# Patient Record
Sex: Male | Born: 1940 | Race: Asian | Hispanic: No | Marital: Married | Smoking: Former smoker
Health system: Southern US, Community
[De-identification: ages and names within clinical notes are randomized; demographics above are authoritative.]

## PROBLEM LIST (undated history)

## (undated) DIAGNOSIS — N319 Neuromuscular dysfunction of bladder, unspecified: Secondary | ICD-10-CM

## (undated) DIAGNOSIS — J449 Chronic obstructive pulmonary disease, unspecified: Secondary | ICD-10-CM

## (undated) DIAGNOSIS — N390 Urinary tract infection, site not specified: Principal | ICD-10-CM

## (undated) DIAGNOSIS — G20A1 Parkinson's disease without dyskinesia, without mention of fluctuations: Secondary | ICD-10-CM

## (undated) DIAGNOSIS — E039 Hypothyroidism, unspecified: Secondary | ICD-10-CM

## (undated) DIAGNOSIS — I10 Essential (primary) hypertension: Secondary | ICD-10-CM

## (undated) DIAGNOSIS — G931 Anoxic brain damage, not elsewhere classified: Secondary | ICD-10-CM

## (undated) DIAGNOSIS — G20 Parkinson's disease: Secondary | ICD-10-CM

## (undated) DIAGNOSIS — E785 Hyperlipidemia, unspecified: Secondary | ICD-10-CM

## (undated) DIAGNOSIS — R8271 Bacteriuria: Secondary | ICD-10-CM

## (undated) HISTORY — DX: Urinary tract infection, site not specified: N39.0

## (undated) HISTORY — PX: APPENDECTOMY: SHX54

## (undated) HISTORY — PX: JOINT REPLACEMENT: SHX530

## (undated) HISTORY — DX: Bacteriuria: R82.71

## (undated) HISTORY — DX: Essential (primary) hypertension: I10

## (undated) HISTORY — DX: Anoxic brain damage, not elsewhere classified: G93.1

## (undated) HISTORY — PX: REPLACEMENT TOTAL KNEE BILATERAL: SUR1225

---

## 2015-12-20 ENCOUNTER — Inpatient Hospital Stay (HOSPITAL_COMMUNITY)
Admission: EM | Admit: 2015-12-20 | Discharge: 2015-12-23 | DRG: 690 | Disposition: A | Discharge status: Home Health | Payer: Medicare Other | Attending: Internal Medicine | Admitting: Internal Medicine

## 2015-12-20 ENCOUNTER — Emergency Department (HOSPITAL_COMMUNITY): Payer: Medicare Other

## 2015-12-20 ENCOUNTER — Encounter (HOSPITAL_COMMUNITY): Payer: Self-pay | Admitting: Emergency Medicine

## 2015-12-20 DIAGNOSIS — E860 Dehydration: Secondary | ICD-10-CM | POA: Diagnosis present

## 2015-12-20 DIAGNOSIS — Z79899 Other long term (current) drug therapy: Secondary | ICD-10-CM | POA: Diagnosis not present

## 2015-12-20 DIAGNOSIS — N319 Neuromuscular dysfunction of bladder, unspecified: Secondary | ICD-10-CM | POA: Diagnosis present

## 2015-12-20 DIAGNOSIS — I959 Hypotension, unspecified: Secondary | ICD-10-CM | POA: Diagnosis present

## 2015-12-20 DIAGNOSIS — B961 Klebsiella pneumoniae [K. pneumoniae] as the cause of diseases classified elsewhere: Secondary | ICD-10-CM | POA: Diagnosis present

## 2015-12-20 DIAGNOSIS — G20A1 Parkinson's disease without dyskinesia, without mention of fluctuations: Secondary | ICD-10-CM | POA: Diagnosis present

## 2015-12-20 DIAGNOSIS — Z87440 Personal history of urinary (tract) infections: Secondary | ICD-10-CM

## 2015-12-20 DIAGNOSIS — G20 Parkinson's disease: Secondary | ICD-10-CM | POA: Diagnosis not present

## 2015-12-20 DIAGNOSIS — Z87891 Personal history of nicotine dependence: Secondary | ICD-10-CM | POA: Diagnosis not present

## 2015-12-20 DIAGNOSIS — R531 Weakness: Secondary | ICD-10-CM

## 2015-12-20 DIAGNOSIS — N390 Urinary tract infection, site not specified: Principal | ICD-10-CM | POA: Diagnosis present

## 2015-12-20 DIAGNOSIS — J449 Chronic obstructive pulmonary disease, unspecified: Secondary | ICD-10-CM | POA: Diagnosis present

## 2015-12-20 DIAGNOSIS — Z7982 Long term (current) use of aspirin: Secondary | ICD-10-CM

## 2015-12-20 DIAGNOSIS — R262 Difficulty in walking, not elsewhere classified: Secondary | ICD-10-CM

## 2015-12-20 DIAGNOSIS — Z96653 Presence of artificial knee joint, bilateral: Secondary | ICD-10-CM | POA: Diagnosis present

## 2015-12-20 HISTORY — DX: Chronic obstructive pulmonary disease, unspecified: J44.9

## 2015-12-20 HISTORY — DX: Parkinson's disease without dyskinesia, without mention of fluctuations: G20.A1

## 2015-12-20 HISTORY — DX: Neuromuscular dysfunction of bladder, unspecified: N31.9

## 2015-12-20 HISTORY — DX: Parkinson's disease: G20

## 2015-12-20 LAB — URINE MICROSCOPIC-ADD ON

## 2015-12-20 LAB — URINALYSIS, ROUTINE W REFLEX MICROSCOPIC
BILIRUBIN URINE: NEGATIVE
Glucose, UA: NEGATIVE mg/dL
HGB URINE DIPSTICK: NEGATIVE
Ketones, ur: NEGATIVE mg/dL
NITRITE: POSITIVE — AB
PROTEIN: NEGATIVE mg/dL
SPECIFIC GRAVITY, URINE: 1.005 (ref 1.005–1.030)
pH: 6 (ref 5.0–8.0)

## 2015-12-20 LAB — COMPREHENSIVE METABOLIC PANEL
ALT: <5 U/L — ABNORMAL LOW (ref 17–63)
ANION GAP: 7 (ref 5–15)
AST: 17 U/L (ref 15–41)
Albumin: 3.6 g/dL (ref 3.5–5.0)
Alkaline Phosphatase: 78 U/L (ref 38–126)
BILIRUBIN TOTAL: 1.2 mg/dL (ref 0.3–1.2)
BUN: 29 mg/dL — AB (ref 6–20)
CALCIUM: 9.3 mg/dL (ref 8.9–10.3)
CO2: 26 mmol/L (ref 22–32)
CREATININE: 1.11 mg/dL (ref 0.61–1.24)
Chloride: 103 mmol/L (ref 101–111)
Glucose, Bld: 91 mg/dL (ref 65–99)
POTASSIUM: 4.4 mmol/L (ref 3.5–5.1)
Sodium: 136 mmol/L (ref 135–145)
TOTAL PROTEIN: 6.8 g/dL (ref 6.5–8.1)

## 2015-12-20 LAB — I-STAT CG4 LACTIC ACID, ED: LACTIC ACID, VENOUS: 0.6 mmol/L (ref 0.5–1.9)

## 2015-12-20 LAB — CBC WITH DIFFERENTIAL/PLATELET
BASOS ABS: 0 10*3/uL (ref 0.0–0.1)
BASOS PCT: 0 %
EOS ABS: 0.3 10*3/uL (ref 0.0–0.7)
Eosinophils Relative: 3 %
HEMATOCRIT: 32.1 % — AB (ref 39.0–52.0)
HEMOGLOBIN: 10.5 g/dL — AB (ref 13.0–17.0)
Lymphocytes Relative: 20 %
Lymphs Abs: 2.1 10*3/uL (ref 0.7–4.0)
MCH: 28.3 pg (ref 26.0–34.0)
MCHC: 32.7 g/dL (ref 30.0–36.0)
MCV: 86.5 fL (ref 78.0–100.0)
MONO ABS: 0.9 10*3/uL (ref 0.1–1.0)
Monocytes Relative: 8 %
NEUTROS ABS: 7.3 10*3/uL (ref 1.7–7.7)
NEUTROS PCT: 69 %
Platelets: 147 10*3/uL — ABNORMAL LOW (ref 150–400)
RBC: 3.71 MIL/uL — ABNORMAL LOW (ref 4.22–5.81)
RDW: 14.7 % (ref 11.5–15.5)
WBC: 10.5 10*3/uL (ref 4.0–10.5)

## 2015-12-20 LAB — CREATININE, SERUM: CREATININE: 0.96 mg/dL (ref 0.61–1.24)

## 2015-12-20 LAB — CBC
HCT: 28.9 % — ABNORMAL LOW (ref 39.0–52.0)
HEMOGLOBIN: 9.7 g/dL — AB (ref 13.0–17.0)
MCH: 28.3 pg (ref 26.0–34.0)
MCHC: 33.6 g/dL (ref 30.0–36.0)
MCV: 84.3 fL (ref 78.0–100.0)
Platelets: 122 10*3/uL — ABNORMAL LOW (ref 150–400)
RBC: 3.43 MIL/uL — AB (ref 4.22–5.81)
RDW: 14.4 % (ref 11.5–15.5)
WBC: 12.2 10*3/uL — AB (ref 4.0–10.5)

## 2015-12-20 MED ORDER — ENOXAPARIN SODIUM 40 MG/0.4ML ~~LOC~~ SOLN
40.0000 mg | SUBCUTANEOUS | Status: DC
  Administered 2015-12-20 – 2015-12-22 (×3): 40 mg via SUBCUTANEOUS
  Filled 2015-12-20 (×3): qty 0.4

## 2015-12-20 MED ORDER — SODIUM CHLORIDE 0.9 % IV BOLUS (SEPSIS)
1000.0000 mL | Freq: Once | INTRAVENOUS | Status: AC
  Administered 2015-12-20: 1000 mL via INTRAVENOUS

## 2015-12-20 MED ORDER — ATORVASTATIN CALCIUM 10 MG PO TABS
20.0000 mg | ORAL_TABLET | Freq: Every day | ORAL | Status: DC
  Administered 2015-12-21 – 2015-12-23 (×3): 20 mg via ORAL
  Filled 2015-12-20 (×2): qty 2
  Filled 2015-12-20: qty 1
  Filled 2015-12-20: qty 2

## 2015-12-20 MED ORDER — LEVOTHYROXINE SODIUM 88 MCG PO TABS
88.0000 ug | ORAL_TABLET | Freq: Every day | ORAL | Status: DC
  Administered 2015-12-21 – 2015-12-23 (×3): 88 ug via ORAL
  Filled 2015-12-20 (×3): qty 1

## 2015-12-20 MED ORDER — CARBIDOPA-LEVODOPA 25-100 MG PO TABS
2.0000 | ORAL_TABLET | Freq: Three times a day (TID) | ORAL | Status: DC
  Administered 2015-12-20 – 2015-12-23 (×8): 2 via ORAL
  Filled 2015-12-20 (×10): qty 2

## 2015-12-20 MED ORDER — SULFAMETHOXAZOLE-TRIMETHOPRIM 400-80 MG/5ML IV SOLN
10.0000 mg/kg/d | Freq: Four times a day (QID) | INTRAVENOUS | Status: DC
  Administered 2015-12-20 – 2015-12-22 (×7): 177.6 mg via INTRAVENOUS
  Filled 2015-12-20 (×8): qty 11.1

## 2015-12-20 MED ORDER — ACETAMINOPHEN 500 MG PO TABS
1000.0000 mg | ORAL_TABLET | Freq: Four times a day (QID) | ORAL | Status: DC | PRN
  Administered 2015-12-22: 1000 mg via ORAL
  Filled 2015-12-20: qty 2

## 2015-12-20 MED ORDER — ASPIRIN EC 81 MG PO TBEC
81.0000 mg | DELAYED_RELEASE_TABLET | Freq: Every day | ORAL | Status: DC
  Administered 2015-12-21 – 2015-12-23 (×3): 81 mg via ORAL
  Filled 2015-12-20 (×4): qty 1

## 2015-12-20 MED ORDER — DOCUSATE SODIUM 100 MG PO CAPS
100.0000 mg | ORAL_CAPSULE | Freq: Two times a day (BID) | ORAL | Status: DC
  Administered 2015-12-21 – 2015-12-23 (×5): 100 mg via ORAL
  Filled 2015-12-20 (×6): qty 1

## 2015-12-20 MED ORDER — FERROUS SULFATE 325 (65 FE) MG PO TABS
325.0000 mg | ORAL_TABLET | Freq: Every day | ORAL | Status: DC
  Administered 2015-12-21 – 2015-12-23 (×3): 325 mg via ORAL
  Filled 2015-12-20 (×3): qty 1

## 2015-12-20 MED ORDER — ADULT MULTIVITAMIN W/MINERALS CH
1.0000 | ORAL_TABLET | Freq: Every day | ORAL | Status: DC
  Administered 2015-12-21 – 2015-12-23 (×3): 1 via ORAL
  Filled 2015-12-20 (×4): qty 1

## 2015-12-20 MED ORDER — SODIUM CHLORIDE 0.9 % IV SOLN: INTRAVENOUS | Status: DC

## 2015-12-20 MED ORDER — ROPINIROLE HCL 1 MG PO TABS
1.0000 mg | ORAL_TABLET | Freq: Every day | ORAL | Status: DC
  Administered 2015-12-20 – 2015-12-22 (×3): 1 mg via ORAL
  Filled 2015-12-20 (×4): qty 1

## 2015-12-20 MED ORDER — TAMSULOSIN HCL 0.4 MG PO CAPS
0.4000 mg | ORAL_CAPSULE | Freq: Every day | ORAL | Status: DC
  Administered 2015-12-20 – 2015-12-23 (×4): 0.4 mg via ORAL
  Filled 2015-12-20 (×3): qty 1

## 2015-12-20 MED ORDER — GABAPENTIN 100 MG PO CAPS
200.0000 mg | ORAL_CAPSULE | Freq: Every day | ORAL | Status: DC
  Administered 2015-12-20 – 2015-12-23 (×4): 200 mg via ORAL
  Filled 2015-12-20 (×4): qty 2

## 2015-12-20 MED ORDER — CITALOPRAM HYDROBROMIDE 20 MG PO TABS
20.0000 mg | ORAL_TABLET | Freq: Every day | ORAL | Status: DC
  Administered 2015-12-20 – 2015-12-23 (×4): 20 mg via ORAL
  Filled 2015-12-20 (×2): qty 1
  Filled 2015-12-20: qty 2
  Filled 2015-12-20: qty 1

## 2015-12-20 MED ORDER — SODIUM CHLORIDE 0.9 % IV SOLN
INTRAVENOUS | Status: AC
  Administered 2015-12-20 – 2015-12-21 (×3): via INTRAVENOUS

## 2015-12-20 MED ORDER — DEXTROSE 5 % IV SOLN
1.0000 g | Freq: Once | INTRAVENOUS | Status: AC
  Administered 2015-12-20: 1 g via INTRAVENOUS
  Filled 2015-12-20: qty 10

## 2015-12-20 MED ORDER — ROPINIROLE HCL 1 MG PO TABS
1.5000 mg | ORAL_TABLET | Freq: Every day | ORAL | Status: DC
  Administered 2015-12-20 – 2015-12-22 (×3): 1.5 mg via ORAL
  Filled 2015-12-20 (×3): qty 1

## 2015-12-20 NOTE — H&P (Signed)
History and Physical  Bryan Vega ZOX:[id number] DOB: 04-Dec-1940 DOA: 12/20/2015  Referring physician: Jeraldine Loots, MD Urologist: Dr. Quintella Vega, [place]  Chief Complaint: weakness  HPI: Bryan Vega is a 75 y.o. male The patient has a notable history of Parkinson's disease with a neurogenic bladder as well as dependent catheterization twice daily for urination. He has a history of multiple UTIs most recently 2 weeks ago where he was treated by his urologist in [facility] Dr Bryan Vega with 10 days of macrobid.  He has a history of MDR e coli infections.  Family states that over the past day he has become less interactive and less verbal.  He also notes increasing generalized weakness, including one event during which the patient was too weak to rise from bed, and slid to the floor.  No trauma and the patient's wife witnessed the entire event, assisting him to the floor.  The patient himself denies head pain, neck pain, chest pain, belly pain, discomfort.   He does acknowledge weakness. He was noted to have low blood pressure in ED and cloudy urine that appears infected. He was started on IVF hydration and antibiotics.  He has a history of severe sepsis from UTI in the past requiring ICU care.  He was initially given ceftriaxone but family reports that it usually does not work for his infections.  Urine culture results from [facility] reveal multi-drug resistant E coli that responded to macrobid and bactrim. He is being admitted for further observation and management given high risk for decompensation.   Review of Systems: All systems reviewed and apart from history of presenting illness, are negative.  Past Medical History:  Diagnosis Date  . COPD (chronic obstructive pulmonary disease) (HCC)   . Parkinson's disease [facility])    Past Surgical History:  Procedure Laterality Date  . APPENDECTOMY    . JOINT REPLACEMENT    . 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 His smoking use included Cigarettes. He has a 10.00 pack-year smoking history. He has never used smokeless tobacco. He reports that he drinks alcohol. He reports that he does not use drugs.   No Known Allergies  History reviewed. No pertinent family history.   Prior to Admission medications   Medication Sig Start Date End Date Taking? Authorizing Provider  acetaminophen (TYLENOL) 500 MG tablet Take 1,000 mg by mouth every 6 (six) hours as needed for fever.   Yes Historical Provider, MD  aspirin EC 81 MG tablet Take 81 mg by mouth daily.   Yes Historical Provider, MD  atorvastatin (LIPITOR) 20 MG tablet Take 20 mg by mouth daily.   Yes Historical Provider, MD  CALCIUM PO Take 1 tablet by mouth daily.   Yes Historical Provider, MD  carbidopa-levodopa (SINEMET IR) 25-100 MG tablet Take 2 tablets by mouth 3 (three) times daily.   Yes Historical Provider, MD  citalopram (CELEXA) 20 MG tablet Take 20 mg by mouth daily.   Yes Historical Provider, MD  docusate sodium (COLACE) 100 MG capsule Take 100 mg by mouth 2 (two) times daily.   Yes Historical Provider, MD  ferrous sulfate 325 (65 FE) MG tablet Take 325 mg by mouth daily with breakfast.   Yes Historical Provider, MD  gabapentin (NEURONTIN) 100 MG capsule Take 200 mg by mouth daily.   Yes Historical Provider, MD  levothyroxine (SYNTHROID, LEVOTHROID) 88 MCG tablet Take 88 mcg by mouth  daily before breakfast.   Yes Historical Provider, MD  Multiple Vitamins-Minerals (MULTIVITAMIN GUMMIES ADULT PO) Take 1 tablet by mouth daily.   Yes Historical Provider, MD  Omega-3 Fatty Acids (FISH OIL PO) Take 1 capsule by mouth daily.   Yes Historical Provider, MD  rOPINIRole (REQUIP) 0.5 MG tablet Take 1-1.5 mg by mouth 2 (two) times daily. Take 1mg  in the evening and 1.5mg s at bedtime   Yes Historical Provider, MD  tamsulosin (FLOMAX) 0.4 MG CAPS capsule Take 0.4 mg by mouth daily.   Yes Historical Provider, MD     Physical Exam: Vitals:   12/20/15 0853 12/20/15 1109 12/20/15 1200 12/20/15 1230  BP:  (!) 98/51 (!) 119/53   Pulse:  67 61 62  Resp:  22 21 21   Temp:  97.8 F (36.6 C)    TempSrc:  Oral    SpO2: 100% 97% 99% 100%  Weight: 71.2 kg (157 lb)     Height:       Constitutional: He is oriented to person, place, and time. He has a chronically ill appearance.  HENT: Head: Normocephalic and atraumatic. MM dry. Eyes: Conjunctivae and EOM are normal.  Cardiovascular: Normal rate and regular rhythm.   Pulmonary/Chest: Effort normal. No stridor. No respiratory distress.  Abdominal: He exhibits no distension.  Musculoskeletal: He exhibits edema. Bilateral LE edema.  Neurological: He is alert and oriented to person, place, and time. He displays atrophy. He displays no tremor. He exhibits abnormal muscle tone.  Skin: Skin is warm and dry.  Psychiatric: He has a flat affect. He is slowed. Cognition and memory are not impaired.    Labs on Admission:  Basic Metabolic Panel:  Recent Labs Lab 12/20/15 1009  NA 136  K 4.4  CL 103  CO2 26  GLUCOSE 91  BUN 29*  CREATININE 1.11  CALCIUM 9.3   Liver Function Tests:  Recent Labs Lab 12/20/15 1009  AST 17  ALT <5*  ALKPHOS 78  BILITOT 1.2  PROT 6.8  ALBUMIN 3.6   No results for input(s): LIPASE, AMYLASE in the last 168 hours. No results for input(s): AMMONIA in the last 168 hours. CBC:  Recent Labs Lab 12/20/15 1009  WBC 10.5  NEUTROABS 7.3  HGB 10.5*  HCT 32.1*  MCV 86.5  PLT 147*   Cardiac Enzymes: No results for input(s): CKTOTAL, CKMB, CKMBINDEX, TROPONINI in the last 168 hours.  BNP (last 3 results) No results for input(s): PROBNP in the last 8760 hours. CBG: No results for input(s): GLUCAP in the last 168 hours.  Radiological Exams on Admission: Dg Chest 2 View  Result Date: 12/20/2015 CLINICAL DATA:  Fever.  Fall 1 day prior EXAM: CHEST  2 VIEW COMPARISON:  None. FINDINGS: Lungs are clear. Heart is upper  normal in size with pulmonary vascularity within normal limits. No pneumothorax. No adenopathy. There is degenerative change in the thoracic spine. IMPRESSION: No edema or consolidation. Electronically Signed   By: Bretta BangWilliam  Woodruff III M.D.   On: 12/20/2015 10:11   EKG: Independently reviewed.  Assessment/Plan Principal Problem:   UTI (lower urinary tract infection) Active Problems:   Hypotension   Generalized weakness   Parkinson disease (HCC)  1. Multidrug-resistant UTI-this is presumed to be a treatable complicated infection given that patient is having symptoms of early sepsis with hypotension and tachycardia. His urine does appear to be infected. Urine culture has been sent. Blood cultures have been ordered. He was given ceftriaxone but after talking with the family I  agree that the ceftriaxone may not be effective here. I reviewed some old urine cultures and this resistant Escherichia coli had been sensitive to Macrobid and Bactrim in the past. He apparently did complete a 10 day course of Macrobid outpatient 2 weeks ago. Will try IV Bactrim pending urine culture results. Continue supportive care with aggressive IV fluid hydration. Lactic acid within normal limits. Blood pressure is improving with IV fluid hydration. 2. Hypotension-likely secondary to dehydration associated with the urinary tract infection. He does not appear to be septic at this time but family is very concerned that he is not himself. He has high risk for sepsis. Follow blood and urine cultures.  3. Parkinson's disease with neurogenic bladder-continue in and out catheterization. Continue home medications for Parkinson's disease. 4. Generalized weakness-suspect this is secondary to #1 above. Treating as noted. Reassess in the morning. 5. Dehydration - IV fluid hydration ordered and clinically improving.   DVT Prophylaxis: enoxaparin Code Status: FULL  Family Communication: bedside daughter, son-in law  Disposition Plan:  Anticipated return to previous home environment in 1-2 days   Standley Dakins, MD [facility] Pager [phone]  If 7PM-7AM, please contact night-coverage [email or web address] Password [email or web address] 12/20/2015, 1:23 PM

## 2015-12-20 NOTE — Care Management Note (Signed)
Case Management Note  Patient Details  Name: Bryan Vega MRN: [id number] Date of Birth: 08/04/1940  Subjective/Objective:                  75 yo male who family states that over the past day he has become less interactive, is verbal. He also notes increasing generalized weakness, including one event during which the patient was too weak to rise from bed, and slid to the floor.  Action/Plan: Follow for disposition needs. /DME hospital bed.   Expected Discharge Date:  12/20/15               Expected Discharge Plan:  Home w Home Health Services  In-House Referral:  NA  Discharge planning Services  CM Consult  Post Acute Care Choice:  Durable Medical Equipment Choice offered to:  Adult Children  DME Arranged:  Hospital bed DME Agency:     HH Arranged:  NA HH Agency:  NA  Status of Service:  Completed, signed off  If discussed at Long Length of Stay Meetings, dates discussed:    Additional Comments: Bryan Cohnamellia [name], RN, BSN, [place] [phone] Pt qualifies for DME hospital bed.  DME  ordered through [facility].  [facility]  EDCM spoke with Dr Jeraldine LootsLockwood to place order for DME hospital bed.  Bryan CohnWood, [name], RN 12/20/2015, 10:52 AM

## 2015-12-20 NOTE — Progress Notes (Signed)
Discussed admission status with admitting MD.  Orders placed

## 2015-12-20 NOTE — ED Triage Notes (Addendum)
Patient is from home.  Family members states patient was Septic 2 months ago.  He is complaining of a fever. Family states he has been sleeping more and progressively getting weaker. Family administered 1000 mg of Tylenol at 07:15 am.    Patient denies n/v/d, and cough. Patient has a history of Parkinson's.  Patient slid of bed (did not hit head) and laid on floor for 5 hours  BP:130/66 P:70 R:20 98% on room air

## 2015-12-20 NOTE — ED Notes (Signed)
Unable to collect labs patient is out of the room 

## 2015-12-20 NOTE — ED Provider Notes (Signed)
[facility] DEPT Provider Note   CSN: [id number] Arrival date & time: 12/20/15  0831     History   Chief Complaint Chief Complaint  Patient presents with  . Fever    HPI Normal Stollings is a 75 y.o. male.  HPI  Patient presents with family members to relate the history of present illness. Patient is Vega, Bryan Vega, Bryan Vega, Bryan Vega, Bryan Vega. The patient has a notable history of Parkinson's disease, as well as dependent catheterization, twice daily for urination. Family states that over the past day he has become less interactive, is verbal. He also notes increasing generalized weakness, including one event during which the patient was too weak to rise from bed, and slid to the floor. No trauma, and the patient's wife witnessed the entire event, assisting him to the floor. The patient himself denies head pain, neck pain, chest pain, belly pain, discomfort. He does acknowledge weakness.  Family states that the patient has recent episode of sepsis, prolonged hospitalization.    Past Medical History:  Diagnosis Date  . COPD (chronic obstructive pulmonary disease) (HCC)   . Parkinson's disease (HCC)     There are no active problems to display for this patient.   Past Surgical History:  Procedure Laterality Date  . APPENDECTOMY    . JOINT REPLACEMENT    . 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 Authorizing Provider  acetaminophen (TYLENOL) 500 MG tablet Take 1,000 mg by mouth every 6 (six) hours as needed for fever.   Yes Historical Provider, MD  aspirin EC 81 MG tablet Take 81 mg by mouth daily.   Yes Historical Provider, MD  atorvastatin (LIPITOR) 20 MG tablet Take 20 mg by mouth daily.   Yes Historical Provider, MD  CALCIUM PO Take 1 tablet by mouth daily.   Yes Historical Provider, MD    carbidopa-levodopa (SINEMET IR) 25-100 MG tablet Take 2 tablets by mouth 3 (three) times daily.   Yes Historical Provider, MD  CITALOPRAM HYDROBROMIDE PO Take 1 tablet by mouth daily.   Yes Historical Provider, MD  docusate sodium (COLACE) 100 MG capsule Take 100 mg by mouth 2 (two) times daily.   Yes Historical Provider, MD  ferrous sulfate 325 (65 FE) MG tablet Take 325 mg by mouth daily with breakfast.   Yes Historical Provider, MD  gabapentin (NEURONTIN) 100 MG capsule Take 200 mg by mouth daily.   Yes Historical Provider, MD  levothyroxine (SYNTHROID, LEVOTHROID) 88 MCG tablet Take 88 mcg by mouth daily before breakfast.   Yes Historical Provider, MD  Multiple Vitamins-Minerals (MULTIVITAMIN GUMMIES ADULT PO) Take 1 tablet by mouth daily.   Yes Historical Provider, MD  Omega-3 Fatty Acids (FISH OIL PO) Take 1 capsule by mouth daily.   Yes Historical Provider, MD  rOPINIRole (REQUIP) 0.5 MG tablet Take 1-1.5 mg by mouth 2 (two) times daily. Take 1mg  in the evening and 1.5mg s at bedtime   Yes Historical Provider, MD  tamsulosin (FLOMAX) 0.4 MG CAPS capsule Take 0.4 mg by mouth daily.   Yes Historical Provider, MD    Family History No family history on file.  Social History Social History  Substance Use Topics  . Smoking status: Former Smoker    Packs/day: 0.50    Years: 20.00    Types: Cigarettes    Quit  date: 04/20/1996  . Smokeless tobacco: Never Used  . Alcohol use Yes     Allergies   Review of patient's allergies indicates no known allergies.   Review of Systems Review of Systems  Constitutional:       Per HPI, otherwise negative  HENT:       Per HPI, otherwise negative  Respiratory:       Per HPI, otherwise negative  Cardiovascular:       Per HPI, otherwise negative  Gastrointestinal: Positive for nausea. Negative for vomiting.  Endocrine:       Negative aside from HPI  Genitourinary:       Neg aside from HPI   Musculoskeletal:       Per HPI, otherwise  negative  Skin: Negative for wound.  Neurological: Positive for weakness. Negative for syncope.  Psychiatric/Behavioral: Positive for decreased concentration.     Physical Exam Updated Vital Signs BP 119/58 (BP Location: Left Arm)   Pulse 79   Temp 98.3 F (36.8 C) (Oral)   Resp 15   Ht 5\' 7"  (1.702 m)   Wt 157 lb (71.2 kg)   SpO2 100%   BMI 24.59 kg/m   Physical Exam  Constitutional: He is oriented to person, place, and time. He has a sickly appearance.  HENT:  Head: Normocephalic and atraumatic.  Eyes: Conjunctivae and EOM are normal.  Cardiovascular: Normal rate and regular rhythm.   Pulmonary/Chest: Effort normal. No stridor. No respiratory distress.  Abdominal: He exhibits no distension.  Musculoskeletal: He exhibits edema.  Bilateral LE edema.   Neurological: He is Bryan Vega and oriented to person, place, and time. He displays atrophy. He displays no tremor. He exhibits abnormal muscle tone.  Skin: Skin is warm and dry.  Psychiatric: He has a normal mood and affect. He is slowed. Cognition and memory are not impaired.  Nursing note and vitals reviewed.    ED Treatments / Results  Labs (all labs ordered are listed, Bryan only abnormal results are displayed) Labs Reviewed  COMPREHENSIVE METABOLIC PANEL - Abnormal; Notable for the following:       Result Value   BUN 29 (*)    ALT <5 (*)    All other components within normal limits  CBC WITH DIFFERENTIAL/PLATELET - Abnormal; Notable for the following:    RBC 3.71 (*)    Hemoglobin 10.5 (*)    HCT 32.1 (*)    Platelets 147 (*)    All other components within normal limits  URINALYSIS, ROUTINE W REFLEX MICROSCOPIC (NOT AT [facility]) - Abnormal; Notable for the following:    APPearance CLOUDY (*)    Nitrite POSITIVE (*)    Leukocytes, UA MODERATE (*)    All other components within normal limits  URINE MICROSCOPIC-ADD ON - Abnormal; Notable for the following:    Squamous Epithelial / LPF 0-5 (*)    Bacteria, UA MANY (*)     All other components within normal limits  URINE CULTURE  CULTURE, BLOOD (ROUTINE X 2)  CULTURE, BLOOD (ROUTINE X 2)  I-STAT CG4 LACTIC ACID, ED    EKG  EKG Interpretation  Date/Time:  Thursday December 20 2015 09:30:08 EDT Ventricular Rate:  70 PR Interval:    QRS Duration: 92 QT Interval:  425 QTC Calculation: 459 R Axis:   0 Text Interpretation:  Sinus rhythm Borderline prolonged PR interval Baseline wander in lead(s) V3 Sinus rhythm Artifact T wave abnormality Abnormal ekg Confirmed by Gerhard Munch  MD ([id number]) on 12/20/2015 9:56:02 AM  Radiology Dg Chest 2 View  Result Date: 12/20/2015 CLINICAL DATA:  Fever.  Fall 1 day prior EXAM: CHEST  2 VIEW COMPARISON:  None. FINDINGS: Lungs are clear. Heart is upper normal in size with pulmonary vascularity within normal limits. No pneumothorax. No adenopathy. There is degenerative change in the thoracic spine. IMPRESSION: No edema or consolidation. Electronically Signed   By: Bretta BangWilliam  Woodruff III M.D.   On: 12/20/2015 10:11    Procedures Procedures (including critical care time)  Medications Ordered in ED Medications  sodium chloride 0.9 % bolus 1,000 mL (not administered)  cefTRIAXone (ROCEPHIN) 1 g in dextrose 5 % 50 mL IVPB (not administered)  sodium chloride 0.9 % bolus 1,000 mL (0 mLs Intravenous Stopped 12/20/15 1200)     Initial Impression / Assessment and Plan / ED Course  I have reviewed the triage vital signs and the nursing notes.  Pertinent labs & imaging results that were available during my care of the patient were reviewed by me and considered in my medical decision making (see chart for details).  Clinical Course   Initial blood pressure low, 80/50, IV fluid started.   1:17 PM On repeat exam pressure has improved, not quite normotensive. I discussed all findings with patient and his 2 family members, and over the phone with his daughter, nurse practitioner. With concern for urinary tract  infection, weakness, hypotension, he will be admitted after initiation of antibiotics.  Chart review notable for no evidence for prior studies, though this is likely a electronic medical record here, as the family states the patient has prior testing, including urine cultures. These results are not available for antibiotic prescription.  Final Clinical Impressions(s) / ED Diagnoses  Elderly male with Parkinson's, neurogenic bladder presents with weakness, is found to be hypotensive. Here the patient's blood pressure begins to improve after fluid resuscitation. Patient is on a urinary tract infection, requiring initiation of IV antibiotics given his history. In addition, given the persistent hypotension, patient admitted for further evaluation and management. The patient had some presenting concerns for altered mental status, these seem likely related to his weakness with no persistent confusion, no asymmetric weakness.    Gerhard Munchobert Dalesha Stanback, MD 12/20/15 1319

## 2015-12-20 NOTE — ED Notes (Signed)
Medications not given yet due to waiting for pharmacy for verification. This RN notified pharmacy and medications to be sent to be given.

## 2015-12-20 NOTE — ED Notes (Signed)
Bed: [id number] Expected date:  Expected time:  Means of arrival:  Comments: EMS- [id number] M, fall/weakness, Hx of Parkinsons

## 2015-12-20 NOTE — Progress Notes (Signed)
EDCM spoke to patient and his wife at bedside.  Patient confirms he lives with his wife.  Patient's wife reports the patient has PT and speeche therapy at home with [facility].  Patient's wife cannot remember the name of patient's pcp.  Patient's wife reports patient had a fever of 101 at home and she provided tylenol.  Patient has a walker that was given to him by a neighbor and patient's wife reports "It's not good."  Patient's wife expressed interest in a hospital bed.  Patient's wife reports she has hired a girl to come once a week to assist patient with ADL's.  Patient's wife reports the plan is for patient to go home at discharge.  EDCM provided patient's [place] [facility].  No further EDCM needs at this time.

## 2015-12-20 NOTE — ED Notes (Signed)
Daily medication has not been given due to pt son-in-law requesting to manage medication. Pharmacy tech called to come speak with family about medications and times due.

## 2015-12-20 NOTE — Progress Notes (Signed)
Utilization Review completed.  Redina Zeller RN CM  

## 2015-12-21 DIAGNOSIS — N319 Neuromuscular dysfunction of bladder, unspecified: Secondary | ICD-10-CM

## 2015-12-21 DIAGNOSIS — N390 Urinary tract infection, site not specified: Principal | ICD-10-CM

## 2015-12-21 DIAGNOSIS — R531 Weakness: Secondary | ICD-10-CM

## 2015-12-21 DIAGNOSIS — G20 Parkinson's disease: Secondary | ICD-10-CM

## 2015-12-21 LAB — COMPREHENSIVE METABOLIC PANEL
ALT: <5 U/L — ABNORMAL LOW (ref 17–63)
AST: 19 U/L (ref 15–41)
Albumin: 2.9 g/dL — ABNORMAL LOW (ref 3.5–5.0)
Alkaline Phosphatase: 65 U/L (ref 38–126)
Anion gap: 3 — ABNORMAL LOW (ref 5–15)
BUN: 19 mg/dL (ref 6–20)
CHLORIDE: 111 mmol/L (ref 101–111)
CO2: 24 mmol/L (ref 22–32)
Calcium: 8.1 mg/dL — ABNORMAL LOW (ref 8.9–10.3)
Creatinine, Ser: 0.86 mg/dL (ref 0.61–1.24)
Glucose, Bld: 73 mg/dL (ref 65–99)
POTASSIUM: 4.1 mmol/L (ref 3.5–5.1)
SODIUM: 138 mmol/L (ref 135–145)
Total Bilirubin: 0.4 mg/dL (ref 0.3–1.2)
Total Protein: 5.7 g/dL — ABNORMAL LOW (ref 6.5–8.1)

## 2015-12-21 LAB — CBC
HEMATOCRIT: 27.6 % — AB (ref 39.0–52.0)
Hemoglobin: 9.1 g/dL — ABNORMAL LOW (ref 13.0–17.0)
MCH: 28.8 pg (ref 26.0–34.0)
MCHC: 33 g/dL (ref 30.0–36.0)
MCV: 87.3 fL (ref 78.0–100.0)
Platelets: 131 10*3/uL — ABNORMAL LOW (ref 150–400)
RBC: 3.16 MIL/uL — AB (ref 4.22–5.81)
RDW: 14.8 % (ref 11.5–15.5)
WBC: 8.4 10*3/uL (ref 4.0–10.5)

## 2015-12-21 MED ORDER — IPRATROPIUM-ALBUTEROL 0.5-2.5 (3) MG/3ML IN SOLN
3.0000 mL | Freq: Four times a day (QID) | RESPIRATORY_TRACT | Status: DC
  Administered 2015-12-21 (×3): 3 mL via RESPIRATORY_TRACT
  Filled 2015-12-21 (×4): qty 3

## 2015-12-21 MED ORDER — IPRATROPIUM-ALBUTEROL 0.5-2.5 (3) MG/3ML IN SOLN: 3.0000 mL | RESPIRATORY_TRACT | Status: DC | PRN

## 2015-12-21 NOTE — Progress Notes (Signed)
PROGRESS NOTE    Bryan Vega  ZOX:[id number] DOB: 1941/01/26 DOA: 12/20/2015 PCP: No primary care provider on file.    Brief Narrative:  75yo with hx of neurogenic bladder presents with recurrent UTI.  Assessment & Plan:   Principal Problem:   UTI (lower urinary tract infection) Active Problems:   Hypotension   Generalized weakness   Parkinson disease (HCC)   Neurogenic bladder   1. Multidrug-resistant UTI-  1. Admitting physican reviewed old urine cultures and this resistant Escherichia coli had been sensitive to Macrobid and Bactrim in the past. Patient reportedly completed a 10 day course of Macrobid outpatient 2 weeks prior to admission.  2. Patient continued on IV Bactrim pending urine culture results. 3. This AM, patient's family reports some clinical improvement 4. Leukocytosis normalized 5. Urine culture reviewed and is demonstrating gram neg rods 2. Hypotension-likely secondary to dehydration associated with the urinary tract infection. H 1. Blood pressure this AM stable 3. Parkinson's disease with neurogenic bladder- 1. continue in and out catheterization.  2. Continue home medications for Parkinson's disease. 3. Patient followed by Urology as outpatient 4. Generalized weakness-suspect this is secondary to #1 above. Treating as noted. Reassess in the morning. 5. Dehydration -  1. Improved with IVF.  DVT prophylaxis: Lovenox Code Status: Full Family Communication: Pt in room, family at bedside Disposition Plan: Uncertain at this time  Consultants:     Procedures:     Antimicrobials: Anti-infectives    Start     Dose/Rate Route Frequency Ordered Stop   12/20/15 1530  sulfamethoxazole-trimethoprim (BACTRIM) 177.6 mg in dextrose 5 % 250 mL IVPB     10 mg/kg/day  71.2 kg 261.1 mL/hr over 60 Minutes Intravenous Every 6 hours 12/20/15 1521     12/20/15 1300  cefTRIAXone (ROCEPHIN) 1 g in dextrose 5 % 50 mL IVPB     1 g 100 mL/hr over 30 Minutes  Intravenous  Once 12/20/15 1255 12/20/15 1413       Subjective: Feels better today  Objective: Vitals:   12/20/15 2110 12/21/15 0534 12/21/15 1325 12/21/15 1430  BP: (!) 146/65 (!) 99/56 (!) 101/54   Pulse: 86 72 63   Resp: (!) 21 (!) 22 (!) 22   Temp: 98.5 F (36.9 C) 99 F (37.2 C) 98 F (36.7 C)   TempSrc: Oral Oral Oral   SpO2: 99% 96% 100% 99%  Weight:      Height:        Intake/Output Summary (Last 24 hours) at 12/21/15 1604 Last data filed at 12/21/15 1500  Gross per 24 hour  Intake          4165.65 ml  Output                0 ml  Net          4165.65 ml   Filed Weights   12/20/15 0853  Weight: 71.2 kg (157 lb)    Examination:  General exam: Appears calm and comfortable  Respiratory system: Clear to auscultation. Respiratory effort normal. Cardiovascular system: S1 & S2 heard, RRR. No JVD, murmurs, rubs, gallops or clicks. No pedal edema. Gastrointestinal system: Abdomen is nondistended, soft and nontender. No organomegaly or masses felt. Normal bowel sounds heard. Central nervous system: Alert and oriented. No focal neurological deficits. Extremities: Symmetric 5 x 5 power. Skin: No rashes, lesions or ulcers Psychiatry: Judgement and insight appear normal. Mood & affect appropriate.   Data Reviewed: I have personally reviewed following labs and imaging studies  CBC:  Recent Labs Lab 12/20/15 1009 12/20/15 1550 12/21/15 0544  WBC 10.5 12.2* 8.4  NEUTROABS 7.3  --   --   HGB 10.5* 9.7* 9.1*  HCT 32.1* 28.9* 27.6*  MCV 86.5 84.3 87.3  PLT 147* 122* 131*   Basic Metabolic Panel:  Recent Labs Lab 12/20/15 1009 12/20/15 1550 12/21/15 0544  NA 136  --  138  K 4.4  --  4.1  CL 103  --  111  CO2 26  --  24  GLUCOSE 91  --  73  BUN 29*  --  19  CREATININE 1.11 0.96 0.86  CALCIUM 9.3  --  8.1*   GFR: Estimated Creatinine Clearance: 68.3 mL/min (by C-G formula based on SCr of 0.86 mg/dL). Liver Function Tests:  Recent Labs Lab  12/20/15 1009 12/21/15 0544  AST 17 19  ALT <5* <5*  ALKPHOS 78 65  BILITOT 1.2 0.4  PROT 6.8 5.7*  ALBUMIN 3.6 2.9*   No results for input(s): LIPASE, AMYLASE in the last 168 hours. No results for input(s): AMMONIA in the last 168 hours. Coagulation Profile: No results for input(s): INR, PROTIME in the last 168 hours. Cardiac Enzymes: No results for input(s): CKTOTAL, CKMB, CKMBINDEX, TROPONINI in the last 168 hours. BNP (last 3 results) No results for input(s): PROBNP in the last 8760 hours. HbA1C: No results for input(s): HGBA1C in the last 72 hours. CBG: No results for input(s): GLUCAP in the last 168 hours. Lipid Profile: No results for input(s): CHOL, HDL, LDLCALC, TRIG, CHOLHDL, LDLDIRECT in the last 72 hours. Thyroid Function Tests: No results for input(s): TSH, T4TOTAL, FREET4, T3FREE, THYROIDAB in the last 72 hours. Anemia Panel: No results for input(s): VITAMINB12, FOLATE, FERRITIN, TIBC, IRON, RETICCTPCT in the last 72 hours. Sepsis Labs:  Recent Labs Lab 12/20/15 1020  LATICACIDVEN 0.60    Recent Results (from the past 240 hour(s))  Urine culture     Status: Abnormal (Preliminary result)   Collection Time: 12/20/15 11:41 AM  Result Value Ref Range Status   Specimen Description URINE, CATHETERIZED  Final   Special Requests NONE  Final   Culture (A)  Final    >=100,000 COLONIES/mL KLEBSIELLA OXYTOCA SUSCEPTIBILITIES TO FOLLOW Performed at [facility]    Report Status PENDING  Incomplete  Culture, blood (Routine X 2) w Reflex to ID Panel     Status: None (Preliminary result)   Collection Time: 12/20/15  1:18 PM  Result Value Ref Range Status   Specimen Description BLOOD LEFT HAND  Final   Special Requests BOTTLES DRAWN AEROBIC AND ANAEROBIC  Final   Culture   Final    NO GROWTH < 24 HOURS Performed at [facility]    Report Status PENDING  Incomplete  Culture, blood (Routine X 2) w Reflex to ID Panel     Status: None (Preliminary  result)   Collection Time: 12/20/15  1:23 PM  Result Value Ref Range Status   Specimen Description BLOOD RIGHT ANTECUBITAL  Final   Special Requests BOTTLES DRAWN AEROBIC ONLY  Final   Culture   Final    NO GROWTH < 24 HOURS Performed at [facility]    Report Status PENDING  Incomplete     Radiology Studies: Dg Chest 2 View  Result Date: 12/20/2015 CLINICAL DATA:  Fever.  Fall 1 day prior EXAM: CHEST  2 VIEW COMPARISON:  None. FINDINGS: Lungs are clear. Heart is upper normal in size with pulmonary vascularity  within normal limits. No pneumothorax. No adenopathy. There is degenerative change in the thoracic spine. IMPRESSION: No edema or consolidation. Electronically Signed   By: Bretta BangWilliam  Woodruff III M.D.   On: 12/20/2015 10:11    Scheduled Meds: . aspirin EC  81 mg Oral Daily  . atorvastatin  20 mg Oral Daily  . carbidopa-levodopa  2 tablet Oral TID  . citalopram  20 mg Oral Daily  . docusate sodium  100 mg Oral BID  . enoxaparin (LOVENOX) injection  40 mg Subcutaneous Q24H  . ferrous sulfate  325 mg Oral Q breakfast  . gabapentin  200 mg Oral Daily  . ipratropium-albuterol  3 mL Nebulization Q6H  . levothyroxine  88 mcg Oral QAC breakfast  . multivitamin with minerals  1 tablet Oral Daily  . rOPINIRole  1 mg Oral QPC supper  . rOPINIRole  1.5 mg Oral QHS  . sulfamethoxazole-trimethoprim  10 mg/kg/day Intravenous Q6H  . tamsulosin  0.4 mg Oral Daily   Continuous Infusions: . sodium chloride 125 mL/hr at 12/21/15 1045     LOS: 1 day   Dajohn Ellender, Scheryl MartenSTEPHEN K, MD [facility] Pager [phone]  If 7PM-7AM, please contact night-coverage [email or web address] Password [email or web address] 12/21/2015, 4:04 PM

## 2015-12-21 NOTE — Progress Notes (Addendum)
[id number]/Rhonda Davis,BSN,RN3,CCM:  Spoke with the patient's wife will need rolling walker and hospital bed at home.  Message left for md to enter orders for with [id number]. 1352/tcf-Jermaine with [facility]-notice of need for bed and walker received.

## 2015-12-21 NOTE — Evaluation (Signed)
Physical Therapy Evaluation Patient Details Name: Bryan Vega MRN: [id number] DOB: 07/11/1940 Today's Date: 12/21/2015   History of Present Illness  75 y.o. male admitted with UTI. H/o Parkinsons, neurogenic bladder  Clinical Impression  Pt admitted with above diagnosis. Pt currently with functional limitations due to the deficits listed below (see PT Problem List). Pt ambulated 150' with min/guard assistance, he had several episodes of parkinsonian type freezing, however hadn't had sinemet yet this morning.  Instructed pt in postural exercises to be done independently. Pt will benefit from skilled PT to increase their independence and safety with mobility to allow discharge to the venue listed below.       Follow Up Recommendations Home health PT    Equipment Recommendations  Rolling Walker    Recommendations for Other Services Speech consult;OT consult     Precautions / Restrictions Precautions Precautions: Fall Precaution Comments: slid to floor PTA, denies other falls in past year Restrictions Weight Bearing Restrictions: No      Mobility  Bed Mobility               General bed mobility comments: up in recliner  Transfers Overall transfer level: Needs assistance Equipment used: Rolling walker (2 wheeled) Transfers: Sit to/from Stand Sit to Stand: Min assist         General transfer comment: min A to rise, used armrests  Ambulation/Gait Ambulation/Gait assistance: Min guard Ambulation Distance (Feet): 150 Feet Assistive device: Rolling walker (2 wheeled) Gait Pattern/deviations: Step-through pattern;Decreased step length - right;Decreased step length - left Gait velocity: decr Gait velocity interpretation: Below normal speed for age/gender General Gait Details: forward head, decr step length, a few episodes of Parkinsonian type freezing esp with turns (pt had not had sinemet yet today, requested it, RN administered it at end of session), no  LOB  Stairs            Wheelchair Mobility    Modified Rankin (Stroke Patients Only)       Balance Overall balance assessment: Needs assistance   Sitting balance-Leahy Scale: Good       Standing balance-Leahy Scale: Fair                               Pertinent Vitals/Pain Pain Assessment: No/denies pain    Home Living Family/patient expects to be discharged to:: Private residence Living Arrangements: Spouse/significant other;Children Available Help at Discharge: Family;Available 24 hours/day Type of Home: House Home Access: Stairs to enter   Enter[employer] CorporationEntrance Stairs-Number of Steps: 1 Home Layout: Able to live on main level with bedroom/bathroom Home Equipment: Walker - 2 wheels;Shower seat;Grab bars - tub/shower Additional Comments: RW is old and doesn't function well -pt requesting new one    Prior Function Level of Independence: Needs assistance   Gait / Transfers Assistance Needed: assist for sit to stand, walks with RW  ADL's / Homemaking Assistance Needed: assist for showering,uses shower seat, has aide         Hand Dominance        Extremity/Trunk Assessment   Upper Extremity Assessment: Overall WFL for tasks assessed           Lower Extremity Assessment: RLE deficits/detail RLE Deficits / Details: knee ext -10* AROM, ankle DF 0* AROM, strength grossly 4/5    Cervical / Trunk Assessment: Other exceptions  Communication   Communication: Expressive difficulties (slurred speech)  Cognition Arousal/Alertness: Awake/alert Behavior During Therapy: WFL for tasks assessed/performed Overall Cognitive  Status: Within Functional Limits for tasks assessed                      General Comments      Exercises Other Exercises Other Exercises: neck extension and rotation to R and L AROM x 5 each sitting   Assessment/Plan    PT Assessment Patient needs continued PT services  PT Problem List Decreased range of motion;Decreased  activity tolerance;Decreased balance;Decreased mobility;Decreased knowledge of use of DME          PT Treatment Interventions Gait training;Functional mobility training;DME instruction;Therapeutic activities;Therapeutic exercise;Patient/family education    PT Goals (Current goals can be found in the Care Plan section)  Acute Rehab PT Goals Patient Stated Goal: to get stronger, walk better PT Goal Formulation: With patient/family Time For Goal Achievement: 01/04/16 Potential to Achieve Goals: Fair    Frequency Min 3X/week   Barriers to discharge        Co-evaluation               End of Session Equipment Utilized During Treatment: Gait belt Activity Tolerance: Patient tolerated treatment well Patient left: in chair;with call bell/phone within reach;with family/visitor present Nurse Communication: Mobility status         Time: 1010-1046 PT Time Calculation (min) (ACUTE ONLY): 36 min   Charges:   PT Evaluation $PT Eval Low Complexity: 1 Procedure PT Treatments $Gait Training: 8-22 mins   PT G Codes:        Tamala Ser 12/21/2015, 11:00 AM [phone]

## 2015-12-22 ENCOUNTER — Encounter (HOSPITAL_COMMUNITY): Payer: Self-pay

## 2015-12-22 LAB — BASIC METABOLIC PANEL
ANION GAP: 5 (ref 5–15)
BUN: 13 mg/dL (ref 6–20)
CALCIUM: 8.5 mg/dL — AB (ref 8.9–10.3)
CO2: 24 mmol/L (ref 22–32)
CREATININE: 0.92 mg/dL (ref 0.61–1.24)
Chloride: 108 mmol/L (ref 101–111)
Glucose, Bld: 110 mg/dL — ABNORMAL HIGH (ref 65–99)
Potassium: 4.2 mmol/L (ref 3.5–5.1)
SODIUM: 137 mmol/L (ref 135–145)

## 2015-12-22 LAB — URINE CULTURE: Culture: >=100000 — AB

## 2015-12-22 LAB — CBC
HCT: 25.9 % — ABNORMAL LOW (ref 39.0–52.0)
Hemoglobin: 8.6 g/dL — ABNORMAL LOW (ref 13.0–17.0)
MCH: 27.7 pg (ref 26.0–34.0)
MCHC: 33.2 g/dL (ref 30.0–36.0)
MCV: 83.3 fL (ref 78.0–100.0)
PLATELETS: 130 10*3/uL — AB (ref 150–400)
RBC: 3.11 MIL/uL — AB (ref 4.22–5.81)
RDW: 14.7 % (ref 11.5–15.5)
WBC: 5.9 10*3/uL (ref 4.0–10.5)

## 2015-12-22 MED ORDER — SACCHAROMYCES BOULARDII 250 MG PO CAPS
250.0000 mg | ORAL_CAPSULE | Freq: Two times a day (BID) | ORAL | Status: DC
  Administered 2015-12-22 – 2015-12-23 (×3): 250 mg via ORAL
  Filled 2015-12-22 (×3): qty 1

## 2015-12-22 MED ORDER — IPRATROPIUM-ALBUTEROL 0.5-2.5 (3) MG/3ML IN SOLN
3.0000 mL | Freq: Three times a day (TID) | RESPIRATORY_TRACT | Status: DC
  Administered 2015-12-22 – 2015-12-23 (×4): 3 mL via RESPIRATORY_TRACT
  Filled 2015-12-22 (×4): qty 3

## 2015-12-22 MED ORDER — POLYETHYLENE GLYCOL 3350 17 G PO PACK
17.0000 g | PACK | Freq: Every day | ORAL | Status: DC | PRN
  Administered 2015-12-22: 17 g via ORAL
  Filled 2015-12-22: qty 1

## 2015-12-22 MED ORDER — CEFUROXIME AXETIL 500 MG PO TABS
500.0000 mg | ORAL_TABLET | Freq: Two times a day (BID) | ORAL | Status: DC
  Administered 2015-12-22 – 2015-12-23 (×2): 500 mg via ORAL
  Filled 2015-12-22 (×4): qty 1

## 2015-12-22 NOTE — Progress Notes (Signed)
PROGRESS NOTE    Bryan Vega  ZOX:[id number] DOB: 1940-09-14 DOA: 12/20/2015 PCP: No primary care provider on file.    Brief Narrative:  75yo with hx of neurogenic bladder presents with recurrent UTI.  Assessment & Plan:   Principal Problem:   UTI (lower urinary tract infection) Active Problems:   Hypotension   Generalized weakness   Parkinson disease (HCC)   Neurogenic bladder   1. UTI secondary to multidrug sensitive Klebsiella 1. Admitting physican reviewed old urine cultures with history of resistant Escherichia coli that had been sensitive to Macrobid and Bactrim in the past. Patient reportedly failed a 10 day course of Macrobid outpatient 2 weeks prior to admission.  2. Patient had been continued on IV Bactrim empirically 3. Patient has demonstrated good clinical improvement thus far 4. Leukocytosis has normalized 5. Urine culture has demonstrated multidrug sensitive Klebsiella species 6. Have transitioned antibiotics to Ceftin per culture results 7. Family has requested follow-up with infectious disease as outpatient regarding recurrent urinary tract infections. I have discussed case with infectious disease on-call who will arrange follow-up. 2. Hypotension-likely secondary to dehydration associated with the urinary tract infection. H 1. Blood pressure stable 3. Parkinson's disease with neurogenic bladder- 1. continue in and out catheterization as needed.  2. Plan to continue home medications for Parkinson's disease. 3. Patient is followed by Urology as outpatient  4. Generalized weakness-suspect this is secondary to #1 above.  1. Continue plan per above with antibiotics 5. Dehydration -  1. Dehydration has improved with IVF.  DVT prophylaxis: Lovenox Code Status: Full Family Communication: Pt in room, family at bedside Disposition Plan: Uncertain at this time  Consultants:     Procedures:     Antimicrobials: Anti-infectives    Start     Dose/Rate Route  Frequency Ordered Stop   12/22/15 1700  cefUROXime (CEFTIN) tablet 500 mg     500 mg Oral 2 times daily with meals 12/22/15 0952     12/20/15 1530  sulfamethoxazole-trimethoprim (BACTRIM) 177.6 mg in dextrose 5 % 250 mL IVPB  Status:  Discontinued     10 mg/kg/day  71.2 kg 261.1 mL/hr over 60 Minutes Intravenous Every 6 hours 12/20/15 1521 12/22/15 0952   12/20/15 1300  cefTRIAXone (ROCEPHIN) 1 g in dextrose 5 % 50 mL IVPB     1 g 100 mL/hr over 30 Minutes Intravenous  Once 12/20/15 1255 12/20/15 1413      Subjective: Patient reports feeling better  Objective: Vitals:   12/22/15 0549 12/22/15 0817 12/22/15 1413 12/22/15 1500  BP: (!) 137/53   (!) 113/51  Pulse: 70   89  Resp: 20   18  Temp: 98 F (36.7 C)   97.7 F (36.5 C)  TempSrc: Oral   Oral  SpO2: 99% 100% 99% 100%  Weight:      Height:        Intake/Output Summary (Last 24 hours) at 12/22/15 1647 Last data filed at 12/22/15 1300  Gross per 24 hour  Intake              440 ml  Output              700 ml  Net             -260 ml   Filed Weights   12/20/15 0853  Weight: 71.2 kg (157 lb)    Examination:  General exam: Sitting in chair, no acute distress  Respiratory system: Normal respiratory effort, no wheezing Cardiovascular system: Regular  rate rhythm, S1-S2 Gastrointestinal system: Soft, no organomegaly, positive bowel sounds Central nervous system: Cranial nerves II through XII grossly intact, no tremors Extremities: Perfused, no joint deformities. Skin: Normal skin turgor, no pallor Psychiatry: Normal mood, no auditory hallucinations  Data Reviewed: I have personally reviewed following labs and imaging studies  CBC:  Recent Labs Lab 12/20/15 1009 12/20/15 1550 12/21/15 0544 12/22/15 0555  WBC 10.5 12.2* 8.4 5.9  NEUTROABS 7.3  --   --   --   HGB 10.5* 9.7* 9.1* 8.6*  HCT 32.1* 28.9* 27.6* 25.9*  MCV 86.5 84.3 87.3 83.3  PLT 147* 122* 131* 130*   Basic Metabolic Panel:  Recent Labs Lab  12/20/15 1009 12/20/15 1550 12/21/15 0544 12/22/15 0555  NA 136  --  138 137  K 4.4  --  4.1 4.2  CL 103  --  111 108  CO2 26  --  24 24  GLUCOSE 91  --  73 110*  BUN 29*  --  19 13  CREATININE 1.11 0.96 0.86 0.92  CALCIUM 9.3  --  8.1* 8.5*   GFR: Estimated Creatinine Clearance: 63.9 mL/min (by C-G formula based on SCr of 0.92 mg/dL). Liver Function Tests:  Recent Labs Lab 12/20/15 1009 12/21/15 0544  AST 17 19  ALT <5* <5*  ALKPHOS 78 65  BILITOT 1.2 0.4  PROT 6.8 5.7*  ALBUMIN 3.6 2.9*   No results for input(s): LIPASE, AMYLASE in the last 168 hours. No results for input(s): AMMONIA in the last 168 hours. Coagulation Profile: No results for input(s): INR, PROTIME in the last 168 hours. Cardiac Enzymes: No results for input(s): CKTOTAL, CKMB, CKMBINDEX, TROPONINI in the last 168 hours. BNP (last 3 results) No results for input(s): PROBNP in the last 8760 hours. HbA1C: No results for input(s): HGBA1C in the last 72 hours. CBG: No results for input(s): GLUCAP in the last 168 hours. Lipid Profile: No results for input(s): CHOL, HDL, LDLCALC, TRIG, CHOLHDL, LDLDIRECT in the last 72 hours. Thyroid Function Tests: No results for input(s): TSH, T4TOTAL, FREET4, T3FREE, THYROIDAB in the last 72 hours. Anemia Panel: No results for input(s): VITAMINB12, FOLATE, FERRITIN, TIBC, IRON, RETICCTPCT in the last 72 hours. Sepsis Labs:  Recent Labs Lab 12/20/15 1020  LATICACIDVEN 0.60    Recent Results (from the past 240 hour(s))  Urine culture     Status: Abnormal   Collection Time: 12/20/15 11:41 AM  Result Value Ref Range Status   Specimen Description URINE, CATHETERIZED  Final   Special Requests NONE  Final   Culture >=100,000 COLONIES/mL KLEBSIELLA OXYTOCA (A)  Final   Report Status 12/22/2015 FINAL  Final   Organism ID, Bacteria KLEBSIELLA OXYTOCA (A)  Final      Susceptibility   Klebsiella oxytoca - MIC*    AMPICILLIN >=32 RESISTANT Resistant     CEFAZOLIN 8  SENSITIVE Sensitive     CEFTRIAXONE <=1 SENSITIVE Sensitive     CIPROFLOXACIN <=0.25 SENSITIVE Sensitive     GENTAMICIN <=1 SENSITIVE Sensitive     IMIPENEM <=0.25 SENSITIVE Sensitive     NITROFURANTOIN 32 SENSITIVE Sensitive     TRIMETH/SULFA <=20 SENSITIVE Sensitive     AMPICILLIN/SULBACTAM 8 SENSITIVE Sensitive     PIP/TAZO <=4 SENSITIVE Sensitive     Extended ESBL NEGATIVE Sensitive     * >=100,000 COLONIES/mL KLEBSIELLA OXYTOCA  Culture, blood (Routine X 2) w Reflex to ID Panel     Status: None (Preliminary result)   Collection Time: 12/20/15  1:18 PM  Result Value Ref Range Status   Specimen Description BLOOD LEFT HAND  Final   Special Requests BOTTLES DRAWN AEROBIC AND ANAEROBIC 5ML  Final   Culture   Final    NO GROWTH 2 DAYS [facility]    Report Status PENDING  Incomplete  Culture, blood (Routine X 2) w Reflex to ID Panel     Status: None (Preliminary result)   Collection Time: 12/20/15  1:23 PM  Result Value Ref Range Status   Specimen Description BLOOD RIGHT ANTECUBITAL  Final   Special Requests BOTTLES DRAWN AEROBIC ONLY 5ML  Final   Culture   Final    NO GROWTH 2 DAYS [facility]    Report Status PENDING  Incomplete     Radiology Studies: No results found.  Scheduled Meds: . aspirin EC  81 mg Oral Daily  . atorvastatin  20 mg Oral Daily  . carbidopa-levodopa  2 tablet Oral TID  . cefUROXime  500 mg Oral BID WC  . citalopram  20 mg Oral Daily  . docusate sodium  100 mg Oral BID  . enoxaparin (LOVENOX) injection  40 mg Subcutaneous Q24H  . ferrous sulfate  325 mg Oral Q breakfast  . gabapentin  200 mg Oral Daily  . ipratropium-albuterol  3 mL Nebulization TID  . levothyroxine  88 mcg Oral QAC breakfast  . multivitamin with minerals  1 tablet Oral Daily  . rOPINIRole  1 mg Oral QPC supper  . rOPINIRole  1.5 mg Oral QHS  . saccharomyces boulardii  250 mg Oral BID  . tamsulosin  0.4 mg Oral Daily   Continuous  Infusions:     LOS: 2 days   CHIU, Scheryl MartenSTEPHEN K, MD [facility] Pager [phone]  If 7PM-7AM, please contact night-coverage [email or web address] Password [email or web address] 12/22/2015, 4:47 PM

## 2015-12-23 LAB — BASIC METABOLIC PANEL
ANION GAP: 6 (ref 5–15)
BUN: 12 mg/dL (ref 6–20)
CALCIUM: 8.7 mg/dL — AB (ref 8.9–10.3)
CO2: 24 mmol/L (ref 22–32)
Chloride: 107 mmol/L (ref 101–111)
Creatinine, Ser: 1.24 mg/dL (ref 0.61–1.24)
GFR calc non Af Amer: 55 mL/min — ABNORMAL LOW (ref >60)
Glucose, Bld: 112 mg/dL — ABNORMAL HIGH (ref 65–99)
POTASSIUM: 4.3 mmol/L (ref 3.5–5.1)
Sodium: 137 mmol/L (ref 135–145)

## 2015-12-23 LAB — CBC
HCT: 25.7 % — ABNORMAL LOW (ref 39.0–52.0)
HEMOGLOBIN: 8.5 g/dL — AB (ref 13.0–17.0)
MCH: 28.5 pg (ref 26.0–34.0)
MCHC: 33.1 g/dL (ref 30.0–36.0)
MCV: 86.2 fL (ref 78.0–100.0)
Platelets: 151 10*3/uL (ref 150–400)
RBC: 2.98 MIL/uL — AB (ref 4.22–5.81)
RDW: 14.9 % (ref 11.5–15.5)
WBC: 4.6 10*3/uL (ref 4.0–10.5)

## 2015-12-23 MED ORDER — SENNA 8.6 MG PO TABS
1.0000 | ORAL_TABLET | Freq: Every day | ORAL | Status: DC
Start: 2015-12-23 — End: 2015-12-23
  Administered 2015-12-23: 8.6 mg via ORAL
  Filled 2015-12-23: qty 1

## 2015-12-23 MED ORDER — CEFUROXIME AXETIL 500 MG PO TABS: 500.0000 mg | ORAL_TABLET | Freq: Two times a day (BID) | ORAL | 0 refills | Status: DC

## 2015-12-23 MED ORDER — SACCHAROMYCES BOULARDII 250 MG PO CAPS: 250.0000 mg | ORAL_CAPSULE | Freq: Two times a day (BID) | ORAL | 0 refills | Status: DC

## 2015-12-23 NOTE — Care Management Note (Signed)
Case Management Note  Patient Details  Name: Bryan Vega MRN: [id number] Date of Birth: [birth year]  Subjective/Objective:        Parkinson's Disease, Neurogenic Bladder            Action/Plan: Discharge Planning: AVS reviewed:  NCM spoke to pt and wife at bedside. Wife states pt had HH at home with [facility]. Contacted [facility] oncall RN with resumption of care. [phone]. Pt requested RW with seat and hospital bed. Spoke to attending for orders. [facility] with new orders for hospital bed and RW with seat.    Expected Discharge Date:  12/23/2015            Expected Discharge Plan:  Home w Home Health Services  In-House Referral:  NA  Discharge planning Services  CM Consult  Post Acute Care Choice:  Durable Medical Equipment Choice offered to:  Adult Children  DME Arranged:  Hospital bed, Walker rolling with seat DME Agency:  [facility]  HH Arranged:  PT, OT, Speech Therapy [facility]  Status of Service:  Completed, signed off  If discussed at Long Length of Stay Meetings, dates discussed:    Additional Comments:  Elliot CousinShavis, Siria Calandro Ellen, RN 12/23/2015, 2:59 PM

## 2015-12-23 NOTE — Discharge Instructions (Signed)
Recommend repeat basic metabolic panel with your PCP in 1 week

## 2015-12-23 NOTE — Care Management Important Message (Signed)
Important Message  Patient Details  Name: Bryan Vega MRN: [id number] Date of Birth: 08-Aug-1940   Medicare Important Message Given:  Yes    Elliot CousinShavis, Drisana Schweickert Ellen, RN 12/23/2015, 2:16 PM

## 2015-12-23 NOTE — Discharge Summary (Signed)
Physician Discharge Summary  Bryan Vega WGN:[id number] DOB: [birth year] DOA: 12/20/2015  PCP: No primary care provider on file.  Admit date: 12/20/2015 Discharge date: 12/23/2015  Admitted From: Home Disposition:  Home  Recommendations for Outpatient Follow-up:  1. Follow up with PCP in 1-2 weeks 2. Please obtain BMP in one week  Discharge Condition:Improved CODE STATUS:Full Diet recommendation: Regular   Brief/Interim Summary: Please review dictated H and P from 9/28 for details. Briefly, 75yo with hx of neurogenic bladder presents with recurrent UTI.   1. UTI secondary to multidrug sensitive Klebsiella 1. Admitting physican reviewed old urine cultures with history of resistant Escherichia coli that had been sensitive to Macrobid and Bactrim in the past. Patient reportedly failed a 10 day course of Macrobid outpatient 2 weeks prior to admission.  2. Patient had been continued on IV Bactrim empirically 3. Patient has demonstrated good clinical improvement thus far 4. Leukocytosis has normalized 5. Urine culture has demonstrated multidrug sensitive Klebsiella species 6. Have transitioned antibiotics to Ceftin per culture results. Patient to complete course on discharge 7. Family has requested follow-up with infectious disease as outpatient regarding recurrent urinary tract infections. I have discussed case with infectious disease on-call who will arrange follow-up. 2. Hypotension-likely secondary to dehydration associated with the urinary tract infection. H 1. Blood pressure remained stable this admission 3. Parkinson's disease with neurogenic bladder- 1. Patient to continue in and out catheterization as needed.  2. Plan to continue home medications for Parkinson's disease. 3. Arrange home health SLP 4. Patient is followed by Urology as outpatient  4. Generalized weakness-suspect this is secondary to #1 above.  1. Continue plan per above with antibiotics 2. Arrange home health  PT 5. Dehydration -  1. Dehydration has improved with IVF.  Discharge Diagnoses:  Principal Problem:   UTI (lower urinary tract infection) Active Problems:   Hypotension   Generalized weakness   Parkinson disease [facility](HCC)   Neurogenic bladder    Discharge Instructions  Discharge Instructions    DME Hospital bed    Complete by:  As directed    The above medical condition requires:  Patient requires the ability to reposition frequently   Bed type:  Semi-electric       Medication List    TAKE these medications   acetaminophen 500 MG tablet Commonly known as:  TYLENOL Take 1,000 mg by mouth every 6 (six) hours as needed for fever.   aspirin EC 81 MG tablet Take 81 mg by mouth daily.   atorvastatin 20 MG tablet Commonly known as:  LIPITOR Take 20 mg by mouth daily.   CALCIUM PO Take 1 tablet by mouth daily.   carbidopa-levodopa 25-100 MG tablet Commonly known as:  SINEMET IR Take 2 tablets by mouth 3 (three) times daily.   cefUROXime 500 MG tablet Commonly known as:  CEFTIN Take 1 tablet (500 mg total) by mouth 2 (two) times daily with a meal.   citalopram 20 MG tablet Commonly known as:  CELEXA Take 20 mg by mouth at bedtime.   docusate sodium 100 MG capsule Commonly known as:  COLACE Take 100 mg by mouth 2 (two) times daily.   ferrous sulfate 325 (65 FE) MG tablet Take 325 mg by mouth daily with breakfast.   FISH OIL PO Take 1 capsule by mouth daily.   gabapentin 100 MG capsule Commonly known as:  NEURONTIN Take 200 mg by mouth daily.   levothyroxine 88 MCG tablet Commonly known as:  SYNTHROID, LEVOTHROID Take 88 mcg  by mouth daily before breakfast.   MULTIVITAMIN GUMMIES ADULT PO Take 1 tablet by mouth daily.   rOPINIRole 0.5 MG tablet Commonly known as:  REQUIP Take 1-1.5 mg by mouth 2 (two) times daily. Take 1mg  in the evening and 1.5mg s at bedtime   saccharomyces boulardii 250 MG capsule Commonly known as:  FLORASTOR Take 1 capsule (250  mg total) by mouth 2 (two) times daily.   tamsulosin 0.4 MG Caps capsule Commonly known as:  FLOMAX Take 0.4 mg by mouth daily.      Follow-up Information    Follow up with your urologist as scheduled .        Follow up with your PCP in 1-2 weeks .        [facility] .   Why:  Home Health RN, Physical Therapy and Speech Therapy Contact information: [address] [place] [address] [phone]          No Known Allergies   Procedures/Studies: Dg Chest 2 View  Result Date: 12/20/2015 CLINICAL DATA:  Fever.  Fall 1 day prior EXAM: CHEST  2 VIEW COMPARISON:  None. FINDINGS: Lungs are clear. Heart is upper normal in size with pulmonary vascularity within normal limits. No pneumothorax. No adenopathy. There is degenerative change in the thoracic spine. IMPRESSION: No edema or consolidation. Electronically Signed   By: Bretta Bang III M.D.   On: 12/20/2015 10:11    Subjective: Very eager to go home  Discharge Exam: Vitals:   12/22/15 2221 12/23/15 0550  BP: 128/67 (!) 126/50  Pulse: 79 67  Resp: 18 20  Temp: 98.3 F (36.8 C) 98.5 F (36.9 C)   Vitals:   12/22/15 1915 12/22/15 2221 12/23/15 0550 12/23/15 0934  BP:  128/67 (!) 126/50   Pulse:  79 67   Resp:  18 20   Temp:  98.3 F (36.8 C) 98.5 F (36.9 C)   TempSrc:  Oral Oral   SpO2: 96% 99% 99% 100%  Weight:      Height:        General: Pt is alert, awake, not in acute distress Cardiovascular: RRR, S1/S2 +, no rubs, no gallops Respiratory: CTA bilaterally, no wheezing, no rhonchi Abdominal: Soft, NT, ND, bowel sounds + Extremities: no edema, no cyanosis   The results of significant diagnostics from this hospitalization (including imaging, microbiology, ancillary and laboratory) are listed below for reference.     Microbiology: Recent Results (from the past 240 hour(s))  Urine culture     Status: Abnormal   Collection Time: 12/20/15 11:41 AM  Result Value Ref Range  Status   Specimen Description URINE, CATHETERIZED  Final   Special Requests NONE  Final   Culture >=100,000 COLONIES/mL KLEBSIELLA OXYTOCA (A)  Final   Report Status 12/22/2015 FINAL  Final   Organism ID, Bacteria KLEBSIELLA OXYTOCA (A)  Final      Susceptibility   Klebsiella oxytoca - MIC*    AMPICILLIN >=32 RESISTANT Resistant     CEFAZOLIN 8 SENSITIVE Sensitive     CEFTRIAXONE <=1 SENSITIVE Sensitive     CIPROFLOXACIN <=0.25 SENSITIVE Sensitive     GENTAMICIN <=1 SENSITIVE Sensitive     IMIPENEM <=0.25 SENSITIVE Sensitive     NITROFURANTOIN 32 SENSITIVE Sensitive     TRIMETH/SULFA <=20 SENSITIVE Sensitive     AMPICILLIN/SULBACTAM 8 SENSITIVE Sensitive     PIP/TAZO <=4 SENSITIVE Sensitive     Extended ESBL NEGATIVE Sensitive     * >=100,000 COLONIES/mL KLEBSIELLA  OXYTOCA  Culture, blood (Routine X 2) w Reflex to ID Panel     Status: None (Preliminary result)   Collection Time: 12/20/15  1:18 PM  Result Value Ref Range Status   Specimen Description BLOOD LEFT HAND  Final   Special Requests BOTTLES DRAWN AEROBIC AND ANAEROBIC  Final   Culture   Final    NO GROWTH 3 DAYS Performed at [facility]    Report Status PENDING  Incomplete  Culture, blood (Routine X 2) w Reflex to ID Panel     Status: None (Preliminary result)   Collection Time: 12/20/15  1:23 PM  Result Value Ref Range Status   Specimen Description BLOOD RIGHT ANTECUBITAL  Final   Special Requests BOTTLES DRAWN AEROBIC ONLY  Final   Culture   Final    NO GROWTH 3 DAYS Performed at [facility]    Report Status PENDING  Incomplete     Labs: BNP (last 3 results) No results for input(s): BNP in the last 8760 hours. Basic Metabolic Panel:  Recent Labs Lab 12/20/15 1009 12/20/15 1550 12/21/15 0544 12/22/15 0555 12/23/15 0538  NA 136  --  138 137 137  K 4.4  --  4.1 4.2 4.3  CL 103  --  111 108 107  CO2 26  --  24 24 24   GLUCOSE 91  --  73 110* 112*  BUN 29*  --  19 13 12    CREATININE 1.11 0.96 0.86 0.92 1.24  CALCIUM 9.3  --  8.1* 8.5* 8.7*   Liver Function Tests:  Recent Labs Lab 12/20/15 1009 12/21/15 0544  AST 17 19  ALT <5* <5*  ALKPHOS 78 65  BILITOT 1.2 0.4  PROT 6.8 5.7*  ALBUMIN 3.6 2.9*   No results for input(s): LIPASE, AMYLASE in the last 168 hours. No results for input(s): AMMONIA in the last 168 hours. CBC:  Recent Labs Lab 12/20/15 1009 12/20/15 1550 12/21/15 0544 12/22/15 0555 12/23/15 0538  WBC 10.5 12.2* 8.4 5.9 4.6  NEUTROABS 7.3  --   --   --   --   HGB 10.5* 9.7* 9.1* 8.6* 8.5*  HCT 32.1* 28.9* 27.6* 25.9* 25.7*  MCV 86.5 84.3 87.3 83.3 86.2  PLT 147* 122* 131* 130* 151   Cardiac Enzymes: No results for input(s): CKTOTAL, CKMB, CKMBINDEX, TROPONINI in the last 168 hours. BNP: Invalid input(s): POCBNP CBG: No results for input(s): GLUCAP in the last 168 hours. D-Dimer No results for input(s): DDIMER in the last 72 hours. Hgb A1c No results for input(s): HGBA1C in the last 72 hours. Lipid Profile No results for input(s): CHOL, HDL, LDLCALC, TRIG, CHOLHDL, LDLDIRECT in the last 72 hours. Thyroid function studies No results for input(s): TSH, T4TOTAL, T3FREE, THYROIDAB in the last 72 hours.  Invalid input(s): FREET3 Anemia work up No results for input(s): VITAMINB12, FOLATE, FERRITIN, TIBC, IRON, RETICCTPCT in the last 72 hours. Urinalysis    Component Value Date/Time   COLORURINE YELLOW 12/20/2015 1141   APPEARANCEUR CLOUDY (A) 12/20/2015 1141   LABSPEC 1.005 12/20/2015 1141   PHURINE 6.0 12/20/2015 1141   GLUCOSEU NEGATIVE 12/20/2015 1141   HGBUR NEGATIVE 12/20/2015 1141   BILIRUBINUR NEGATIVE 12/20/2015 1141   KETONESUR NEGATIVE 12/20/2015 1141   PROTEINUR NEGATIVE 12/20/2015 1141   NITRITE POSITIVE (A) 12/20/2015 1141   LEUKOCYTESUR MODERATE (A) 12/20/2015 1141   Sepsis Labs Invalid input(s): PROCALCITONIN,  WBC,  LACTICIDVEN Microbiology Recent Results (from the past 240 hour(s))  Urine  culture     Status: Abnormal   Collection Time: 12/20/15 11:41 AM  Result Value Ref Range Status   Specimen Description URINE, CATHETERIZED  Final   Special Requests NONE  Final   Culture >=100,000 COLONIES/mL KLEBSIELLA OXYTOCA (A)  Final   Report Status 12/22/2015 FINAL  Final   Organism ID, Bacteria KLEBSIELLA OXYTOCA (A)  Final      Susceptibility   Klebsiella oxytoca - MIC*    AMPICILLIN >=32 RESISTANT Resistant     CEFAZOLIN 8 SENSITIVE Sensitive     CEFTRIAXONE <=1 SENSITIVE Sensitive     CIPROFLOXACIN <=0.25 SENSITIVE Sensitive     GENTAMICIN <=1 SENSITIVE Sensitive     IMIPENEM <=0.25 SENSITIVE Sensitive     NITROFURANTOIN 32 SENSITIVE Sensitive     TRIMETH/SULFA <=20 SENSITIVE Sensitive     AMPICILLIN/SULBACTAM 8 SENSITIVE Sensitive     PIP/TAZO <=4 SENSITIVE Sensitive     Extended ESBL NEGATIVE Sensitive     * >=100,000 COLONIES/mL KLEBSIELLA OXYTOCA  Culture, blood (Routine X 2) w Reflex to ID Panel     Status: None (Preliminary result)   Collection Time: 12/20/15  1:18 PM  Result Value Ref Range Status   Specimen Description BLOOD LEFT HAND  Final   Special Requests BOTTLES DRAWN AEROBIC AND ANAEROBIC  Final   Culture   Final    NO GROWTH 3 DAYS Performed at [facility]    Report Status PENDING  Incomplete  Culture, blood (Routine X 2) w Reflex to ID Panel     Status: None (Preliminary result)   Collection Time: 12/20/15  1:23 PM  Result Value Ref Range Status   Specimen Description BLOOD RIGHT ANTECUBITAL  Final   Special Requests BOTTLES DRAWN AEROBIC ONLY  Final   Culture   Final    NO GROWTH 3 DAYS Performed at [facility]    Report Status PENDING  Incomplete     SIGNED:   Jerald Kief, MD  [facility] 12/23/2015, 6:47 PM  If 7PM-7AM, please contact night-coverage [email or web address] Password [email or web address]

## 2015-12-25 LAB — CULTURE, BLOOD (ROUTINE X 2)
Culture: NO GROWTH
Culture: NO GROWTH

## 2016-01-20 ENCOUNTER — Inpatient Hospital Stay (HOSPITAL_COMMUNITY)
Admission: EM | Admit: 2016-01-20 | Discharge: 2016-01-22 | DRG: 699 | Disposition: A | Discharge status: Home / Self Care | Payer: Medicare Other | Attending: Nephrology | Admitting: Nephrology

## 2016-01-20 ENCOUNTER — Emergency Department (HOSPITAL_COMMUNITY): Payer: Medicare Other

## 2016-01-20 ENCOUNTER — Encounter (HOSPITAL_COMMUNITY): Payer: Self-pay | Admitting: Internal Medicine

## 2016-01-20 DIAGNOSIS — Z96653 Presence of artificial knee joint, bilateral: Secondary | ICD-10-CM | POA: Diagnosis present

## 2016-01-20 DIAGNOSIS — I959 Hypotension, unspecified: Secondary | ICD-10-CM | POA: Diagnosis present

## 2016-01-20 DIAGNOSIS — Z7982 Long term (current) use of aspirin: Secondary | ICD-10-CM

## 2016-01-20 DIAGNOSIS — E039 Hypothyroidism, unspecified: Secondary | ICD-10-CM | POA: Diagnosis present

## 2016-01-20 DIAGNOSIS — J449 Chronic obstructive pulmonary disease, unspecified: Secondary | ICD-10-CM | POA: Diagnosis present

## 2016-01-20 DIAGNOSIS — R531 Weakness: Secondary | ICD-10-CM | POA: Diagnosis present

## 2016-01-20 DIAGNOSIS — D649 Anemia, unspecified: Secondary | ICD-10-CM | POA: Diagnosis present

## 2016-01-20 DIAGNOSIS — G20 Parkinson's disease: Secondary | ICD-10-CM | POA: Diagnosis present

## 2016-01-20 DIAGNOSIS — T83518A Infection and inflammatory reaction due to other urinary catheter, initial encounter: Secondary | ICD-10-CM

## 2016-01-20 DIAGNOSIS — N319 Neuromuscular dysfunction of bladder, unspecified: Secondary | ICD-10-CM | POA: Diagnosis present

## 2016-01-20 DIAGNOSIS — Z87891 Personal history of nicotine dependence: Secondary | ICD-10-CM | POA: Diagnosis not present

## 2016-01-20 DIAGNOSIS — Z23 Encounter for immunization: Secondary | ICD-10-CM | POA: Diagnosis not present

## 2016-01-20 DIAGNOSIS — N390 Urinary tract infection, site not specified: Secondary | ICD-10-CM | POA: Diagnosis present

## 2016-01-20 DIAGNOSIS — E785 Hyperlipidemia, unspecified: Secondary | ICD-10-CM | POA: Diagnosis present

## 2016-01-20 DIAGNOSIS — A419 Sepsis, unspecified organism: Secondary | ICD-10-CM | POA: Diagnosis not present

## 2016-01-20 DIAGNOSIS — Z79899 Other long term (current) drug therapy: Secondary | ICD-10-CM | POA: Diagnosis not present

## 2016-01-20 DIAGNOSIS — T83511A Infection and inflammatory reaction due to indwelling urethral catheter, initial encounter: Principal | ICD-10-CM | POA: Diagnosis present

## 2016-01-20 DIAGNOSIS — Y846 Urinary catheterization as the cause of abnormal reaction of the patient, or of later complication, without mention of misadventure at the time of the procedure: Secondary | ICD-10-CM | POA: Diagnosis present

## 2016-01-20 DIAGNOSIS — T83511D Infection and inflammatory reaction due to indwelling urethral catheter, subsequent encounter: Secondary | ICD-10-CM | POA: Diagnosis not present

## 2016-01-20 DIAGNOSIS — E860 Dehydration: Secondary | ICD-10-CM | POA: Diagnosis present

## 2016-01-20 DIAGNOSIS — E871 Hypo-osmolality and hyponatremia: Secondary | ICD-10-CM | POA: Diagnosis present

## 2016-01-20 DIAGNOSIS — G20A1 Parkinson's disease without dyskinesia, without mention of fluctuations: Secondary | ICD-10-CM | POA: Diagnosis present

## 2016-01-20 HISTORY — DX: Hyperlipidemia, unspecified: E78.5

## 2016-01-20 HISTORY — DX: Hypothyroidism, unspecified: E03.9

## 2016-01-20 LAB — URINALYSIS, ROUTINE W REFLEX MICROSCOPIC
Bilirubin Urine: NEGATIVE
GLUCOSE, UA: NEGATIVE mg/dL
HGB URINE DIPSTICK: NEGATIVE
Ketones, ur: NEGATIVE mg/dL
Nitrite: POSITIVE — AB
Protein, ur: NEGATIVE mg/dL
SPECIFIC GRAVITY, URINE: 1.006 (ref 1.005–1.030)
pH: 6 (ref 5.0–8.0)

## 2016-01-20 LAB — CBC WITH DIFFERENTIAL/PLATELET
Basophils Absolute: 0 10*3/uL (ref 0.0–0.1)
Basophils Relative: 0 %
EOS PCT: 1 %
Eosinophils Absolute: 0.1 10*3/uL (ref 0.0–0.7)
HCT: 31.8 % — ABNORMAL LOW (ref 39.0–52.0)
HEMOGLOBIN: 10.6 g/dL — AB (ref 13.0–17.0)
LYMPHS ABS: 2.1 10*3/uL (ref 0.7–4.0)
LYMPHS PCT: 18 %
MCH: 28.6 pg (ref 26.0–34.0)
MCHC: 33.3 g/dL (ref 30.0–36.0)
MCV: 85.9 fL (ref 78.0–100.0)
Monocytes Absolute: 1 10*3/uL (ref 0.1–1.0)
Monocytes Relative: 9 %
NEUTROS PCT: 72 %
Neutro Abs: 8.5 10*3/uL — ABNORMAL HIGH (ref 1.7–7.7)
Platelets: 190 10*3/uL (ref 150–400)
RBC: 3.7 MIL/uL — AB (ref 4.22–5.81)
RDW: 14.5 % (ref 11.5–15.5)
WBC: 11.8 10*3/uL — AB (ref 4.0–10.5)

## 2016-01-20 LAB — COMPREHENSIVE METABOLIC PANEL
ALT: <5 U/L — ABNORMAL LOW (ref 17–63)
AST: 33 U/L (ref 15–41)
Albumin: 3.8 g/dL (ref 3.5–5.0)
Alkaline Phosphatase: 80 U/L (ref 38–126)
Anion gap: 6 (ref 5–15)
BUN: 23 mg/dL — AB (ref 6–20)
CHLORIDE: 101 mmol/L (ref 101–111)
CO2: 25 mmol/L (ref 22–32)
Calcium: 9.1 mg/dL (ref 8.9–10.3)
Creatinine, Ser: 1.05 mg/dL (ref 0.61–1.24)
GFR calc Af Amer: >60 mL/min (ref >60)
Glucose, Bld: 84 mg/dL (ref 65–99)
POTASSIUM: 5.1 mmol/L (ref 3.5–5.1)
SODIUM: 132 mmol/L — AB (ref 135–145)
Total Bilirubin: 1.4 mg/dL — ABNORMAL HIGH (ref 0.3–1.2)
Total Protein: 7 g/dL (ref 6.5–8.1)

## 2016-01-20 LAB — URINE MICROSCOPIC-ADD ON
RBC / HPF: NONE SEEN RBC/hpf (ref 0–5)
Squamous Epithelial / LPF: NONE SEEN

## 2016-01-20 LAB — I-STAT CG4 LACTIC ACID, ED: LACTIC ACID, VENOUS: 1.65 mmol/L (ref 0.5–1.9)

## 2016-01-20 MED ORDER — ROPINIROLE HCL 1 MG PO TABS
1.0000 mg | ORAL_TABLET | ORAL | Status: DC
  Administered 2016-01-21: 1 mg via ORAL
  Filled 2016-01-20 (×2): qty 1

## 2016-01-20 MED ORDER — SODIUM CHLORIDE 0.9% FLUSH
3.0000 mL | Freq: Two times a day (BID) | INTRAVENOUS | Status: DC
  Administered 2016-01-21: 3 mL via INTRAVENOUS

## 2016-01-20 MED ORDER — CARBIDOPA-LEVODOPA 25-100 MG PO TABS
2.0000 | ORAL_TABLET | Freq: Three times a day (TID) | ORAL | Status: DC
  Administered 2016-01-21 – 2016-01-22 (×4): 2 via ORAL
  Filled 2016-01-20 (×5): qty 2

## 2016-01-20 MED ORDER — DOCUSATE SODIUM 100 MG PO CAPS
100.0000 mg | ORAL_CAPSULE | Freq: Every day | ORAL | Status: DC
  Administered 2016-01-21 – 2016-01-22 (×2): 100 mg via ORAL
  Filled 2016-01-20 (×2): qty 1

## 2016-01-20 MED ORDER — ATORVASTATIN CALCIUM 10 MG PO TABS
20.0000 mg | ORAL_TABLET | Freq: Every day | ORAL | Status: DC
  Administered 2016-01-21 (×2): 20 mg via ORAL
  Filled 2016-01-20 (×2): qty 2

## 2016-01-20 MED ORDER — DEXTROSE 5 % IV SOLN
1.0000 g | INTRAVENOUS | Status: DC
  Administered 2016-01-21: 1 g via INTRAVENOUS
  Filled 2016-01-20 (×2): qty 10

## 2016-01-20 MED ORDER — ACETAMINOPHEN 500 MG PO TABS
1000.0000 mg | ORAL_TABLET | Freq: Four times a day (QID) | ORAL | Status: DC | PRN
  Administered 2016-01-21 (×2): 1000 mg via ORAL
  Filled 2016-01-20 (×2): qty 2

## 2016-01-20 MED ORDER — GABAPENTIN 100 MG PO CAPS
200.0000 mg | ORAL_CAPSULE | Freq: Every day | ORAL | Status: DC
Start: 2016-01-21 — End: 2016-01-22
  Administered 2016-01-21: 200 mg via ORAL
  Filled 2016-01-20: qty 2

## 2016-01-20 MED ORDER — ONDANSETRON HCL 4 MG PO TABS: 4.0000 mg | ORAL_TABLET | Freq: Four times a day (QID) | ORAL | Status: DC | PRN

## 2016-01-20 MED ORDER — ASPIRIN EC 81 MG PO TBEC
81.0000 mg | DELAYED_RELEASE_TABLET | Freq: Every day | ORAL | Status: DC
  Administered 2016-01-21 – 2016-01-22 (×2): 81 mg via ORAL
  Filled 2016-01-20 (×2): qty 1

## 2016-01-20 MED ORDER — CITALOPRAM HYDROBROMIDE 20 MG PO TABS
20.0000 mg | ORAL_TABLET | Freq: Every day | ORAL | Status: DC
  Administered 2016-01-21 (×2): 20 mg via ORAL
  Filled 2016-01-20 (×2): qty 1

## 2016-01-20 MED ORDER — ENOXAPARIN SODIUM 40 MG/0.4ML ~~LOC~~ SOLN
40.0000 mg | SUBCUTANEOUS | Status: DC
  Administered 2016-01-21 – 2016-01-22 (×2): 40 mg via SUBCUTANEOUS
  Filled 2016-01-20 (×3): qty 0.4

## 2016-01-20 MED ORDER — SODIUM CHLORIDE 0.9 % IV SOLN
INTRAVENOUS | Status: DC
  Administered 2016-01-21 (×2): via INTRAVENOUS

## 2016-01-20 MED ORDER — TAMSULOSIN HCL 0.4 MG PO CAPS
0.4000 mg | ORAL_CAPSULE | Freq: Every day | ORAL | Status: DC
  Administered 2016-01-21: 0.4 mg via ORAL
  Filled 2016-01-20 (×2): qty 1

## 2016-01-20 MED ORDER — SODIUM CHLORIDE 0.9 % IV BOLUS (SEPSIS)
1000.0000 mL | Freq: Once | INTRAVENOUS | Status: AC
  Administered 2016-01-20: 1000 mL via INTRAVENOUS

## 2016-01-20 MED ORDER — ROPINIROLE HCL 1 MG PO TABS
1.5000 mg | ORAL_TABLET | Freq: Every day | ORAL | Status: DC
  Administered 2016-01-21 (×2): 1.5 mg via ORAL
  Filled 2016-01-20 (×2): qty 1.5

## 2016-01-20 MED ORDER — LEVOTHYROXINE SODIUM 88 MCG PO TABS
88.0000 ug | ORAL_TABLET | Freq: Every day | ORAL | Status: DC
  Administered 2016-01-21 – 2016-01-22 (×2): 88 ug via ORAL
  Filled 2016-01-20 (×2): qty 1

## 2016-01-20 MED ORDER — SACCHAROMYCES BOULARDII 250 MG PO CAPS
250.0000 mg | ORAL_CAPSULE | Freq: Two times a day (BID) | ORAL | Status: DC
  Administered 2016-01-21 – 2016-01-22 (×3): 250 mg via ORAL
  Filled 2016-01-20 (×4): qty 1

## 2016-01-20 MED ORDER — DEXTROSE 5 % IV SOLN
1.0000 g | Freq: Once | INTRAVENOUS | Status: AC
  Administered 2016-01-20: 1 g via INTRAVENOUS
  Filled 2016-01-20: qty 10

## 2016-01-20 MED ORDER — ONDANSETRON HCL 4 MG/2ML IJ SOLN: 4.0000 mg | Freq: Four times a day (QID) | INTRAMUSCULAR | Status: DC | PRN

## 2016-01-20 MED ORDER — FERROUS SULFATE 325 (65 FE) MG PO TABS
325.0000 mg | ORAL_TABLET | Freq: Every day | ORAL | Status: DC
  Administered 2016-01-21 – 2016-01-22 (×2): 325 mg via ORAL
  Filled 2016-01-20 (×2): qty 1

## 2016-01-20 NOTE — ED Triage Notes (Signed)
Pt has been feeling weak and lethargic since this morning. Denies pain, N/V/D.

## 2016-01-20 NOTE — ED Notes (Signed)
[facility] help with in and out .

## 2016-01-20 NOTE — ED Provider Notes (Signed)
[facility]-EMERGENCY DEPT Provider Note   CSN: [id number] Arrival date & time: 01/20/16  1853     History   Chief Complaint Chief Complaint  Patient presents with  . Weakness  . Fever    HPI Bryan Vega is a 75 y.o. male.  The history is provided by the patient and a relative. No language interpreter was used.    Bryan Vega is a 75 y.o. male who presents to the Emergency Department complaining of fever.  Hx is provided by the patient and his son in law.  Mr. Bessinger Was in his routine state of health until earlier today when he seemed weak and fatigued. Family checked his temperature and it was 102 temporal and 100 oral. He had difficulty with ambulation. He denies any headache, chest pain, shortness of breath, cough, abdominal pain, nausea, vomiting. He does perform in and out cath due to neurogenic bladder. He denies any change in his urine quality. No known sick contacts.  Past Medical History:  Diagnosis Date  . COPD (chronic obstructive pulmonary disease) (HCC)   . Hyperlipidemia   . Hypothyroidism   . Neurogenic bladder   . Parkinson's disease [facility])     Patient Active Problem List   Diagnosis Date Noted  . Sepsis secondary to UTI (HCC) 01/20/2016  . Hyperlipidemia 01/20/2016  . Hypothyroidism 01/20/2016  . Hypotension 12/20/2015  . UTI (lower urinary tract infection) 12/20/2015  . Generalized weakness 12/20/2015  . Parkinson disease (HCC) 12/20/2015  . Neurogenic bladder 12/20/2015    Past Surgical History:  Procedure Laterality Date  . APPENDECTOMY    . JOINT REPLACEMENT    . 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 Authorizing Provider  acetaminophen (TYLENOL) 500 MG tablet Take 1,000 mg by mouth every 6 (six) hours as needed for fever.   Yes Historical Provider, MD  aspirin EC 81 MG tablet Take 81 mg by mouth daily.   Yes Historical  Provider, MD  atorvastatin (LIPITOR) 20 MG tablet Take 20 mg by mouth at bedtime.    Yes Historical Provider, MD  CALCIUM PO Take 1 tablet by mouth daily.   Yes Historical Provider, MD  carbidopa-levodopa (SINEMET IR) 25-100 MG tablet Take 2 tablets by mouth 3 (three) times daily.   Yes Historical Provider, MD  citalopram (CELEXA) 20 MG tablet Take 20 mg by mouth at bedtime.    Yes Historical Provider, MD  docusate sodium (COLACE) 100 MG capsule Take 100 mg by mouth daily.    Yes Historical Provider, MD  ferrous sulfate 325 (65 FE) MG tablet Take 325 mg by mouth daily with breakfast.   Yes Historical Provider, MD  gabapentin (NEURONTIN) 100 MG capsule Take 200 mg by mouth daily after supper.    Yes Historical Provider, MD  levothyroxine (SYNTHROID, LEVOTHROID) 88 MCG tablet Take 88 mcg by mouth daily before breakfast.   Yes Historical Provider, MD  Multiple Vitamins-Minerals (MULTIVITAMIN GUMMIES ADULT PO) Take 1 tablet by mouth daily.   Yes Historical Provider, MD  Omega-3 Fatty Acids (FISH OIL PO) Take 1 capsule by mouth daily.   Yes Historical Provider, MD  rOPINIRole (REQUIP) 0.5 MG tablet Take 1-1.5 mg by mouth 2 (two) times daily. Take 1mg  in the evening and 1.5mg s at bedtime   Yes Historical Provider, MD  tamsulosin (FLOMAX) 0.4 MG CAPS capsule Take 0.4 mg by mouth  at bedtime.    Yes Historical Provider, MD  cefUROXime (CEFTIN) 500 MG tablet Take 1 tablet (500 mg total) by mouth 2 (two) times daily with a meal. Patient not taking: Reported on 01/20/2016 12/23/15   Jerald KiefStephen K Chiu, MD  saccharomyces boulardii (FLORASTOR) 250 MG capsule Take 1 capsule (250 mg total) by mouth 2 (two) times daily. Patient not taking: Reported on 01/20/2016 12/23/15   Jerald KiefStephen K Chiu, MD    Family History Family History  Problem Relation Age of Onset  . Family history unknown: Yes    Social History Social History  Substance Use Topics  . Smoking status: Former Smoker    Packs/day: 0.50    Years: 20.00     Types: Cigarettes    Quit date: 04/20/1996  . Smokeless tobacco: Never Used  . Alcohol use Yes     Allergies   Review of patient's allergies indicates no known allergies.   Review of Systems Review of Systems  All other systems reviewed and are negative.    Physical Exam Updated Vital Signs BP 134/67   Pulse 91   Temp 98.2 F (36.8 C) (Oral)   Resp 25   SpO2 100%   Physical Exam  Constitutional: He is oriented to person, place, and time. He appears well-developed and well-nourished.  HENT:  Head: Normocephalic and atraumatic.  Cardiovascular: Normal rate and regular rhythm.   No murmur heard. Pulmonary/Chest: Effort normal and breath sounds normal. No respiratory distress.  Abdominal: Soft. There is no tenderness. There is no rebound and no guarding.  Musculoskeletal: He exhibits no tenderness.  Nonpitting edema to BLE  Neurological: He is alert and oriented to person, place, and time.  Generalized weakness  Skin: Skin is warm and dry.  Psychiatric: He has a normal mood and affect. His behavior is normal.  Nursing note and vitals reviewed.    ED Treatments / Results  Labs (all labs ordered are listed, but only abnormal results are displayed) Labs Reviewed  COMPREHENSIVE METABOLIC PANEL - Abnormal; Notable for the following:       Result Value   Sodium 132 (*)    BUN 23 (*)    ALT <5 (*)    Total Bilirubin 1.4 (*)    All other components within normal limits  CBC WITH DIFFERENTIAL/PLATELET - Abnormal; Notable for the following:    WBC 11.8 (*)    RBC 3.70 (*)    Hemoglobin 10.6 (*)    HCT 31.8 (*)    Neutro Abs 8.5 (*)    All other components within normal limits  URINALYSIS, ROUTINE W REFLEX MICROSCOPIC (NOT AT [facility]) - Abnormal; Notable for the following:    Nitrite POSITIVE (*)    Leukocytes, UA TRACE (*)    All other components within normal limits  URINE MICROSCOPIC-ADD ON - Abnormal; Notable for the following:    Bacteria, UA FEW (*)    All other  components within normal limits  CULTURE, BLOOD (ROUTINE X 2)  CULTURE, BLOOD (ROUTINE X 2)  URINE CULTURE  I-STAT CG4 LACTIC ACID, ED    EKG  EKG Interpretation  Date/Time:  Sunday January 20 2016 19:26:20 EDT Ventricular Rate:  82 PR Interval:    QRS Duration: 104 QT Interval:  412 QTC Calculation: 482 R Axis:   0 Text Interpretation:  Sinus rhythm Borderline prolonged QT interval Confirmed by Lincoln Brighamees, Liz [phone]([id number]) on 01/20/2016 7:37:44 PM       Radiology Dg Chest Port 1 View  Result Date:  01/20/2016 CLINICAL DATA:  Weakness. EXAM: PORTABLE CHEST 1 VIEW COMPARISON:  Radiographs of December 20, 2015. FINDINGS: The heart size and mediastinal contours are within normal limits. Both lungs are clear. No pneumothorax or pleural effusion is noted. The visualized skeletal structures are unremarkable. IMPRESSION: No acute cardiopulmonary abnormality seen. Electronically Signed   By: Lupita RaiderJames  Green Jr, M.D.   On: 01/20/2016 20:01    Procedures Procedures (including critical care time)  Medications Ordered in ED Medications  sodium chloride 0.9 % bolus 1,000 mL (0 mLs Intravenous Stopped 01/20/16 2153)  cefTRIAXone (ROCEPHIN) 1 g in dextrose 5 % 50 mL IVPB (0 g Intravenous Stopped 01/20/16 2253)     Initial Impression / Assessment and Plan / ED Course  I have reviewed the triage vital signs and the nursing notes.  Pertinent labs & imaging results that were available during my care of the patient were reviewed by me and considered in my medical decision making (see chart for details).  Clinical Course    Patient here for evaluation of weakness and fever. He does perform in and out cath at home. UA is concerning for developing urinary tract infection in setting of fever. Treated with Rocephin, IV fluids. Admitted to hospitalist service due to concern for potential developing sepsis.  Final Clinical Impressions(s) / ED Diagnoses   Final diagnoses:  None    New  Prescriptions Current Discharge Medication List       Tilden FossaElizabeth Bronnie Vasseur, MD 01/20/16 2342

## 2016-01-20 NOTE — H&P (Signed)
History and Physical    Bryan LevinsManilal Rickel ZOX:[id number] DOB: 12-03-1940 DOA: 01/20/2016  PCP: No primary care provider on file.   Patient coming from: Home.  Chief Complaint: Weakness and fever.  HPI: Bryan Vega is a 75 y.o. male with medical history significant of COPD, hyperlipidemia, hypothyroidism, neurogenic bladder, Parkinson's disease, recurrent sepsis secondary to UTI who is coming to the emergency department due to generalized weakness and fever since earlier today.  Per patient, he woke up this morning feeling very tired and fatigued. His temperature was taken at home and was about 102F temporal and 100F orally. He denies headache, earache, sore throat, rhinorrhea, dyspnea, productive cough, chest pain, palpitations, dizziness, diaphoresis, orthopnea or dyspnea. He has nonpitting edema of the lower extremities. He denies abdominal pain, nausea, emesis, diarrhea, constipation, melena or hematochezia. He usually does in and out cath on himself due to neurogenic bladder.  ED Course: The patient receive a 1,000 mL NS bolus and 1 g of Rocephin IVPB. WBC was 11.8, total neutrophils 8.5, hemoglobin 10.6 g/dL, sodium 811132 mmol/L, BUN was 23 mg/dL and a chest radiograph did not show any acute cardiopulmonary abnormality.  Review of Systems: As per HPI otherwise 10 point review of systems negative.    Past Medical History:  Diagnosis Date  . COPD (chronic obstructive pulmonary disease) (HCC)   . Hyperlipidemia   . Hypothyroidism   . Neurogenic bladder   . Parkinson's disease [facility](HCC)     Past Surgical History:  Procedure Laterality Date  . APPENDECTOMY    . JOINT REPLACEMENT    . 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 His smoking use included Cigarettes. He has a 10.00 pack-year smoking history. He has never used smokeless tobacco. He reports that he drinks alcohol. He reports that he does not use drugs.  No  Known Allergies  Family History  Problem Relation Age of Onset  . Family history unknown: Yes   Prior to Admission medications   Medication Sig Start Date End Date Taking? Authorizing Provider  acetaminophen (TYLENOL) 500 MG tablet Take 1,000 mg by mouth every 6 (six) hours as needed for fever.   Yes Historical Provider, MD  aspirin EC 81 MG tablet Take 81 mg by mouth daily.   Yes Historical Provider, MD  atorvastatin (LIPITOR) 20 MG tablet Take 20 mg by mouth at bedtime.    Yes Historical Provider, MD  CALCIUM PO Take 1 tablet by mouth daily.   Yes Historical Provider, MD  carbidopa-levodopa (SINEMET IR) 25-100 MG tablet Take 2 tablets by mouth 3 (three) times daily.   Yes Historical Provider, MD  citalopram (CELEXA) 20 MG tablet Take 20 mg by mouth at bedtime.    Yes Historical Provider, MD  docusate sodium (COLACE) 100 MG capsule Take 100 mg by mouth daily.    Yes Historical Provider, MD  ferrous sulfate 325 (65 FE) MG tablet Take 325 mg by mouth daily with breakfast.   Yes Historical Provider, MD  gabapentin (NEURONTIN) 100 MG capsule Take 200 mg by mouth daily after supper.    Yes Historical Provider, MD  levothyroxine (SYNTHROID, LEVOTHROID) 88 MCG tablet Take 88 mcg by mouth daily before breakfast.   Yes Historical Provider, MD  Multiple Vitamins-Minerals (MULTIVITAMIN GUMMIES ADULT PO) Take 1 tablet by mouth daily.   Yes Historical Provider, MD  Omega-3 Fatty Acids (FISH OIL PO) Take 1 capsule by mouth daily.   Yes Historical  Provider, MD  rOPINIRole (REQUIP) 0.5 MG tablet Take 1-1.5 mg by mouth 2 (two) times daily. Take 1mg  in the evening and 1.5mg s at bedtime   Yes Historical Provider, MD  tamsulosin (FLOMAX) 0.4 MG CAPS capsule Take 0.4 mg by mouth at bedtime.    Yes Historical Provider, MD  cefUROXime (CEFTIN) 500 MG tablet Take 1 tablet (500 mg total) by mouth 2 (two) times daily with a meal. Patient not taking: Reported on 01/20/2016 12/23/15   Jerald Kief, MD  saccharomyces  boulardii (FLORASTOR) 250 MG capsule Take 1 capsule (250 mg total) by mouth 2 (two) times daily. Patient not taking: Reported on 01/20/2016 12/23/15   Jerald Kief, MD    Physical Exam:  Constitutional: NAD, calm, comfortable Vitals:   01/20/16 2200 01/20/16 2230 01/20/16 2300 01/20/16 2330  BP: 144/71 150/72 132/67 134/67  Pulse: 97 98 95 91  Resp: 25 23 23 25   Temp:      TempSrc:      SpO2: 100% 100% 99% 100%   Eyes: PERRL, lids and conjunctivae normal ENMT: Mucous membranes are moist. Posterior pharynx clear of any exudate or lesions.Normal dentition.  Neck: normal, supple, no masses, no thyromegaly Respiratory: clear to auscultation bilaterally, no wheezing, no crackles. Normal respiratory effort. No accessory muscle use.  Cardiovascular: Regular rate and rhythm, no murmurs / rubs / gallops. No pitting lower extremity edema/lymphedema. 2+ pedal pulses. No carotid bruits.  Abdomen: no tenderness, no masses palpated. No hepatosplenomegaly. Bowel sounds positive.  Musculoskeletal: no clubbing / cyanosis. No joint deformity upper and lower extremities. Good ROM, no contractures. Normal muscle tone.  Skin: no rashes, lesions, ulcers. No induration Neurologic: CN 2-12 grossly intact. Sensation intact, DTR normal. Generalized weakness, but no focalities.  Psychiatric: Normal judgment and insight. Alert and oriented x 3. Normal mood.    Labs on Admission: I have personally reviewed following labs and imaging studies  CBC:  Recent Labs Lab 01/20/16 1910  WBC 11.8*  NEUTROABS 8.5*  HGB 10.6*  HCT 31.8*  MCV 85.9  PLT 190   Basic Metabolic Panel:  Recent Labs Lab 01/20/16 1910  NA 132*  K 5.1  CL 101  CO2 25  GLUCOSE 84  BUN 23*  CREATININE 1.05  CALCIUM 9.1   GFR: CrCl cannot be calculated (Unknown ideal weight.). Liver Function Tests:  Recent Labs Lab 01/20/16 1910  AST 33  ALT <5*  ALKPHOS 80  BILITOT 1.4*  PROT 7.0  ALBUMIN 3.8   No results for  input(s): LIPASE, AMYLASE in the last 168 hours. No results for input(s): AMMONIA in the last 168 hours. Coagulation Profile: No results for input(s): INR, PROTIME in the last 168 hours. Cardiac Enzymes: No results for input(s): CKTOTAL, CKMB, CKMBINDEX, TROPONINI in the last 168 hours. BNP (last 3 results) No results for input(s): PROBNP in the last 8760 hours. HbA1C: No results for input(s): HGBA1C in the last 72 hours. CBG: No results for input(s): GLUCAP in the last 168 hours. Lipid Profile: No results for input(s): CHOL, HDL, LDLCALC, TRIG, CHOLHDL, LDLDIRECT in the last 72 hours. Thyroid Function Tests: No results for input(s): TSH, T4TOTAL, FREET4, T3FREE, THYROIDAB in the last 72 hours. Anemia Panel: No results for input(s): VITAMINB12, FOLATE, FERRITIN, TIBC, IRON, RETICCTPCT in the last 72 hours. Urine analysis:    Component Value Date/Time   COLORURINE YELLOW 01/20/2016 2058   APPEARANCEUR CLEAR 01/20/2016 2058   LABSPEC 1.006 01/20/2016 2058   PHURINE 6.0 01/20/2016 2058   GLUCOSEU  NEGATIVE 01/20/2016 2058   HGBUR NEGATIVE 01/20/2016 2058   BILIRUBINUR NEGATIVE 01/20/2016 2058   KETONESUR NEGATIVE 01/20/2016 2058   PROTEINUR NEGATIVE 01/20/2016 2058   NITRITE POSITIVE (A) 01/20/2016 2058   LEUKOCYTESUR TRACE (A) 01/20/2016 2058    Radiological Exams on Admission: Dg Chest Port 1 View  Result Date: 01/20/2016 CLINICAL DATA:  Weakness. EXAM: PORTABLE CHEST 1 VIEW COMPARISON:  Radiographs of December 20, 2015. FINDINGS: The heart size and mediastinal contours are within normal limits. Both lungs are clear. No pneumothorax or pleural effusion is noted. The visualized skeletal structures are unremarkable. IMPRESSION: No acute cardiopulmonary abnormality seen. Electronically Signed   By: Lupita RaiderJames  Green Jr, M.D.   On: 01/20/2016 20:01    EKG: Independently reviewed. Vent. rate 82 BPM PR interval * ms QRS duration 104 ms QT/QTc 412/482 ms P-R-T axes 58 0 39 Sinus  rhythm Borderline prolonged QT interval  Assessment/Plan Principal Problem:   Sepsis secondary to UTI (HCC) Admit to telemetry/inpatient. Continue gentle and time-limited IV fluids. Continue Rocephin 1 g every 24 hours. Follow-up urine culture and sensitivity. The patient is scheduled to follow-up with infectious disease is on Wednesday.  Active Problems:   Hypotension Resolved after normal saline 1000 mL bolus. Monitor blood pressure.    Generalized weakness Continue sepsis treatment. Consider PT/OT evaluation if no improvement.    Parkinson disease (HCC) Continue Sinemet 3 times a day.    Neurogenic bladder Continue in and out cath.    Hyperlipidemia Continue atorvastatin 20 mg by mouth daily. Interval fasting lipid and hepatic panels monitoring.    Hypothyroidism Continue levothyroxine 88 g by mouth daily. Periodic TSH monitoring.    Anemia Continue iron supplement. Monitor hematocrit and hemoglobin.    Hyponatremia Continue gentle IV hydration. Follow-up sodium level in the morning.      DVT prophylaxis: Lovenox SQ. Code Status: Full. Family Communication: His wife and son in law were present in the room. Disposition Plan: Admit for IV antibiotic therapy for 2-3 days. Consults called:  Admission status: Inpatient/telemetry.   Bobette Moavid Manuel Deep Bonawitz MD [facility] Pager [phone].  If 7PM-7AM, please contact night-coverage [email or web address] Password [email or web address]  01/20/2016, 11:33 PM

## 2016-01-21 ENCOUNTER — Other Ambulatory Visit (HOSPITAL_COMMUNITY): Payer: Medicare Other

## 2016-01-21 DIAGNOSIS — G20 Parkinson's disease: Secondary | ICD-10-CM

## 2016-01-21 DIAGNOSIS — T83511D Infection and inflammatory reaction due to indwelling urethral catheter, subsequent encounter: Secondary | ICD-10-CM

## 2016-01-21 DIAGNOSIS — D649 Anemia, unspecified: Secondary | ICD-10-CM | POA: Diagnosis present

## 2016-01-21 DIAGNOSIS — I959 Hypotension, unspecified: Secondary | ICD-10-CM

## 2016-01-21 DIAGNOSIS — R531 Weakness: Secondary | ICD-10-CM

## 2016-01-21 DIAGNOSIS — N319 Neuromuscular dysfunction of bladder, unspecified: Secondary | ICD-10-CM

## 2016-01-21 DIAGNOSIS — T83511A Infection and inflammatory reaction due to indwelling urethral catheter, initial encounter: Secondary | ICD-10-CM

## 2016-01-21 DIAGNOSIS — E871 Hypo-osmolality and hyponatremia: Secondary | ICD-10-CM | POA: Diagnosis present

## 2016-01-21 DIAGNOSIS — N390 Urinary tract infection, site not specified: Secondary | ICD-10-CM

## 2016-01-21 LAB — APTT: APTT: 20 s — AB (ref 24–36)

## 2016-01-21 LAB — COMPREHENSIVE METABOLIC PANEL
ALK PHOS: 68 U/L (ref 38–126)
ALT: 5 U/L — AB (ref 17–63)
AST: 17 U/L (ref 15–41)
Albumin: 3 g/dL — ABNORMAL LOW (ref 3.5–5.0)
Anion gap: 6 (ref 5–15)
BILIRUBIN TOTAL: 0.8 mg/dL (ref 0.3–1.2)
BUN: 18 mg/dL (ref 6–20)
CALCIUM: 8.6 mg/dL — AB (ref 8.9–10.3)
CO2: 24 mmol/L (ref 22–32)
CREATININE: 0.93 mg/dL (ref 0.61–1.24)
Chloride: 106 mmol/L (ref 101–111)
GFR calc non Af Amer: >60 mL/min (ref >60)
Glucose, Bld: 117 mg/dL — ABNORMAL HIGH (ref 65–99)
Potassium: 4 mmol/L (ref 3.5–5.1)
SODIUM: 136 mmol/L (ref 135–145)
Total Protein: 5.6 g/dL — ABNORMAL LOW (ref 6.5–8.1)

## 2016-01-21 LAB — CBC WITH DIFFERENTIAL/PLATELET
BASOS ABS: 0 10*3/uL (ref 0.0–0.1)
BASOS PCT: 0 %
EOS ABS: 0.1 10*3/uL (ref 0.0–0.7)
Eosinophils Relative: 2 %
HEMATOCRIT: 27.3 % — AB (ref 39.0–52.0)
HEMOGLOBIN: 9.2 g/dL — AB (ref 13.0–17.0)
Lymphocytes Relative: 23 %
Lymphs Abs: 2.2 10*3/uL (ref 0.7–4.0)
MCH: 28.9 pg (ref 26.0–34.0)
MCHC: 33.7 g/dL (ref 30.0–36.0)
MCV: 85.8 fL (ref 78.0–100.0)
Monocytes Absolute: 0.8 10*3/uL (ref 0.1–1.0)
Monocytes Relative: 9 %
NEUTROS ABS: 6.3 10*3/uL (ref 1.7–7.7)
NEUTROS PCT: 66 %
Platelets: 141 10*3/uL — ABNORMAL LOW (ref 150–400)
RBC: 3.18 MIL/uL — AB (ref 4.22–5.81)
RDW: 14.4 % (ref 11.5–15.5)
WBC: 9.5 10*3/uL (ref 4.0–10.5)

## 2016-01-21 LAB — PROTIME-INR
INR: 0.98
Prothrombin Time: 13 seconds (ref 11.4–15.2)

## 2016-01-21 LAB — T4, FREE: FREE T4: 1.18 ng/dL — AB (ref 0.61–1.12)

## 2016-01-21 LAB — LACTIC ACID, PLASMA
LACTIC ACID, VENOUS: 0.5 mmol/L (ref 0.5–1.9)
Lactic Acid, Venous: 0.7 mmol/L (ref 0.5–1.9)

## 2016-01-21 LAB — TSH: TSH: 0.656 u[IU]/mL (ref 0.350–4.500)

## 2016-01-21 LAB — PROCALCITONIN: Procalcitonin: <0.1 ng/mL

## 2016-01-21 MED ORDER — INFLUENZA VAC SPLIT QUAD 0.5 ML IM SUSY
0.5000 mL | PREFILLED_SYRINGE | INTRAMUSCULAR | Status: AC
  Administered 2016-01-22: 0.5 mL via INTRAMUSCULAR
  Filled 2016-01-21: qty 0.5

## 2016-01-21 NOTE — Progress Notes (Signed)
PROGRESS NOTE    Bryan Vega  BJY:[id number] DOB: 10/19/1940 DOA: 01/20/2016 PCP: No primary care provider on file.   Brief Narrative: 75 y.o. male with medical history significant of COPD, hyperlipidemia, hypothyroidism, neurogenic bladder, Parkinson's disease, recurrent sepsis secondary to UTI who is coming to the emergency department due to generalized weakness and fever.  Assessment & Plan:   #Fever and generalized weakness: Likely in the setting of possible UTI . Chest x-ray unremarkable. -Patient is afebrile here. Continue antibiotics and wait for the cultures. -Patient has history of Parkinson's disease, PT evaluation. -TSH level acceptable. -Pro calcitonin level not elevated, no leukocytosis. I do not think patient is septic.  # Hypotension: Likely due to dehydration. Improved blood pressure.  #History of neurogenic bladder requiring intermittent self-catheterization at home, with possible related UTI, acute cystitis without hematuria: Continue ceftriaxone. Follow up urine and blood culture result.  # Parkinson disease [facility](HCC): Continue Sinemet and home medications.    #Neurogenic bladder: Continue self-catheterization in the hospital. Discussed with the nurse.   #  Hypothyroidism: TSH level acceptable. Continue Synthroid.  # Normocytic anemia: Hemoglobin around baseline.  Continue current medical and supportive care. Continue ceftriaxone. Follow-up culture results. PT, OT evaluation.  DVT prophylaxis: Lovenox  Code Status: full code  Family Communication: the patient's wife at bedside  Disposition Plan:Likely discharge home in 1-2 days  Consultants:   None   Procedures: none  Antimicrobials:Ceftriaxone since October 30   Subjective:Patient was seen and examined at bedside. Patient reported feeling better. He denied headache, dizziness, nausea, vomiting, chest pain, shortness of breath, cough, diarrhea, abdominal pain. Does self-catheterization at  home.  Objective: Vitals:   01/20/16 2300 01/20/16 2330 01/20/16 2355 01/21/16 0710  BP: 132/67 134/67 137/71 (!) 102/56  Pulse: 95 91 94 69  Resp: 23 25 20 20   Temp:   99.5 F (37.5 C) 98.7 F (37.1 C)  TempSrc:   Oral Oral  SpO2: 99% 100% 100% 98%  Weight:   75.3 kg (166 lb 0.1 oz)   Height:   5\' 8"  (1.727 m)     Intake/Output Summary (Last 24 hours) at 01/21/16 1300 Last data filed at 01/21/16 0600  Gross per 24 hour  Intake            292.5 ml  Output              900 ml  Net           -607.5 ml   Filed Weights   01/20/16 2355  Weight: 75.3 kg (166 lb 0.1 oz)    Examination:  General exam: Appears calm and comfortable  Respiratory system: Clear to auscultation. Respiratory effort normal. No wheezing or crackle Cardiovascular system: S1 & S2 heard, RRR.  No pedal edema. Gastrointestinal system: Abdomen is nondistended, soft and nontender. Normal bowel sounds heard. Central nervous system: Alert and oriented. No focal neurological deficits. Skin: No rashes, lesions or ulcers Psychiatry: Judgement and insight appear normal. Mood & affect appropriate.     Data Reviewed: I have personally reviewed following labs and imaging studies  CBC:  Recent Labs Lab 01/20/16 1910 01/21/16 0401  WBC 11.8* 9.5  NEUTROABS 8.5* 6.3  HGB 10.6* 9.2*  HCT 31.8* 27.3*  MCV 85.9 85.8  PLT 190 141*   Basic Metabolic Panel:  Recent Labs Lab 01/20/16 1910 01/21/16 0324  NA 132* 136  K 5.1 4.0  CL 101 106  CO2 25 24  GLUCOSE 84 117*  BUN 23* 18  CREATININE  1.05 0.93  CALCIUM 9.1 8.6*   GFR: Estimated Creatinine Clearance: 65.4 mL/min (by C-G formula based on SCr of 0.93 mg/dL). Liver Function Tests:  Recent Labs Lab 01/20/16 1910 01/21/16 0324  AST 33 17  ALT <5* 5*  ALKPHOS 80 68  BILITOT 1.4* 0.8  PROT 7.0 5.6*  ALBUMIN 3.8 3.0*   No results for input(s): LIPASE, AMYLASE in the last 168 hours. No results for input(s): AMMONIA in the last 168  hours. Coagulation Profile:  Recent Labs Lab 01/21/16 0324  INR 0.98   Cardiac Enzymes: No results for input(s): CKTOTAL, CKMB, CKMBINDEX, TROPONINI in the last 168 hours. BNP (last 3 results) No results for input(s): PROBNP in the last 8760 hours. HbA1C: No results for input(s): HGBA1C in the last 72 hours. CBG: No results for input(s): GLUCAP in the last 168 hours. Lipid Profile: No results for input(s): CHOL, HDL, LDLCALC, TRIG, CHOLHDL, LDLDIRECT in the last 72 hours. Thyroid Function Tests:  Recent Labs  01/21/16 0555  TSH 0.656  FREET4 1.18*   Anemia Panel: No results for input(s): VITAMINB12, FOLATE, FERRITIN, TIBC, IRON, RETICCTPCT in the last 72 hours. Sepsis Labs:  Recent Labs Lab 01/20/16 1932 01/21/16 0324 01/21/16 0555  PROCALCITON  --  <0.10  --   LATICACIDVEN 1.65 0.7 0.5    No results found for this or any previous visit (from the past 240 hour(s)).       Radiology Studies: Dg Chest Port 1 View  Result Date: 01/20/2016 CLINICAL DATA:  Weakness. EXAM: PORTABLE CHEST 1 VIEW COMPARISON:  Radiographs of December 20, 2015. FINDINGS: The heart size and mediastinal contours are within normal limits. Both lungs are clear. No pneumothorax or pleural effusion is noted. The visualized skeletal structures are unremarkable. IMPRESSION: No acute cardiopulmonary abnormality seen. Electronically Signed   By: Lupita RaiderJames  Green Jr, M.D.   On: 01/20/2016 20:01        Scheduled Meds: . aspirin EC  81 mg Oral Daily  . atorvastatin  20 mg Oral QHS  . carbidopa-levodopa  2 tablet Oral TID  . cefTRIAXone (ROCEPHIN)  IV  1 g Intravenous Q24H  . citalopram  20 mg Oral QHS  . docusate sodium  100 mg Oral Daily  . enoxaparin (LOVENOX) injection  40 mg Subcutaneous Q24H  . ferrous sulfate  325 mg Oral Q breakfast  . gabapentin  200 mg Oral QPC supper  . [START ON 01/22/2016] Influenza vac split quadrivalent PF  0.5 mL Intramuscular Tomorrow-1000  . levothyroxine  88  mcg Oral QAC breakfast  . rOPINIRole  1 mg Oral Q24H  . rOPINIRole  1.5 mg Oral QHS  . saccharomyces boulardii  250 mg Oral BID  . sodium chloride flush  3 mL Intravenous Q12H  . tamsulosin  0.4 mg Oral QHS   Continuous Infusions: . sodium chloride 75 mL/hr at 01/21/16 0206     LOS: 1 day    Time spent: 29 minutes.    Dron Jaynie CollinsPrasad Bhandari, MD [facility] Pager [phone]  If 7PM-7AM, please contact night-coverage [email or web address] Password [email or web address] 01/21/2016, 1:00 PM

## 2016-01-22 LAB — CBC WITH DIFFERENTIAL/PLATELET
Basophils Absolute: 0 10*3/uL (ref 0.0–0.1)
Basophils Relative: 0 %
Eosinophils Absolute: 0.4 10*3/uL (ref 0.0–0.7)
Eosinophils Relative: 4 %
HEMATOCRIT: 27.8 % — AB (ref 39.0–52.0)
HEMOGLOBIN: 9.3 g/dL — AB (ref 13.0–17.0)
LYMPHS ABS: 2.3 10*3/uL (ref 0.7–4.0)
LYMPHS PCT: 25 %
MCH: 29 pg (ref 26.0–34.0)
MCHC: 33.5 g/dL (ref 30.0–36.0)
MCV: 86.6 fL (ref 78.0–100.0)
MONO ABS: 0.9 10*3/uL (ref 0.1–1.0)
MONOS PCT: 10 %
NEUTROS ABS: 5.5 10*3/uL (ref 1.7–7.7)
NEUTROS PCT: 61 %
Platelets: 150 10*3/uL (ref 150–400)
RBC: 3.21 MIL/uL — ABNORMAL LOW (ref 4.22–5.81)
RDW: 14.7 % (ref 11.5–15.5)
WBC: 9 10*3/uL (ref 4.0–10.5)

## 2016-01-22 LAB — T3, FREE: T3, Free: 1.6 pg/mL — ABNORMAL LOW (ref 2.0–4.4)

## 2016-01-22 MED ORDER — CIPROFLOXACIN HCL 500 MG PO TABS
500.0000 mg | ORAL_TABLET | Freq: Two times a day (BID) | ORAL | 0 refills | Status: AC
Start: 2016-01-22 — End: 2016-01-27

## 2016-01-22 NOTE — Discharge Summary (Signed)
Physician Discharge Summary  Bryan Vega WUJ:[id number] DOB: Nov 25, 1940 DOA: 01/20/2016  PCP: No primary care provider on file.  Admit date: 01/20/2016 Discharge date: 01/22/2016  Admitted From:Home Disposition:  Home  Recommendations for Outpatient Follow-up:  1. Follow up with PCP in 1-2 weeks 2. Please obtain BMP/CBC in one week 3. 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  Please see below for detail hospital course:  #Fever and generalized weakness: Likely in the setting of possible UTI . Chest x-ray unremarkable. -Patient remained afebrile in the hospital. TSH level acceptable. Pro calcitonin not elevated. Patient has history of Parkinson disease. -Urine culture preliminary growing pseudomonas. Prior urine culture result reviewed. Plan to discharge with oral ciprofloxacin for 5 days. Patient reported that he has appointment with his infectious disease doctor tomorrow. He will discuss about the final culture result there.  # Hypotension: Likely due to dehydration. Improved blood pressure.  #History of neurogenic bladder requiring intermittent self-catheterization at home, with possible related UTI, acute cystitis without hematuria: Discharged with oral ciprofloxacin. Advised outpatient follow-up with PCP and ID.  # Parkinson disease [facility](HCC): Continue Sinemet and home medications.    #Neurogenic bladder: Continue self-catheterization.   #  Hypothyroidism: TSH level acceptable. Continue Synthroid.  # Normocytic anemia: Hemoglobin around  baseline.  Patient reported feeling better. Patient's wife is eager to take him home today. She does intermittent catheterization of the patient. She and the patient requested me to talk to her daughter who is a Publishing rights managernurse practitioner at [employer]. I spoke with her in length and updated to the plan of care. The patient and family reported that he has appointment with his infectious disease doctor from prior hospitalization tomorrow. They will discuss the culture results and further antibiotics there. Patient reported feeling better and he is afebrile in the hospital. Blood cultures not growing so far. Patient is medically stable to discharge home with outpatient follow-up.  Discharge Diagnoses:  Principal Problem:   Fever likely due to UTI  Active Problems:   Hypotension   Generalized weakness   Parkinson disease (HCC)   Neurogenic bladder   Hyperlipidemia   Hypothyroidism   Hyponatremia   Anemia   Infection due to urethral catheter [facility](HCC)    Discharge Instructions  Discharge Instructions    Call MD for:  difficulty breathing, headache or visual disturbances    Complete by:  As directed    Call MD for:  hives    Complete by:  As directed    Call MD for:  persistant dizziness or light-headedness    Complete by:  As directed    Call MD for:  temperature >100.4    Complete by:  As directed    Diet - low sodium heart healthy    Complete by:  As directed    Discharge instructions    Complete by:  As directed    Please follow up with PCP in one week.  Please continue to follow up with your Infectious disease physician. Please take yogurt or probiotics while on antibiotics.   Increase activity slowly    Complete by:  As directed        Medication List    STOP  taking these medications   cefUROXime 500 MG tablet Commonly known as:  CEFTIN     TAKE these medications   acetaminophen 500 MG tablet Commonly known as:  TYLENOL Take 1,000 mg by mouth every 6 (six) hours as needed for  fever.   aspirin EC 81 MG tablet Take 81 mg by mouth daily.   atorvastatin 20 MG tablet Commonly known as:  LIPITOR Take 20 mg by mouth at bedtime.   CALCIUM PO Take 1 tablet by mouth daily.   carbidopa-levodopa 25-100 MG tablet Commonly known as:  SINEMET IR Take 2 tablets by mouth 3 (three) times daily.   ciprofloxacin 500 MG tablet Commonly known as:  CIPRO Take 1 tablet (500 mg total) by mouth 2 (two) times daily.   citalopram 20 MG tablet Commonly known as:  CELEXA Take 20 mg by mouth at bedtime.   docusate sodium 100 MG capsule Commonly known as:  COLACE Take 100 mg by mouth daily.   ferrous sulfate 325 (65 FE) MG tablet Take 325 mg by mouth daily with breakfast.   FISH OIL PO Take 1 capsule by mouth daily.   gabapentin 100 MG capsule Commonly known as:  NEURONTIN Take 200 mg by mouth daily after supper.   levothyroxine 88 MCG tablet Commonly known as:  SYNTHROID, LEVOTHROID Take 88 mcg by mouth daily before breakfast.   MULTIVITAMIN GUMMIES ADULT PO Take 1 tablet by mouth daily.   rOPINIRole 0.5 MG tablet Commonly known as:  REQUIP Take 1-1.5 mg by mouth 2 (two) times daily. Take 1mg  in the evening and 1.5mg s at bedtime   saccharomyces boulardii 250 MG capsule Commonly known as:  FLORASTOR Take 1 capsule (250 mg total) by mouth 2 (two) times daily.   tamsulosin 0.4 MG Caps capsule Commonly known as:  FLOMAX Take 0.4 mg by mouth at bedtime.       No Known Allergies  Consultations:None   Procedures/Studies: None   Subjective: Patient was seen and examined at bedside. Wife at bedside. Patient reported feeling better. He is afebrile. Denied headache, dizziness, nausea, vomiting, chest pain, shortness of breath, abdominal pain. He has no diarrhea. He reported feeling better and eager to go home today. I spoke with the patient's daughter over the phone as per patient and patient's wife's request.   Discharge Exam: Vitals:   01/21/16 2037  01/22/16 0421  BP: 133/60 (!) 122/54  Pulse: 87   Resp: 20 20  Temp: 98.1 F (36.7 C) 99.4 F (37.4 C)   Vitals:   01/21/16 0710 01/21/16 1535 01/21/16 2037 01/22/16 0421  BP: (!) 102/56 131/61 133/60 (!) 122/54  Pulse: 69 70 87   Resp: 20 20 20 20   Temp: 98.7 F (37.1 C) 98.7 F (37.1 C) 98.1 F (36.7 C) 99.4 F (37.4 C)  TempSrc: Oral Oral Oral Oral  SpO2: 98% 99% 100% 99%  Weight:      Height:        General: Pt is alert, awake, not in acute distress Cardiovascular: RRR, S1/S2 +, no rubs, no gallops Respiratory: CTA bilaterally, no wheezing, no rhonchi Abdominal: Soft, NT, ND, bowel sounds + Extremities: no edema, no cyanosis    The results of significant diagnostics from this hospitalization (including imaging, microbiology, ancillary and laboratory) are listed below for reference.     Microbiology: Recent Results (from the past 240 hour(s))  Urine culture     Status: Abnormal (Preliminary result)   Collection Time: 01/20/16  8:58 PM  Result Value  Ref Range Status   Specimen Description URINE, CLEAN CATCH  Final   Special Requests NONE  Final   Culture >=100,000 COLONIES/mL PSEUDOMONAS AERUGINOSA (A)  Final   Report Status PENDING  Incomplete     Labs: BNP (last 3 results) No results for input(s): BNP in the last 8760 hours. Basic Metabolic Panel:  Recent Labs Lab 01/20/16 1910 01/21/16 0324  NA 132* 136  K 5.1 4.0  CL 101 106  CO2 25 24  GLUCOSE 84 117*  BUN 23* 18  CREATININE 1.05 0.93  CALCIUM 9.1 8.6*   Liver Function Tests:  Recent Labs Lab 01/20/16 1910 01/21/16 0324  AST 33 17  ALT <5* 5*  ALKPHOS 80 68  BILITOT 1.4* 0.8  PROT 7.0 5.6*  ALBUMIN 3.8 3.0*   No results for input(s): LIPASE, AMYLASE in the last 168 hours. No results for input(s): AMMONIA in the last 168 hours. CBC:  Recent Labs Lab 01/20/16 1910 01/21/16 0401 01/22/16 0333  WBC 11.8* 9.5 9.0  NEUTROABS 8.5* 6.3 5.5  HGB 10.6* 9.2* 9.3*  HCT 31.8* 27.3*  27.8*  MCV 85.9 85.8 86.6  PLT 190 141* 150   Cardiac Enzymes: No results for input(s): CKTOTAL, CKMB, CKMBINDEX, TROPONINI in the last 168 hours. BNP: Invalid input(s): POCBNP CBG: No results for input(s): GLUCAP in the last 168 hours. D-Dimer No results for input(s): DDIMER in the last 72 hours. Hgb A1c No results for input(s): HGBA1C in the last 72 hours. Lipid Profile No results for input(s): CHOL, HDL, LDLCALC, TRIG, CHOLHDL, LDLDIRECT in the last 72 hours. Thyroid function studies  Recent Labs  01/21/16 0555  TSH 0.656  T3FREE 1.6*   Anemia work up No results for input(s): VITAMINB12, FOLATE, FERRITIN, TIBC, IRON, RETICCTPCT in the last 72 hours. Urinalysis    Component Value Date/Time   COLORURINE YELLOW 01/20/2016 2058   APPEARANCEUR CLEAR 01/20/2016 2058   LABSPEC 1.006 01/20/2016 2058   PHURINE 6.0 01/20/2016 2058   GLUCOSEU NEGATIVE 01/20/2016 2058   HGBUR NEGATIVE 01/20/2016 2058   BILIRUBINUR NEGATIVE 01/20/2016 2058   KETONESUR NEGATIVE 01/20/2016 2058   PROTEINUR NEGATIVE 01/20/2016 2058   NITRITE POSITIVE (A) 01/20/2016 2058   LEUKOCYTESUR TRACE (A) 01/20/2016 2058   Sepsis Labs Invalid input(s): PROCALCITONIN,  WBC,  LACTICIDVEN Microbiology Recent Results (from the past 240 hour(s))  Urine culture     Status: Abnormal (Preliminary result)   Collection Time: 01/20/16  8:58 PM  Result Value Ref Range Status   Specimen Description URINE, CLEAN CATCH  Final   Special Requests NONE  Final   Culture >=100,000 COLONIES/mL PSEUDOMONAS AERUGINOSA (A)  Final   Report Status PENDING  Incomplete     Time coordinating discharge: Over 30 minutes  SIGNED:   Maxie Barb, MD  [facility] 01/22/2016, 11:37 AM  If 7PM-7AM, please contact night-coverage [email or web address] Password [email or web address]

## 2016-01-22 NOTE — Progress Notes (Signed)
Pt and family declined HH at present time.

## 2016-01-23 ENCOUNTER — Encounter: Payer: Self-pay | Admitting: Infectious Disease

## 2016-01-23 ENCOUNTER — Ambulatory Visit (INDEPENDENT_AMBULATORY_CARE_PROVIDER_SITE_OTHER): Payer: Medicare Other | Admitting: Infectious Disease

## 2016-01-23 VITALS — BP 146/71 | HR 61 | Temp 97.8°F | Wt 177.0 lb

## 2016-01-23 DIAGNOSIS — N390 Urinary tract infection, site not specified: Secondary | ICD-10-CM | POA: Diagnosis not present

## 2016-01-23 DIAGNOSIS — T83511D Infection and inflammatory reaction due to indwelling urethral catheter, subsequent encounter: Secondary | ICD-10-CM | POA: Diagnosis not present

## 2016-01-23 DIAGNOSIS — A419 Sepsis, unspecified organism: Secondary | ICD-10-CM

## 2016-01-23 DIAGNOSIS — N319 Neuromuscular dysfunction of bladder, unspecified: Secondary | ICD-10-CM | POA: Diagnosis not present

## 2016-01-23 DIAGNOSIS — G20 Parkinson's disease: Secondary | ICD-10-CM

## 2016-01-23 HISTORY — DX: Urinary tract infection, site not specified: N39.0

## 2016-01-23 LAB — URINE CULTURE

## 2016-01-23 MED ORDER — FOSFOMYCIN TROMETHAMINE 3 G PO PACK: 3.0000 g | PACK | ORAL | 3 refills | Status: DC

## 2016-01-23 NOTE — Progress Notes (Signed)
Reason for consult: Recurrent urinary tract infections  Requesting physician: Dr. Jennelle Humanottle and Dr. Rhona Leavenshiu   Subjective:    Patient ID: Bryan Vega, male    DOB: Apr 27, 1940, 75 y.o.   MRN: [id number]  HPI  Bryan Vega is a [age] year old [place] man with multiple medical problems including parkinsonism with a neurogenic bladder requiring self-catheterization 2-3 times a day. 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   Because he does not urinate on his own his symptoms almost never include dysuria. Rather his urinary tract infections are typically more manifest by him having fevers and becoming more confused and lethargic.  He was recently admitted to the hospital in late October after a prior admission in September. In October 20 19th developed worsening weakness as well as a fever that was measured 102. He was admitted to hospitalist service blood and urine cultures were drawn and he was treated with IV ceftriaxone for 3 days. He had improvement in his symptoms but actually grew Pseudomonas aeruginosa from his urine which is obviously not sensitive to his ceftriaxone. He was discharged as home with ciprofloxacin orally despite the sensitivities not being back yet.  We had previously been consulted to see the patient as an outpatient to work him up for recurrent urinary tract infections and to advise on the management of these infections.        Past Medical History:  Diagnosis Date  . COPD (chronic obstructive pulmonary disease) (HCC)   . Hyperlipidemia   . Hypothyroidism   . Neurogenic bladder   . Parkinson's disease [facility](HCC)     Past Surgical History:  Procedure Laterality Date  . APPENDECTOMY    . JOINT REPLACEMENT    . 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 Family history unknown: Yes      Social History   Social History  . Marital status: Married    Spouse name: N/A  . Number of children: N/A  . Years of education: N/A   Social History Main Topics  . Smoking status: Former Smoker    Packs/day: 0.50    Years: 20.00    Types: Cigarettes    Quit date: 04/20/1996  . Smokeless tobacco: Never Used  . Alcohol use Yes  . Drug use: No  . Sexual activity: No   Other Topics Concern  . None   Social History Narrative  . None    No Known Allergies   Current Outpatient Prescriptions:  .  acetaminophen (TYLENOL) 500 MG tablet, Take 1,000 mg by mouth every 6 (six) hours as needed for fever., Disp: , Rfl:  .  aspirin EC 81 MG tablet, Take 81 mg by mouth daily., Disp: , Rfl:  .  atorvastatin (LIPITOR) 20 MG tablet, Take 20 mg by mouth at bedtime. , Disp: , Rfl:  .  CALCIUM PO, Take 1 tablet by mouth daily., Disp: , Rfl:  .  carbidopa-levodopa (SINEMET IR) 25-100 MG tablet, Take 2 tablets by mouth 3 (three) times daily., Disp: , Rfl:  .  ciprofloxacin (CIPRO) 500 MG tablet, Take 1 tablet (500 mg total) by mouth 2 (two) times daily., Disp: 10 tablet, Rfl: 0 .  citalopram (CELEXA) 20 MG tablet, Take 20 mg by mouth at bedtime. , Disp: , Rfl:  .  docusate sodium (COLACE) 100 MG capsule, Take 100 mg by mouth daily. , Disp: , Rfl:  .  ferrous sulfate 325 (65 FE) MG tablet, Take 325 mg by mouth daily with breakfast., Disp: , Rfl:  .  gabapentin (NEURONTIN) 100 MG capsule, Take 200 mg by mouth daily after supper. , Disp: , Rfl:  .  levothyroxine (SYNTHROID, LEVOTHROID) 88 MCG tablet, Take 88 mcg by mouth daily before breakfast., Disp: , Rfl:  .  Multiple Vitamins-Minerals (MULTIVITAMIN GUMMIES ADULT PO), Take 1 tablet by mouth daily., Disp: , Rfl:  .  Omega-3 Fatty Acids (FISH OIL PO), Take 1 capsule by mouth daily., Disp: , Rfl:  .  rOPINIRole (REQUIP) 0.5 MG tablet, Take 1-1.5 mg by  mouth 2 (two) times daily. Take 1mg  in the evening and 1.5mg s at bedtime, Disp: , Rfl:  .  tamsulosin (FLOMAX) 0.4 MG CAPS capsule, Take 0.4 mg by mouth at bedtime. , Disp: , Rfl:  .  fosfomycin (MONUROL) 3 g PACK, Take 3 g by mouth as directed., Disp: 6 g, Rfl: 3    Review of Systems  Unable to perform ROS: Dementia       Objective:   Physical Exam  Constitutional: He appears well-nourished. No distress.  HENT:  Head: Normocephalic.  Mouth/Throat: Oropharynx is clear and moist. No oropharyngeal exudate.  Eyes: Conjunctivae are normal.  Neck: Normal range of motion.  Cardiovascular: Normal rate.   Pulmonary/Chest: Effort normal. No respiratory distress.  Abdominal: Soft. Bowel sounds are normal. He exhibits no distension.  Neurological: He is alert.  Fairly significant rigidity of his muscles sitting in wheelchair  Skin: He is not diaphoretic.  Psychiatric: His speech is delayed. He is slowed. Cognition and memory are impaired.          Assessment & Plan:   Recurrent UTI: Reviewed the episodes that the son-in-law brought with him in the detailed summaries from 8G male account of the different episodes of urinary tract infections and perceived urinary tract infections. Is clear the patient has had clear-cut bladder infections and in fact, played ones and when one case complicated by a gram-negative bacteremia. However there are several other episodes which are not clear-cut episodes of urinary tract infections. For example when [facility] he improved with ceftriaxone which he got for 3 days when the actual organism that grew them a urine was a pseudomonal species which is not sensitive to ceftriaxone. This far you that he never really had a urinary tract infection but perhaps would was doing worse clinically for other reasons and improved with hydration and symptomatic management. Alternatively he could've been clearing the infection on his own but in  either case one could argue that he does not need the ciprofloxacin he is currently taking.  There is a high amount of anxiety however about risk for hospitalization an acute deterioration in this patient.  For now I've advised the patient his wife and son-in-law that we need to proceed carefully and prudently with use of antibiotics I do think it is not unreasonable for him to have some fosfomycin at home that the patient could initiate with onset of symptoms of fever or increased lethargy to suggest infection and that he could start if he could not get access to a primary care or urgent care facility in a timely manner. That weight chart exam box ahead of time which typically be active against a broad array of pathogens note they will not at  higher up than the bladder  Concern about use of amoxicillin prior to dental procedures: He is doing this in accordance with orthopedic surgery guidelines with regards to preventing infection of prosthetic knee. I made note that this recommendation for prophylactic antibiotics is not an infectious disease 1 but one that comes him to orthopedic surgery societies. I think it will be fine for him to take amoxicillin if needed in and if they feel better and wanted to act in accord with the orthopedic surgery guidelines. The main danger to the patient is just that he may develop resistance while being given amoxicillin. I don't think is one need frequent prescriptions of amoxicillin for dental procedures so I'm not terribly worried about breeding resistance.  Neurogenic bladder: The son-in-law and wife and patient also asked about whether a suprapubic catheter we better than having himself catheterize multiple times a day. I do think it might improve urine flow in that the urine stasis may be behind some of his risk for developing bladder infections from the organisms that colonize his urine.    I spent greater than 60 minutes with the patient including greater than 50%  of time in face to face counsel of the patient re his current bladder infections his prosthetic knees his dental surgeries and his probiotic use and in coordination of his care.

## 2016-01-26 LAB — CULTURE, BLOOD (ROUTINE X 2)
CULTURE: NO GROWTH
CULTURE: NO GROWTH
Culture: NO GROWTH
Culture: NO GROWTH

## 2016-02-25 ENCOUNTER — Telehealth: Payer: Self-pay

## 2016-02-25 NOTE — Telephone Encounter (Signed)
Voicemail: Questions regarding recommendations or instructions given to her Father.   No answer .

## 2016-04-23 ENCOUNTER — Ambulatory Visit: Payer: Medicare Other | Admitting: Infectious Disease

## 2016-04-28 ENCOUNTER — Ambulatory Visit: Payer: Medicare Other | Admitting: Infectious Disease

## 2016-06-04 ENCOUNTER — Ambulatory Visit: Payer: Medicare Other | Admitting: Infectious Disease

## 2016-06-19 ENCOUNTER — Telehealth: Payer: Self-pay | Admitting: *Deleted

## 2016-06-19 NOTE — Telephone Encounter (Signed)
Patient's daughter called stating [facility] was [facility] and a urine test was run that showed large leukocytes, few bacteria, and rare mucous. He was given an Rx for macrobid, but has not taken it and daughter requested they run a urine culture. She said Dr. Daiva EvesVan Dam can see his results in care anywhere. She would like Dr. Daiva EvesVan Dam to review the results and advise on treatment. Follow up appointment is 07/16/16.

## 2016-06-20 NOTE — Telephone Encounter (Signed)
I do not see ANY culture having been done and I do not see that symptoms of UTI were documented so I do not see why UA was done nor macrobid was prescribed. I would not have checked UA or rx macrobid. I do not recommend antibiotics based on appearance of a UA that is NOT sufficient and one of the reasons this pt has received TOO many abx with emerging resistant flora.

## 2016-06-23 ENCOUNTER — Telehealth: Payer: Self-pay | Admitting: *Deleted

## 2016-06-23 NOTE — Telephone Encounter (Signed)
Patient's daughter called for Urine culture results. Per Care Everywhere, this is still pending.  RN returned call, left message asking patient's daughter to have the results faxed to Dr Zenaida Niece Dam's attention. Bryan Vega

## 2016-06-23 NOTE — Telephone Encounter (Signed)
Patient's daughter notified and she also agreed with this plan. Wendall Mola CMA

## 2016-06-23 NOTE — Telephone Encounter (Signed)
Thanks so much Jackie! 

## 2016-06-24 ENCOUNTER — Inpatient Hospital Stay (HOSPITAL_COMMUNITY)
Admission: EM | Admit: 2016-06-24 | Discharge: 2016-06-26 | DRG: 872 | Disposition: A | Discharge status: Home Health | Payer: Medicare Other | Attending: Family Medicine | Admitting: Family Medicine

## 2016-06-24 ENCOUNTER — Telehealth: Payer: Self-pay | Admitting: *Deleted

## 2016-06-24 ENCOUNTER — Encounter (HOSPITAL_COMMUNITY): Payer: Self-pay | Admitting: Emergency Medicine

## 2016-06-24 ENCOUNTER — Emergency Department (HOSPITAL_COMMUNITY): Payer: Medicare Other

## 2016-06-24 ENCOUNTER — Encounter: Payer: Self-pay | Admitting: Neurology

## 2016-06-24 DIAGNOSIS — M7989 Other specified soft tissue disorders: Secondary | ICD-10-CM | POA: Diagnosis present

## 2016-06-24 DIAGNOSIS — N319 Neuromuscular dysfunction of bladder, unspecified: Secondary | ICD-10-CM | POA: Diagnosis present

## 2016-06-24 DIAGNOSIS — G20 Parkinson's disease: Secondary | ICD-10-CM | POA: Diagnosis present

## 2016-06-24 DIAGNOSIS — M1990 Unspecified osteoarthritis, unspecified site: Secondary | ICD-10-CM | POA: Diagnosis present

## 2016-06-24 DIAGNOSIS — N10 Acute tubulo-interstitial nephritis: Secondary | ICD-10-CM | POA: Diagnosis present

## 2016-06-24 DIAGNOSIS — Z87440 Personal history of urinary (tract) infections: Secondary | ICD-10-CM | POA: Diagnosis not present

## 2016-06-24 DIAGNOSIS — E039 Hypothyroidism, unspecified: Secondary | ICD-10-CM | POA: Diagnosis present

## 2016-06-24 DIAGNOSIS — Z87891 Personal history of nicotine dependence: Secondary | ICD-10-CM | POA: Diagnosis not present

## 2016-06-24 DIAGNOSIS — I959 Hypotension, unspecified: Secondary | ICD-10-CM | POA: Diagnosis present

## 2016-06-24 DIAGNOSIS — N3000 Acute cystitis without hematuria: Secondary | ICD-10-CM | POA: Diagnosis present

## 2016-06-24 DIAGNOSIS — E785 Hyperlipidemia, unspecified: Secondary | ICD-10-CM | POA: Diagnosis present

## 2016-06-24 DIAGNOSIS — G4733 Obstructive sleep apnea (adult) (pediatric): Secondary | ICD-10-CM | POA: Diagnosis present

## 2016-06-24 DIAGNOSIS — A4151 Sepsis due to Escherichia coli [E. coli]: Principal | ICD-10-CM | POA: Diagnosis present

## 2016-06-24 DIAGNOSIS — E871 Hypo-osmolality and hyponatremia: Secondary | ICD-10-CM | POA: Diagnosis present

## 2016-06-24 DIAGNOSIS — T43225A Adverse effect of selective serotonin reuptake inhibitors, initial encounter: Secondary | ICD-10-CM | POA: Diagnosis present

## 2016-06-24 DIAGNOSIS — N179 Acute kidney failure, unspecified: Secondary | ICD-10-CM | POA: Diagnosis present

## 2016-06-24 DIAGNOSIS — R600 Localized edema: Secondary | ICD-10-CM | POA: Diagnosis not present

## 2016-06-24 DIAGNOSIS — G2581 Restless legs syndrome: Secondary | ICD-10-CM | POA: Diagnosis present

## 2016-06-24 DIAGNOSIS — I10 Essential (primary) hypertension: Secondary | ICD-10-CM | POA: Diagnosis present

## 2016-06-24 DIAGNOSIS — F329 Major depressive disorder, single episode, unspecified: Secondary | ICD-10-CM | POA: Diagnosis present

## 2016-06-24 DIAGNOSIS — J449 Chronic obstructive pulmonary disease, unspecified: Secondary | ICD-10-CM | POA: Diagnosis present

## 2016-06-24 DIAGNOSIS — B9620 Unspecified Escherichia coli [E. coli] as the cause of diseases classified elsewhere: Secondary | ICD-10-CM | POA: Diagnosis present

## 2016-06-24 DIAGNOSIS — Z96653 Presence of artificial knee joint, bilateral: Secondary | ICD-10-CM | POA: Diagnosis present

## 2016-06-24 DIAGNOSIS — N390 Urinary tract infection, site not specified: Secondary | ICD-10-CM | POA: Diagnosis not present

## 2016-06-24 DIAGNOSIS — I509 Heart failure, unspecified: Secondary | ICD-10-CM | POA: Diagnosis not present

## 2016-06-24 DIAGNOSIS — Y92009 Unspecified place in unspecified non-institutional (private) residence as the place of occurrence of the external cause: Secondary | ICD-10-CM | POA: Diagnosis not present

## 2016-06-24 HISTORY — DX: Essential (primary) hypertension: I10

## 2016-06-24 LAB — BRAIN NATRIURETIC PEPTIDE
B Natriuretic Peptide: 104.2 pg/mL — ABNORMAL HIGH (ref 0.0–100.0)
B Natriuretic Peptide: 155.1 pg/mL — ABNORMAL HIGH (ref 0.0–100.0)

## 2016-06-24 LAB — COMPREHENSIVE METABOLIC PANEL
ALT: 5 U/L — AB (ref 17–63)
AST: 34 U/L (ref 15–41)
Albumin: 3.6 g/dL (ref 3.5–5.0)
Alkaline Phosphatase: 56 U/L (ref 38–126)
Anion gap: 10 (ref 5–15)
BUN: 21 mg/dL — ABNORMAL HIGH (ref 6–20)
CHLORIDE: 89 mmol/L — AB (ref 101–111)
CO2: 25 mmol/L (ref 22–32)
CREATININE: 1.21 mg/dL (ref 0.61–1.24)
Calcium: 9 mg/dL (ref 8.9–10.3)
GFR calc non Af Amer: 56 mL/min — ABNORMAL LOW (ref >60)
Glucose, Bld: 93 mg/dL (ref 65–99)
POTASSIUM: 4.3 mmol/L (ref 3.5–5.1)
SODIUM: 124 mmol/L — AB (ref 135–145)
Total Bilirubin: 0.8 mg/dL (ref 0.3–1.2)
Total Protein: 6.5 g/dL (ref 6.5–8.1)

## 2016-06-24 LAB — URINALYSIS, ROUTINE W REFLEX MICROSCOPIC
BILIRUBIN URINE: NEGATIVE
Glucose, UA: 50 mg/dL — AB
Ketones, ur: NEGATIVE mg/dL
NITRITE: POSITIVE — AB
PH: 6 (ref 5.0–8.0)
Protein, ur: NEGATIVE mg/dL
SPECIFIC GRAVITY, URINE: 1.005 (ref 1.005–1.030)

## 2016-06-24 LAB — CBC WITH DIFFERENTIAL/PLATELET
Basophils Absolute: 0 10*3/uL (ref 0.0–0.1)
Basophils Relative: 0 %
Eosinophils Absolute: 0.3 10*3/uL (ref 0.0–0.7)
Eosinophils Relative: 3 %
HEMATOCRIT: 27.4 % — AB (ref 39.0–52.0)
Hemoglobin: 9.4 g/dL — ABNORMAL LOW (ref 13.0–17.0)
LYMPHS ABS: 1.3 10*3/uL (ref 0.7–4.0)
LYMPHS PCT: 10 %
MCH: 28.7 pg (ref 26.0–34.0)
MCHC: 34.3 g/dL (ref 30.0–36.0)
MCV: 83.8 fL (ref 78.0–100.0)
MONOS PCT: 8 %
Monocytes Absolute: 1 10*3/uL (ref 0.1–1.0)
Neutro Abs: 9.9 10*3/uL — ABNORMAL HIGH (ref 1.7–7.7)
Neutrophils Relative %: 79 %
Platelets: 169 10*3/uL (ref 150–400)
RBC: 3.27 MIL/uL — AB (ref 4.22–5.81)
RDW: 13.4 % (ref 11.5–15.5)
WBC: 12.5 10*3/uL — ABNORMAL HIGH (ref 4.0–10.5)

## 2016-06-24 LAB — I-STAT TROPONIN, ED: Troponin i, poc: 0 ng/mL (ref 0.00–0.08)

## 2016-06-24 MED ORDER — CEFTRIAXONE SODIUM 1 G IJ SOLR
1.0000 g | Freq: Once | INTRAMUSCULAR | Status: AC
  Administered 2016-06-24: 1 g via INTRAVENOUS
  Filled 2016-06-24: qty 10

## 2016-06-24 MED ORDER — GABAPENTIN 100 MG PO CAPS
200.0000 mg | ORAL_CAPSULE | Freq: Every day | ORAL | Status: DC
  Administered 2016-06-24 – 2016-06-25 (×2): 200 mg via ORAL
  Filled 2016-06-24 (×2): qty 2

## 2016-06-24 MED ORDER — ENOXAPARIN SODIUM 30 MG/0.3ML ~~LOC~~ SOLN: 30.0000 mg | SUBCUTANEOUS | Status: DC

## 2016-06-24 MED ORDER — ROPINIROLE HCL 1 MG PO TABS
1.0000 mg | ORAL_TABLET | Freq: Every evening | ORAL | Status: DC
  Administered 2016-06-25: 1 mg via ORAL
  Filled 2016-06-24 (×2): qty 1

## 2016-06-24 MED ORDER — SODIUM CHLORIDE 1 G PO TABS
1.0000 g | ORAL_TABLET | Freq: Three times a day (TID) | ORAL | Status: DC
  Administered 2016-06-26 (×2): 1 g via ORAL
  Filled 2016-06-24 (×3): qty 1

## 2016-06-24 MED ORDER — SODIUM CHLORIDE 0.9 % IV SOLN: INTRAVENOUS | Status: DC

## 2016-06-24 MED ORDER — CARBIDOPA-LEVODOPA 25-100 MG PO TABS
2.0000 | ORAL_TABLET | Freq: Three times a day (TID) | ORAL | Status: DC
  Administered 2016-06-24 – 2016-06-26 (×5): 2 via ORAL
  Filled 2016-06-24 (×5): qty 2

## 2016-06-24 MED ORDER — ROPINIROLE HCL 1 MG PO TABS
1.5000 mg | ORAL_TABLET | Freq: Every day | ORAL | Status: DC
  Administered 2016-06-24 – 2016-06-25 (×2): 1.5 mg via ORAL
  Filled 2016-06-24 (×2): qty 1

## 2016-06-24 MED ORDER — CITALOPRAM HYDROBROMIDE 20 MG PO TABS
10.0000 mg | ORAL_TABLET | Freq: Every day | ORAL | Status: DC
  Administered 2016-06-24 – 2016-06-25 (×2): 10 mg via ORAL
  Filled 2016-06-24 (×2): qty 1

## 2016-06-24 MED ORDER — ROPINIROLE HCL 1 MG PO TABS: 1.0000 mg | ORAL_TABLET | Freq: Two times a day (BID) | ORAL | Status: DC

## 2016-06-24 MED ORDER — DEXTROSE 5 % IV SOLN
1.0000 g | INTRAVENOUS | Status: DC
  Administered 2016-06-25: 1 g via INTRAVENOUS
  Filled 2016-06-24: qty 10

## 2016-06-24 MED ORDER — FUROSEMIDE 10 MG/ML IJ SOLN
20.0000 mg | Freq: Two times a day (BID) | INTRAMUSCULAR | Status: DC
  Administered 2016-06-24 – 2016-06-25 (×2): 20 mg via INTRAVENOUS
  Filled 2016-06-24 (×2): qty 2

## 2016-06-24 MED ORDER — SODIUM CHLORIDE 0.9 % IV BOLUS (SEPSIS)
500.0000 mL | Freq: Once | INTRAVENOUS | Status: AC
Start: 2016-06-24 — End: 2016-06-24
  Administered 2016-06-24: 500 mL via INTRAVENOUS

## 2016-06-24 MED ORDER — SODIUM CHLORIDE 0.9 % IV SOLN
INTRAVENOUS | Status: DC
  Administered 2016-06-24: 19:00:00 via INTRAVENOUS

## 2016-06-24 MED ORDER — TAMSULOSIN HCL 0.4 MG PO CAPS
0.4000 mg | ORAL_CAPSULE | Freq: Every day | ORAL | Status: DC
  Administered 2016-06-24 – 2016-06-25 (×2): 0.4 mg via ORAL
  Filled 2016-06-24 (×2): qty 1

## 2016-06-24 MED ORDER — ACETAMINOPHEN 325 MG PO TABS
650.0000 mg | ORAL_TABLET | Freq: Four times a day (QID) | ORAL | Status: DC | PRN
Start: 2016-06-24 — End: 2016-06-26
  Administered 2016-06-24 – 2016-06-25 (×3): 650 mg via ORAL
  Filled 2016-06-24 (×3): qty 2

## 2016-06-24 MED ORDER — ASPIRIN EC 81 MG PO TBEC
81.0000 mg | DELAYED_RELEASE_TABLET | Freq: Every day | ORAL | Status: DC
  Administered 2016-06-25 – 2016-06-26 (×2): 81 mg via ORAL
  Filled 2016-06-24 (×2): qty 1

## 2016-06-24 MED ORDER — LEVOTHYROXINE SODIUM 88 MCG PO TABS
88.0000 ug | ORAL_TABLET | Freq: Every day | ORAL | Status: DC
  Administered 2016-06-25 – 2016-06-26 (×2): 88 ug via ORAL
  Filled 2016-06-24 (×2): qty 1

## 2016-06-24 MED ORDER — ENOXAPARIN SODIUM 40 MG/0.4ML ~~LOC~~ SOLN
40.0000 mg | SUBCUTANEOUS | Status: DC
  Administered 2016-06-24 – 2016-06-25 (×2): 40 mg via SUBCUTANEOUS
  Filled 2016-06-24 (×2): qty 0.4

## 2016-06-24 MED ORDER — HYDRALAZINE HCL 50 MG PO TABS
50.0000 mg | ORAL_TABLET | Freq: Three times a day (TID) | ORAL | Status: DC
  Administered 2016-06-24 – 2016-06-26 (×5): 50 mg via ORAL
  Filled 2016-06-24 (×7): qty 1

## 2016-06-24 NOTE — ED Triage Notes (Addendum)
Pt has had intermittent high blood pressure for the past few weeks. Has had recent medication changes for this. No CP or SOB. Also has edema in legs and abd.  BP 83/51 in triage.

## 2016-06-24 NOTE — ED Notes (Signed)
XR at bedside

## 2016-06-24 NOTE — Progress Notes (Signed)
PHARMACY - ENOXAPARIN  Lovenox  sq q24h for VTE prophylaxis ordered and pharmacy asked to adjust dose as needed.  Weight = 78.5 kg BMI = 26 CrCl = 49.5 ml/min  Assessment:  BMI < 30 and CrCl > 30 ml/min; therefore no reduction of enoxaparin dose indicated  Plan: Change Enoxaparin to  sq q24h for VTE prophylaxis.

## 2016-06-24 NOTE — ED Notes (Signed)
Pt cannot use restroom at this time, aware urine specimen is needed.  

## 2016-06-24 NOTE — Telephone Encounter (Signed)
Patient's daughter called, concerned about outside urine culture results.  Results are not visible on CareEverywhere.   Daughter will fax results to [phone] this evening when she gets home from work.  She is unable to logon to MyChart at work.  RN will notify Dr. Daiva Eves that culture results are coming in this evening on Triage Fax machine.

## 2016-06-24 NOTE — ED Notes (Signed)
Two sets of blood cultures sent to the lab

## 2016-06-24 NOTE — Telephone Encounter (Signed)
If she never had symptoms I have zero interest in the culture. I dont treat asymptomatic bacteruria or pyuria

## 2016-06-24 NOTE — ED Provider Notes (Signed)
[facility]-EMERGENCY DEPT Provider Note   CSN: [id number] Arrival date & time: 06/24/16  1329     History   Chief Complaint Chief Complaint  Patient presents with  . Hypotension    HPI Bryan Vega is a 76 y.o. male.  HPI Pt has a history of labile BP.  Often low in the AM, even into the 70s,  and then high in the evenings up to 180.  In the last few days he started taking labetalol and hydralizine for BP.  He is also taking lasix for abdominal and leg swelling. Today he slipped out of his char and felt weak.  He did take his labetalol this am.  BP is low in the ED.    He had a urine tested recently and was told he had a UTI.  He had abx called in but has not started them yet  No fevers.  He has been coughing and wheezing.  He does use an inhaler.   No complaints of urinary symptoms. Past Medical History:  Diagnosis Date  . COPD (chronic obstructive pulmonary disease) (HCC)   . Hyperlipidemia   . Hypertension   . Hypothyroidism   . Neurogenic bladder   . Parkinson's disease (HCC)   . 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 Pyelonephritis, acute 06/24/2016  . 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 Hyponatremia 01/21/2016  . Anemia 01/21/2016  . Infection due to urethral catheter (HCC)   . Sepsis secondary to UTI (HCC) 01/20/2016  . Hyperlipidemia 01/20/2016  . Hypothyroidism 01/20/2016  . Hypotension 12/20/2015  . Lower urinary tract infectious disease 12/20/2015  . Generalized weakness 12/20/2015  . Parkinson disease (HCC) 12/20/2015  . Neurogenic bladder 12/20/2015    Past Surgical History:  Procedure Laterality Date  . APPENDECTOMY    . JOINT REPLACEMENT    . 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 Authorizing Provider  acetaminophen (TYLENOL) 500 MG tablet Take 1,000 mg by mouth every 6 (six) hours as needed for  fever.   Yes Historical Provider, MD  aspirin EC 81 MG tablet Take 81 mg by mouth daily.   Yes Historical Provider, MD  atorvastatin (LIPITOR) 20 MG tablet Take 20 mg by mouth at bedtime.    Yes Historical Provider, MD  CALCIUM PO Take 1 tablet by mouth daily.   Yes Historical Provider, MD  carbidopa-levodopa (SINEMET IR) 25-100 MG tablet Take 2 tablets by mouth 3 (three) times daily.   Yes Historical Provider, MD  citalopram (CELEXA) 20 MG tablet Take 20 mg by mouth at bedtime.    Yes Historical Provider, MD  docusate sodium (COLACE) 100 MG capsule Take 100 mg by mouth daily.    Yes Historical Provider, MD  ferrous sulfate 325 (65 FE) MG tablet Take 325 mg by mouth daily with breakfast.   Yes Historical Provider, MD  furosemide (LASIX) 20 MG tablet Take 20 mg by mouth daily. 06/17/16 07/17/16 Yes Historical Provider, MD  gabapentin (NEURONTIN) 100 MG capsule Take 200 mg by mouth daily after supper.    Yes Historical Provider, MD  levothyroxine (SYNTHROID, LEVOTHROID) 88 MCG tablet Take 88 mcg by mouth daily before breakfast.   Yes Historical Provider, MD  Multiple Vitamins-Minerals (MULTIVITAMIN GUMMIES ADULT PO) Take 1 tablet by mouth daily.   Yes Historical Provider, MD  Omega-3 Fatty Acids (FISH OIL PO) Take 1 capsule by mouth daily.   Yes Historical Provider, MD  rOPINIRole (REQUIP) 0.5 MG tablet Take 1-1.5 mg by mouth 2 (two) times daily. Take  in the evening and 1.5mg s at bedtime   Yes Historical Provider, MD  tamsulosin (FLOMAX) 0.4 MG CAPS capsule Take 0.4 mg by mouth at bedtime.    Yes Historical Provider, MD  fosfomycin (MONUROL) 3 g PACK Take 3 g by mouth as directed. Patient not taking: Reported on 06/24/2016 01/23/16   Randall Hiss, MD    Family History Family History  Problem Relation Age of Onset  . Family history unknown: Yes    Social History Social History  Substance Use Topics  . Smoking status: Former Smoker    Packs/day: 0.50    Years: 20.00    Types:  Cigarettes    Quit date: 04/20/1996  . Smokeless tobacco: Never Used  . Alcohol use Yes     Allergies   Patient has no known allergies.   Review of Systems Review of Systems  All other systems reviewed and are negative.    Physical Exam Updated Vital Signs BP (!) 127/54   Pulse 75   Temp 98.4 F (36.9 C) (Oral)   Resp (!) 23   Ht  (1.727 m)   Wt 81.2 kg   SpO2 99%   BMI 27.22 kg/m   Physical Exam  Constitutional: No distress.  HENT:  Head: Normocephalic and atraumatic.  Right Ear: External ear normal.  Left Ear: External ear normal.  Eyes: Conjunctivae are normal. Right eye exhibits no discharge. Left eye exhibits no discharge. No scleral icterus.  Neck: Neck supple. No tracheal deviation present.  Cardiovascular: Normal rate, regular rhythm and intact distal pulses.   Pulmonary/Chest: Effort normal and breath sounds normal. No stridor. No respiratory distress. He has no wheezes. He has no rales.  Abdominal: Soft. Bowel sounds are normal. He exhibits no distension. There is no tenderness. There is no rebound and no guarding.  Musculoskeletal: He exhibits no edema or tenderness.  Neurological: He is alert. He displays no tremor. No cranial nerve deficit (no facial droop, extraocular movements intact, no slurred speech) or sensory deficit. He displays no seizure activity.  Skin: Skin is warm and dry. No rash noted. He is not diaphoretic.  Psychiatric: He has a normal mood and affect.  Nursing note and vitals reviewed.    ED Treatments / Results  Labs (all labs ordered are listed, but only abnormal results are displayed) Labs Reviewed  COMPREHENSIVE METABOLIC PANEL - Abnormal; Notable for the following:       Result Value   Sodium 124 (*)    Chloride 89 (*)    BUN 21 (*)    ALT 5 (*)    GFR calc non Af Amer 56 (*)    All other components within normal limits  CBC WITH DIFFERENTIAL/PLATELET - Abnormal; Notable for the following:    WBC 12.5 (*)    RBC  3.27 (*)    Hemoglobin 9.4 (*)    HCT 27.4 (*)    Neutro Abs 9.9 (*)    All other components within normal limits  URINALYSIS, ROUTINE W REFLEX MICROSCOPIC - Abnormal; Notable for the following:    APPearance CLOUDY (*)    Glucose, UA 50 (*)    Hgb urine dipstick SMALL (*)    Nitrite POSITIVE (*)    Leukocytes, UA LARGE (*)    Bacteria, UA  RARE (*)    Squamous Epithelial / LPF 0-5 (*)    All other components within normal limits  BRAIN NATRIURETIC PEPTIDE - Abnormal; Notable for the following:    B Natriuretic Peptide 104.2 (*)    All other components within normal limits  CULTURE, BLOOD (ROUTINE X 2)  CULTURE, BLOOD (ROUTINE X 2)  URINE CULTURE  I-STAT CG4 LACTIC ACID, ED  I-STAT TROPOININ, ED    EKG  EKG Interpretation  Date/Time:  Tuesday June 24 2016 14:46:20 EDT Ventricular Rate:  66 PR Interval:    QRS Duration: 162 QT Interval:  468 QTC Calculation: 487 R Axis:   20 Text Interpretation:  Sinus rhythm Prolonged PR interval Nonspecific intraventricular conduction delay Since last tracing rate slower Confirmed by Alexandria Current  MD-J, Lorynn Moeser ([id number]) on 06/24/2016 2:59:14 PM Also confirmed by Kaladin Noseworthy  MD-J, Qualyn Oyervides [phone]), editor Valentina Lucks CT, Jola Babinski [phone])  on 06/24/2016 3:09:45 PM       Radiology Dg Chest Port 1 View  Result Date: 06/24/2016 CLINICAL DATA:  Shortness of breath and hypotension. EXAM: PORTABLE CHEST 1 VIEW COMPARISON:  Single-view of the chest 01/20/2016. PA and lateral chest 12/20/2015. FINDINGS: The lungs are clear. Heart size is normal. No pneumothorax or pleural effusion. No acute bony abnormality. IMPRESSION: No acute disease. Electronically Signed   By: Drusilla Kanner M.D.   On: 06/24/2016 14:52    Procedures Procedures (including critical care time)  Medications Ordered in ED Medications  cefTRIAXone (ROCEPHIN) 1 g in dextrose 5 % 50 mL IVPB (1 g Intravenous New Bag/Given 06/24/16 1709)  0.9 %  sodium chloride infusion (not administered)  sodium chloride 0.9 %  bolus 500 mL (500 mLs Intravenous New Bag/Given 06/24/16 1709)     Initial Impression / Assessment and Plan / ED Course  I have reviewed the triage vital signs and the nursing notes.  Pertinent labs & imaging results that were available during my care of the patient were reviewed by me and considered in my medical decision making (see chart for details).  Clinical Course as of Jun 24 1729  Tue Jun 24, 2016  1415 Will hold off on large fluid bolus initially.  No clear sign of infection to suggest sepsis.  Pt does have history of labile BP and hypotension.  Pt also has signs of fluid overload with peripheral edema  [JK]  1701 Lactic acid 0.9  [JK]    Clinical Course User Index [JK] Linwood Dibbles, MD   Patient presented to the emergency room with hypotension. He has a history of Autonomic dysfunction and has had very labile blood pressures. Patient also has to self catheterize because of neurogenic bladder and has had urinary tract infections in the past. Laboratory tests are consistent with a urinary tract infection. His blood pressures improved without any significant fluid resuscitation and his lactic acid level is normal. I doubt sepsis.  Patient will be admitted to the hospital for IV antibiotics and monitoring.  Final Clinical Impressions(s) / ED Diagnoses   Final diagnoses:  Acute cystitis without hematuria  Hyponatremia  Transient hypotension    New Prescriptions New Prescriptions   No medications on file     Linwood Dibbles, MD 06/24/16 1731

## 2016-06-24 NOTE — H&P (Signed)
[facility] History and Physical  [name] JQB:[id number] DOB: 23-Apr-1940 DOA: 06/24/2016  Referring physician: Dr. Tomi Bamberger PCP: No PCP Per Patient  Specialists:  none  Chief Complaint:   HPI: Bryan Vega is a 76 y.o. male came to [facility] ed 06/24/2016  COPD former smoker HLD Hypothyroid RLS Osteoarthritis Mild OSA Parkinson's diag 2012 managed by Dr. Sharon Mt complicated by Neurogenic bladder-self catheterizes.  Prior MDI Klebsiella in the past  Has had some autonomic dysfunction with very high pressures in the OP as high as 220 sys and lows of 60-70 in the mornings Apparently had a clean catch urine cult at some point as part of PCP work-up on around 3/27 and 2/2 to labile BP, Was placed on labetalol  Also has noticed abdominal and Lower extremity pitting edema that is new-as well as prn lasix which was taken ~ 4 x since the last 1-2 weeks Has had ECHO in 2017 as well which per report showed normal ef He has never had any concern for liver dysfucntion and was a mild drinkler in the past  Famiyl states when his BP is elevated, he does drool and had neck stiffness and dicomfort  Sodium 124 bun cre 21/1.2 wbc 12.5 hb 9.4 plt 169 cxr wnl ekg wnl no anomaly   Review of Systems: The patient denies melena rash chills rigor cp sob fever sputum n v rigor ha  Past Medical History:  Diagnosis Date  . COPD (chronic obstructive pulmonary disease) ([place])   . Hyperlipidemia   . Hypertension   . Hypothyroidism   . Neurogenic bladder   . Parkinson's disease ([place])   . 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 APPENDECTOMY    . JOINT REPLACEMENT    . 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 No narrative on file    No Known Allergies  Family History  Problem Relation Age of Onset  . Family history unknown: Yes   Prior to Admission medications   Medication  Sig Start Date End Date Taking? Authorizing Provider  acetaminophen (TYLENOL) 500 MG tablet Take 1,000 mg by mouth every 6 (six) hours as needed for fever.   Yes Historical Provider, MD  aspirin EC 81 MG tablet Take 81 mg by mouth daily.   Yes Historical Provider, MD  atorvastatin (LIPITOR) 20 MG tablet Take 20 mg by mouth at bedtime.    Yes Historical Provider, MD  CALCIUM PO Take 1 tablet by mouth daily.   Yes Historical Provider, MD  carbidopa-levodopa (SINEMET IR) 25-100 MG tablet Take 2 tablets by mouth 3 (three) times daily.   Yes Historical Provider, MD  citalopram (CELEXA) 20 MG tablet Take 20 mg by mouth at bedtime.    Yes Historical Provider, MD  docusate sodium (COLACE) 100 MG capsule Take 100 mg by mouth daily.    Yes Historical Provider, MD  ferrous sulfate 325 (65 FE) MG tablet Take 325 mg by mouth daily with breakfast.   Yes Historical Provider, MD  furosemide (LASIX) 20 MG tablet Take 20 mg by mouth daily. 06/17/16 07/17/16 Yes Historical Provider, MD  gabapentin (NEURONTIN) 100 MG capsule Take 200 mg by mouth daily after supper.    Yes Historical Provider, MD  levothyroxine (SYNTHROID, LEVOTHROID) 88 MCG tablet Take 88 mcg by mouth daily before breakfast.   Yes Historical Provider, MD  Multiple Vitamins-Minerals (MULTIVITAMIN GUMMIES ADULT PO)  Take 1 tablet by mouth daily.   Yes Historical Provider, MD  Omega-3 Fatty Acids (FISH OIL PO) Take 1 capsule by mouth daily.   Yes Historical Provider, MD  rOPINIRole (REQUIP) 0.5 MG tablet Take 1-1.5 mg by mouth 2 (two) times daily. Take 20m in the evening and 1.556m at bedtime   Yes Historical Provider, MD  tamsulosin (FLOMAX) 0.4 MG CAPS capsule Take 0.4 mg by mouth at bedtime.    Yes Historical Provider, MD  fosfomycin (MONUROL) 3 g PACK Take 3 g by mouth as directed. Patient not taking: Reported on 06/24/2016 01/23/16   CoTruman HaywardMD   Physical Exam: Vitals:   06/24/16 1600 06/24/16 1610 06/24/16 1630 06/24/16 1700  BP: (!)  144/67 (!) 144/67 (!) 113/53 (!) 127/54  Pulse: 72 77 75 75  Resp: 17 (!) 26 (!) 26 (!) 23  Temp:      TempSrc:      SpO2: 99% 99% 97% 99%  Weight:      Height:        Drooling from the R side of mouth, no twsiting of the mouth  Verbalizes but poor speech. Flat affect eomi ncat neck soft supple cta b s1 s 2no m/r/g abd soft nt nd no rebound Neuro able to lift RLE and RUE but seems a little wekaer than L side Skin has scars of prior knee surgery 2 + pitting edema  Labs on Admission:  Basic Metabolic Panel:  Recent Labs Lab 06/24/16 1426  NA 124*  K 4.3  CL 89*  CO2 25  GLUCOSE 93  BUN 21*  CREATININE 1.21  CALCIUM 9.0   Liver Function Tests:  Recent Labs Lab 06/24/16 1426  AST 34  ALT 5*  ALKPHOS 56  BILITOT 0.8  PROT 6.5  ALBUMIN 3.6   No results for input(s): LIPASE, AMYLASE in the last 168 hours. No results for input(s): AMMONIA in the last 168 hours. CBC:  Recent Labs Lab 06/24/16 1426  WBC 12.5*  NEUTROABS 9.9*  HGB 9.4*  HCT 27.4*  MCV 83.8  PLT 169   Cardiac Enzymes: No results for input(s): CKTOTAL, CKMB, CKMBINDEX, TROPONINI in the last 168 hours.  BNP (last 3 results)  Recent Labs  06/24/16 1426  BNP 104.2*    ProBNP (last 3 results) No results for input(s): PROBNP in the last 8760 hours.  CBG: No results for input(s): GLUCAP in the last 168 hours.  Radiological Exams on Admission: Dg Chest Port 1 View  Result Date: 06/24/2016 CLINICAL DATA:  Shortness of breath and hypotension. EXAM: PORTABLE CHEST 1 VIEW COMPARISON:  Single-view of the chest 01/20/2016. PA and lateral chest 12/20/2015. FINDINGS: The lungs are clear. Heart size is normal. No pneumothorax or pleural effusion. No acute bony abnormality. IMPRESSION: No acute disease. Electronically Signed   By: ThInge Rise.D.   On: 06/24/2016 14:52    Assessment/Plan Active Problems:   * No active hospital problems. *    [facility]2 [facility]  Follow cath specimen done here-start ceftriaxone-obtain preliminary culture and might need ID input re: course of ABX.  Trend WBC and lactic acid.  Cautious use IVf  Parkinson's with dysautonomia-continue Sine-met IR Tid-make sure on usual home schedule.   Will need in and out cath q 12.  Would not place indwelling foley--Might not be able to remove  Hypo-hypertension-Hydralazine seems to work well for patient per Daughter-will use Hydralazine 50 mg tid and hold  for bp below 120 sys  Hypervolemic Hyponatremia-etiology unclear? Cerebral salt wasting vs  Get UOSM, Urine OSM, TSH and Cortisol.  Urine showed sp grav 1.005 which is low- vol overload with exs free water-- no proteinuria making nephritic or nephrotic syndrome less likely--contributing could also be citalopram-will cut back dose from 20-->10 mg.  LE swelling-see above-Get Echo and [place] to rule out both cardiac and hepatic causes.  Get BNP r/o HF.  For now start low dose lasix 20 IV daily  Mild AKI-Cret slight up-will give IV fluid for Raising sodium and for repletion. consider d/c IVF in am and starting salt tablets if fluid status net +  OSA-mild-not on CPAP or bipap?  Mild per prior testing    Full code Inpatient d/w wife son and daughter in [place] who is NP-she prefers to be first point of contact  [place] Daughter   [phone]     Time spent: 19  Verlon Au [facility] Pager [phone]  If 7PM-7AM, please contact night-coverage [email or web address] Password [email or web address] 06/24/2016, 5:10 PM

## 2016-06-24 NOTE — ED Notes (Signed)
Hospialist at bedside.

## 2016-06-25 ENCOUNTER — Inpatient Hospital Stay (HOSPITAL_COMMUNITY): Payer: Medicare Other

## 2016-06-25 DIAGNOSIS — I509 Heart failure, unspecified: Secondary | ICD-10-CM

## 2016-06-25 DIAGNOSIS — B9620 Unspecified Escherichia coli [E. coli] as the cause of diseases classified elsewhere: Secondary | ICD-10-CM

## 2016-06-25 DIAGNOSIS — N3000 Acute cystitis without hematuria: Secondary | ICD-10-CM

## 2016-06-25 DIAGNOSIS — N390 Urinary tract infection, site not specified: Secondary | ICD-10-CM

## 2016-06-25 DIAGNOSIS — E871 Hypo-osmolality and hyponatremia: Secondary | ICD-10-CM

## 2016-06-25 LAB — CBC
HEMATOCRIT: 26.3 % — AB (ref 39.0–52.0)
HEMOGLOBIN: 9.2 g/dL — AB (ref 13.0–17.0)
MCH: 30.1 pg (ref 26.0–34.0)
MCHC: 35 g/dL (ref 30.0–36.0)
MCV: 85.9 fL (ref 78.0–100.0)
Platelets: 147 10*3/uL — ABNORMAL LOW (ref 150–400)
RBC: 3.06 MIL/uL — ABNORMAL LOW (ref 4.22–5.81)
RDW: 13.7 % (ref 11.5–15.5)
WBC: 8.5 10*3/uL (ref 4.0–10.5)

## 2016-06-25 LAB — COMPREHENSIVE METABOLIC PANEL
ALK PHOS: 52 U/L (ref 38–126)
ALT: 7 U/L — ABNORMAL LOW (ref 17–63)
ANION GAP: 8 (ref 5–15)
AST: 26 U/L (ref 15–41)
Albumin: 3.1 g/dL — ABNORMAL LOW (ref 3.5–5.0)
BILIRUBIN TOTAL: 0.4 mg/dL (ref 0.3–1.2)
BUN: 20 mg/dL (ref 6–20)
CO2: 27 mmol/L (ref 22–32)
Calcium: 8.8 mg/dL — ABNORMAL LOW (ref 8.9–10.3)
Chloride: 97 mmol/L — ABNORMAL LOW (ref 101–111)
Creatinine, Ser: 1.09 mg/dL (ref 0.61–1.24)
GFR calc Af Amer: >60 mL/min (ref >60)
GFR calc non Af Amer: >60 mL/min (ref >60)
Glucose, Bld: 117 mg/dL — ABNORMAL HIGH (ref 65–99)
POTASSIUM: 3.4 mmol/L — AB (ref 3.5–5.1)
Sodium: 132 mmol/L — ABNORMAL LOW (ref 135–145)
TOTAL PROTEIN: 5.8 g/dL — AB (ref 6.5–8.1)

## 2016-06-25 LAB — ECHOCARDIOGRAM COMPLETE
Height: 68 in
Weight: 2768.98 oz

## 2016-06-25 LAB — SODIUM, URINE, RANDOM: Sodium, Ur: 20 mmol/L

## 2016-06-25 LAB — OSMOLALITY, URINE: OSMOLALITY UR: 121 mosm/kg — AB (ref 300–900)

## 2016-06-25 MED ORDER — POTASSIUM CHLORIDE CRYS ER 20 MEQ PO TBCR
40.0000 meq | EXTENDED_RELEASE_TABLET | ORAL | Status: AC
  Administered 2016-06-25 (×2): 40 meq via ORAL
  Filled 2016-06-25 (×2): qty 2

## 2016-06-25 MED ORDER — FUROSEMIDE 10 MG/ML IJ SOLN
20.0000 mg | Freq: Every day | INTRAMUSCULAR | Status: DC
  Administered 2016-06-26: 20 mg via INTRAVENOUS
  Filled 2016-06-25: qty 2

## 2016-06-25 MED ORDER — SENNOSIDES-DOCUSATE SODIUM 8.6-50 MG PO TABS
2.0000 | ORAL_TABLET | Freq: Every day | ORAL | Status: DC
  Administered 2016-06-25: 2 via ORAL
  Filled 2016-06-25: qty 2

## 2016-06-25 NOTE — Progress Notes (Signed)
PROGRESS NOTE [facility]   Bryan Vega   WUJ:[id number] DOB: 07/18/1940  DOA: 06/24/2016 PCP: No PCP Per Patient   Brief Narrative:  76 year old male with past medical history of Parkinson, COPD neurogenic bladder, self catheterize twice a day (multiple UTIs including ESBL in the past) presented to the emergency department with complaint of weakness and lethargy. Patient was found to have hyponatremia and a UTI. Admitted for IV antibiotic and hydration.  Of note patient had lower extremity pitting edema which approximately for the past 1-2 weeks he has been taking Lasix for. Echo done 2017 show grade 1 diastolic dysfunction with normal EF.  Subjective: Patient seen and examined with son at bedside. Patient reported that he is feeling well and have no complaints this morning. Per son patient is at baseline. No acute events overnight sodium has improved white count decreasing.  Assessment & Plan: Sepsis secondary to UTI - urine culture growing Escherichia coli more than 100,000 colonies, sepsis physiology resolved Continue Rocephin for now and follow-up sensitivities Can d/c IV fluids. Follow up blood cultures  Hyponatremia - unclear etiology at this time  Improved with IVF and sodium salt  Monitor in AM   Can follow up as outpatient   Leg swelling - unclear etiology - ECHO has no changes from 2017, Improving with diuresis  BNP 155, ECHO with no changes from 2017 - Normal EF with G1DD Continue Lasix for now   DVT prophylaxis: Lovenox  Code Status: FULL  Family Communication: Son at bedside, Daughter via phone  Disposition Plan: Possible d/c tomorow if sensitivities are back   Consultants:   None   Procedures:   ECHO 06/25/16 ------------------------------------------------------------------- Study Conclusions  - Left ventricle: The cavity size was normal. Systolic function was   normal. The estimated ejection fraction was in the range of 60%   to 65%. Wall motion  was normal; there were no regional wall   motion abnormalities. Doppler parameters are consistent with   abnormal left ventricular relaxation (grade 1 diastolic   dysfunction). Doppler parameters are consistent with elevated   ventricular end-diastolic filling pressure. - Aortic valve: Trileaflet; normal thickness leaflets. There was no   regurgitation. - Mitral valve: There was mild regurgitation. - Left atrium: The atrium was mildly dilated. - Right ventricle: Systolic function was normal. - Right atrium: The atrium was normal in size. - Tricuspid valve: There was mild regurgitation. - Pulmonary arteries: Systolic pressure was within the normal   range. - Inferior vena cava: The vessel was normal in size. - Pericardium, extracardiac: There was no pericardial effusion.   Antimicrobials:  Rocephin 06/24/16 -   Objective: Vitals:   06/24/16 1830 06/24/16 1900 06/24/16 2105 06/25/16 0505  BP: (!) 144/58 (!) 143/71 106/81 92/68  Pulse: 75 74 75 65  Resp: (!) Temp:  98.4 F (36.9 C) 98.6 F (37 C) 97.7 F (36.5 C)  TempSrc:  Oral Oral Oral  SpO2: 99% 100% 100% 98%  Weight:  78.5 kg (173 lb 1 oz)    Height:   (1.727 m)      Intake/Output Summary (Last 24 hours) at 06/25/16 1645 Last data filed at 06/25/16 1000  Gross per 24 hour  Intake             1695 ml  Output             1521 ml  Net              174  ml   Filed Weights   06/24/16 1426 06/24/16 1900  Weight: 81.2 kg (179 lb) 78.5 kg (173 lb 1 oz)    Examination:  General exam: Appears calm and comfortable  HEENT: OP moist and clear Respiratory system: Clear to auscultation. No wheezes,crackle or rhonchi Cardiovascular system: S1 & S2 heard, RRR. No JVD, murmurs, rubs or gallops Gastrointestinal system: Abdomen is nondistended, soft and nontender. Normal bowel sounds heard. Central nervous system: AAOx3, dysarthria, R slight weaker than left all is chronic, resting tremor. Extremities: 1+  pitting edema b/l LE   Skin: No rashes   Data Reviewed: I have personally reviewed following labs and imaging studies  CBC:  Recent Labs Lab 06/24/16 1426 06/25/16 0536  WBC 12.5* 8.5  NEUTROABS 9.9*  --   HGB 9.4* 9.2*  HCT 27.4* 26.3*  MCV 83.8 85.9  PLT 169 147*   Basic Metabolic Panel:  Recent Labs Lab 06/24/16 1426 06/25/16 0536  NA 124* 132*  K 4.3 3.4*  CL 89* 97*  CO2 25 27  GLUCOSE 93 117*  BUN 21* 20  CREATININE 1.21 1.09  CALCIUM 9.0 8.8*   GFR: Estimated Creatinine Clearance: 54.9 mL/min (by C-G formula based on SCr of 1.09 mg/dL). Liver Function Tests:  Recent Labs Lab 06/24/16 1426 06/25/16 0536  AST 34 26  ALT 5* 7*  ALKPHOS 56 52  BILITOT 0.8 0.4  PROT 6.5 5.8*  ALBUMIN 3.6 3.1*     Recent Results (from the past 240 hour(s))  Blood Culture (routine x 2)     Status: None (Preliminary result)   Collection Time: 06/24/16  2:14 PM  Result Value Ref Range Status   Specimen Description BLOOD RIGHT HAND  Final   Special Requests   Final    Blood Culture results may not be optimal due to an inadequate volume of blood received in culture bottles   Culture   Final    NO GROWTH < 24 HOURS Performed at [facility], [address], [place], [place] [address]    Report Status PENDING  Incomplete  Urine culture     Status: Abnormal (Preliminary result)   Collection Time: 06/24/16  2:14 PM  Result Value Ref Range Status   Specimen Description URINE, RANDOM  Final   Special Requests NONE  Final   Culture (A)  Final    >=100,000 COLONIES/mL ESCHERICHIA COLI SUSCEPTIBILITIES TO FOLLOW Performed at [facility], [address] [place], [place] [address]    Report Status PENDING  Incomplete  Blood Culture (routine x 2)     Status: None (Preliminary result)   Collection Time: 06/24/16  2:19 PM  Result Value Ref Range Status   Specimen Description BLOOD LEFT ANTECUBITAL  Final   Special Requests Blood Culture adequate volume  Final     Culture   Final    NO GROWTH < 24 HOURS Performed at [facility], [address] [place], [place] [address]    Report Status PENDING  Incomplete     Radiology Studies: Dg Chest Port 1 View  Result Date: 06/24/2016 CLINICAL DATA:  Shortness of breath and hypotension. EXAM: PORTABLE CHEST 1 VIEW COMPARISON:  Single-view of the chest 01/20/2016. PA and lateral chest 12/20/2015. FINDINGS: The lungs are clear. Heart size is normal. No pneumothorax or pleural effusion. No acute bony abnormality. IMPRESSION: No acute disease. Electronically Signed   By: Drusilla Kanner M.D.   On: 06/24/2016 14:52    Scheduled Meds: .  aspirin EC  81 mg Oral Daily  . carbidopa-levodopa  2 tablet Oral TID  . cefTRIAXone (ROCEPHIN)  IV  1 g Intravenous Q24H  . citalopram  10 mg Oral QHS  . enoxaparin (LOVENOX) injection  40 mg Subcutaneous Q24H  . furosemide  20 mg Intravenous Q12H  . gabapentin  200 mg Oral QPC supper  . hydrALAZINE  50 mg Oral Q8H  . levothyroxine  88 mcg Oral QAC breakfast  . rOPINIRole  1 mg Oral QPM  . rOPINIRole  1.5 mg Oral QHS  . sodium chloride  1 g Oral TID WC  . tamsulosin  0.4 mg Oral QHS   Continuous Infusions:   LOS: 1 day    Latrelle Dodrill, MD Pager: Text Page via [email or web address]  [phone]  If 7PM-7AM, please contact night-coverage [email or web address] Password [email or web address] 06/25/2016, 4:45 PM

## 2016-06-25 NOTE — Progress Notes (Signed)
  Echocardiogram 2D Echocardiogram has been performed.  Bryan Vega M 06/25/2016, 11:46 AM

## 2016-06-25 NOTE — Progress Notes (Signed)
PT Cancellation Note  Patient Details Name: Bryan Vega MRN: [id number] DOB: 12-29-1940   Cancelled Treatment:    Reason Eval/Treat Not Completed: Patient at procedure or test/unavailable. Pt getting ECHO in room.  Will check back as schedule permits. Clydie Braun L. Katrinka Blazing, [place] Pager [phone] 06/25/2016    [name] 06/25/2016, 11:18 AM

## 2016-06-25 NOTE — Progress Notes (Signed)
Per Merdis Delay, NP continue condom catheter over night as patient can urinate himself, bladder scan every 4-6 hours and if residual greater than , in and out cath patient. Will follow orders.

## 2016-06-25 NOTE — Progress Notes (Signed)
BSE completed- full report to follow - Pt with subtle indications of dysphagia likely due to his Parkinson's disease. Educated pt and spouse to aspiration precautions and importance of phonatory/cough strength for airway protection. Recommend continue diet.   Donavan Burnet, MS [facility] SLP    [phone]

## 2016-06-25 NOTE — Evaluation (Signed)
Clinical/Bedside Swallow Evaluation Patient Details  Name: Bryan Vega MRN: [id number] Date of Birth: 28-Mar-1940  Today's Date: 06/25/2016 Time: SLP Start Time (ACUTE ONLY): 1205 SLP Stop Time (ACUTE ONLY): 1235 SLP Time Calculation (min) (ACUTE ONLY): 30 min  Past Medical History:  Past Medical History:  Diagnosis Date  . COPD (chronic obstructive pulmonary disease) (HCC)   . Hyperlipidemia   . Hypertension   . Hypothyroidism   . Neurogenic bladder   . Parkinson's disease (HCC)   . 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 APPENDECTOMY    . JOINT REPLACEMENT    . 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  Pt's reports cough x several weeks - denies reflux issues.  PMH + for Parkinson's disease- diagnosed 2012, COPD- prior smoker, neurogenic bladder- self caths, recurrent UTI, HTN.  Pt with autonomic bp changes - reportedly when pressure is high pt neck stiffens and he drools. Recent CXRs have been negative.  Swallow evaluation ordered. Spouse in room with pt.    Assessment / Plan / Recommendation Clinical Impression  Pt presents with symptoms of mild oropharyngeal dysphagia likely due to his Parkinson's disease.  Pt eating lunch upon SLP arrival to room- feeding himself.  CN exam largely unremarkable but pt does appear have lingual writhing and thrusting- ? due to labial and lingual weakness/discoordination.   No drooling apparent today.  Throat clearing intermittently noted during po - pt/wife report this to occur prior to admission.  No oral residuals with po observed.     With 4th bolus of viscous pancake, pt presented with post-swallow cough.  Pt and spouse admit some foods cause him to cough- advised him to use caution with those items.  Pt has not had weight loss, pneumonias nor required heimlich manuever.     Mrs. Hitchens helps him to focus on  his swallowing and advises him to "eat slow" and take "small" bites/sips.  She is a great advocate for him and clearly they are compensating well for pt's chronic dysphagia. Pt takes medications with applesauce and reports this to helpful.      SLP educated pt to findings, dysphagia mitigation strategies, and need to maintain strong voice/cough/"hock" for maximal airway protection.   Provided information in writing and using teach back.    No SLP follow up indicated at this time, however if dysphagia worsens, pt may benefit from OP MBS to allow instrumental evaluation.   SLP Visit Diagnosis: Dysphagia, oropharyngeal phase (R13.12)    Aspiration Risk  Mild aspiration risk;Moderate aspiration risk    Diet Recommendation Dysphagia 3 (Mech soft);Thin liquid   Liquid Administration via: Straw;Cup Medication Administration: Whole meds with puree Supervision: Patient able to self feed Compensations: Slow rate;Small sips/bites (drink water during meals)    Other  Recommendations Oral Care Recommendations: Oral care BID   Follow up Recommendations None      Frequency and Duration   n/a         Prognosis   n/a     Swallow Study   General Date of Onset: 06/25/16 HPI: pt is a 76 yo male adm to [facility] with pyelonephritis.  Pt's reports cough x several weeks - denies reflux issues.  PMH + for Parkinson's disease- diagnosed 2012, COPD- prior smoker, neurogenic bladder- self caths, recurrent UTI, HTN.  Pt with autonomic bp changes - reportedly when  pressure is high pt neck stiffens and he drools. Recent CXRs have been negative.  Swallow evaluation ordered. Spouse in room with pt.  Type of Study: Bedside Swallow Evaluation Diet Prior to this Study: Regular;Thin liquids Temperature Spikes Noted: No Respiratory Status: Room air History of Recent Intubation: No Behavior/Cognition: Alert;Cooperative;Pleasant mood Oral Cavity - Dentition: Edentulous (pt reports he has ill fitting dentures) Vision:  Functional for self-feeding Self-Feeding Abilities: Able to feed self Patient Positioning: Upright in bed Baseline Vocal Quality: Normal Volitional Cough: Strong Volitional Swallow: Able to elicit    Oral/Motor/Sensory Function Overall Oral Motor/Sensory Function: Generalized oral weakness (lingual protrusion noted )   Ice Chips Ice chips: Not tested   Thin Liquid Thin Liquid: Impaired Presentation: Self Fed;Straw Oral Phase Impairments: Reduced lingual movement/coordination;Reduced labial seal (lingual thrusting noted) Pharyngeal  Phase Impairments: Throat Clearing - Delayed Other Comments: mild delayed throat clearing - but also throat clearing at baseline since he has had a "cold" per spouse/pt    Nectar Thick Nectar Thick Liquid: Not tested   Honey Thick Honey Thick Liquid: Not tested   Puree Puree: Not tested   Solid   GO   Solid: Impaired Presentation: Self Fed Oral Phase Impairments: Reduced labial seal;Reduced lingual movement/coordination (lingual thrusting) Pharyngeal Phase Impairments: Cough - Immediate Other Comments: with 4th bite of viscous pancake, pt presented with post-swallow cough - advised to avoid food items that cause him to cough; pt and spouse admit some foods cause him to cough        Mills Koller, MS [facility] SLP [phone]

## 2016-06-25 NOTE — Evaluation (Signed)
Physical Therapy Evaluation Patient Details Name: Bryan Vega MRN: [id number] DOB: [birth year] Today's Date: 06/25/2016   History of Present Illness  76 y/o male admitted with hypotension and weakness.  PMH includes Parkinson's and neurogenic bladder  Clinical Impression  Pt admitted with above diagnosis. Pt currently with functional limitations due to the deficits listed below (see PT Problem List).  Pt will benefit from skilled PT to increase their independence and safety with mobility to allow discharge to the venue listed below.  Pt able to ambulate in room only today with RW and MIN A. Wife reports he normally ambulates better and she provides S with gait at home.  Recommend HHPT.       Follow Up Recommendations Home health PT;Supervision for mobility/OOB    Equipment Recommendations  None recommended by PT    Recommendations for Other Services       Precautions / Restrictions Precautions Precautions: Fall Restrictions Weight Bearing Restrictions: No      Mobility  Bed Mobility Overal bed mobility: Needs Assistance Bed Mobility: Supine to Sit     Supine to sit: Min assist;+2 for physical assistance     General bed mobility comments: Pt able to assist some with legs, but needs A to complete transfer. MIN A to get scooted to EOB to get feet on the floor.  Transfers Overall transfer level: Needs assistance Equipment used: Rolling walker (2 wheeled) Transfers: Sit to/from Stand Sit to Stand: Min assist;+2 safety/equipment         General transfer comment: cues for hand placement.  Static standing while NT cleaned pt with wash cloth.  Ambulation/Gait Ambulation/Gait assistance: Min assist Ambulation Distance (Feet): 10 Feet Assistive device: Rolling walker (2 wheeled) Gait Pattern/deviations: Decreased step length - right;Decreased step length - left Gait velocity: decreased Gait velocity interpretation: Below normal speed for age/gender General Gait Details:  Decreased step length with slow gait pattern.  MIN A to help steer/progress RW  Stairs            Wheelchair Mobility    Modified Rankin (Stroke Patients Only)       Balance Overall balance assessment: Needs assistance Sitting-balance support: Feet supported;Bilateral upper extremity supported Sitting balance-Leahy Scale: Fair Sitting balance - Comments: Improved balance once feet were on ground. close min/guard                                     Pertinent Vitals/Pain Pain Assessment: No/denies pain    Home Living Family/patient expects to be discharged to:: Private residence Living Arrangements: Spouse/significant other Available Help at Discharge: Family;Available 24 hours/day Type of Home: House Home Access: Stairs to enter   [employer] of Steps: 1 Home Layout: One level Home Equipment: Walker - 2 wheels;Shower seat;Grab bars - tub/shower;Bedside commode      Prior Function Level of Independence: Needs assistance   Gait / Transfers Assistance Needed: Wife A with bed mobility prior to shower, S to MIN A with transfer and ambulation           Hand Dominance        Extremity/Trunk Assessment   Upper Extremity Assessment Upper Extremity Assessment: Generalized weakness    Lower Extremity Assessment Lower Extremity Assessment: Generalized weakness;RLE deficits/detail;LLE deficits/detail RLE Coordination: decreased gross motor LLE Coordination: decreased gross motor       Communication   Communication: Expressive difficulties  Cognition Arousal/Alertness: Awake/alert Behavior During Therapy: WFL for tasks  assessed/performed;Flat affect Overall Cognitive Status: Within Functional Limits for tasks assessed                                 General Comments: wife answers most questions      General Comments      Exercises     Assessment/Plan    PT Assessment Patient needs continued PT services  PT  Problem List Decreased strength;Decreased activity tolerance;Decreased balance;Decreased mobility;Decreased coordination       PT Treatment Interventions DME instruction;Gait training;Functional mobility training;Therapeutic activities;Therapeutic exercise;Balance training;Patient/family education    PT Goals (Current goals can be found in the Care Plan section)  Acute Rehab PT Goals Patient Stated Goal: to go home with therapy PT Goal Formulation: With patient/family Time For Goal Achievement: 07/09/16 Potential to Achieve Goals: Good    Frequency Min 3X/week   Barriers to discharge        Co-evaluation               End of Session Equipment Utilized During Treatment: Gait belt Activity Tolerance: Patient tolerated treatment well Patient left: in chair;with chair alarm set;with family/visitor present Nurse Communication: Mobility status PT Visit Diagnosis: Other abnormalities of gait and mobility (R26.89);Difficulty in walking, not elsewhere classified (R26.2)    Time: 1610-9604 PT Time Calculation (min) (ACUTE ONLY): 27 min   Charges:   PT Evaluation $PT Eval Moderate Complexity: 1 Procedure PT Treatments $Gait Training: 8-22 mins   PT G Codes:        Bryan Vega, Bryan Vega Pager [phone] 06/25/2016   Bryan Vega 06/25/2016, 2:37 PM

## 2016-06-26 DIAGNOSIS — R600 Localized edema: Secondary | ICD-10-CM

## 2016-06-26 DIAGNOSIS — I10 Essential (primary) hypertension: Secondary | ICD-10-CM

## 2016-06-26 LAB — CBC WITH DIFFERENTIAL/PLATELET
BASOS ABS: 0 10*3/uL (ref 0.0–0.1)
BASOS PCT: 0 %
Eosinophils Absolute: 0.6 10*3/uL (ref 0.0–0.7)
Eosinophils Relative: 8 %
HEMATOCRIT: 29.1 % — AB (ref 39.0–52.0)
HEMOGLOBIN: 9.9 g/dL — AB (ref 13.0–17.0)
LYMPHS PCT: 26 %
Lymphs Abs: 2 10*3/uL (ref 0.7–4.0)
MCH: 29.9 pg (ref 26.0–34.0)
MCHC: 34 g/dL (ref 30.0–36.0)
MCV: 87.9 fL (ref 78.0–100.0)
MONO ABS: 0.6 10*3/uL (ref 0.1–1.0)
MONOS PCT: 7 %
NEUTROS PCT: 59 %
Neutro Abs: 4.6 10*3/uL (ref 1.7–7.7)
PLATELETS: 169 10*3/uL (ref 150–400)
RBC: 3.31 MIL/uL — ABNORMAL LOW (ref 4.22–5.81)
RDW: 14.1 % (ref 11.5–15.5)
WBC: 7.8 10*3/uL (ref 4.0–10.5)

## 2016-06-26 LAB — URINE CULTURE: Culture: >=100000 — AB

## 2016-06-26 LAB — BASIC METABOLIC PANEL
ANION GAP: 9 (ref 5–15)
BUN: 16 mg/dL (ref 6–20)
CALCIUM: 9.2 mg/dL (ref 8.9–10.3)
CO2: 28 mmol/L (ref 22–32)
Chloride: 99 mmol/L — ABNORMAL LOW (ref 101–111)
Creatinine, Ser: 1.04 mg/dL (ref 0.61–1.24)
GFR calc non Af Amer: >60 mL/min (ref >60)
GLUCOSE: 95 mg/dL (ref 65–99)
POTASSIUM: 4.6 mmol/L (ref 3.5–5.1)
Sodium: 136 mmol/L (ref 135–145)

## 2016-06-26 MED ORDER — HYDRALAZINE HCL 50 MG PO TABS: 50.0000 mg | ORAL_TABLET | Freq: Three times a day (TID) | ORAL | 0 refills | Status: DC

## 2016-06-26 MED ORDER — CEPHALEXIN 500 MG PO CAPS: 500.0000 mg | ORAL_CAPSULE | Freq: Two times a day (BID) | ORAL | 0 refills | Status: AC

## 2016-06-26 MED ORDER — CITALOPRAM HYDROBROMIDE 20 MG PO TABS: 10.0000 mg | ORAL_TABLET | Freq: Every day | ORAL | Status: AC

## 2016-06-26 MED ORDER — CEPHALEXIN 500 MG PO CAPS: 500.0000 mg | ORAL_CAPSULE | Freq: Two times a day (BID) | ORAL | Status: DC

## 2016-06-26 NOTE — Discharge Summary (Signed)
Physician Discharge Summary  Bryan Vega  ZOX:[id number]  DOB: July 03, 1940  DOA: 06/24/2016 PCP: No PCP Per Patient  Admit date: 06/24/2016 Discharge date: 06/26/2016  Admitted From: Home Disposition: Home  Recommendations for Outpatient Follow-up:  1. Follow up with PCP in 1-2 weeks 2. Please obtain BMP/CBC in one week 3. Please follow up on the following pending results: Final blood cultures NGTD x 2 days   Discharge Condition: Stable  CODE STATUS: FULL  Diet recommendation: Heart Healthy   Brief/Interim Summary: 76 year old male with past medical history of Parkinson, COPD neurogenic bladder, self catheterize twice a day (multiple UTIs including ESBL in the past) presented to the emergency department with complaint of weakness and lethargy. Patient was found to have hyponatremia and UTI. Admitted for IV antibiotic and hydration. Urine culture growth E coli pansensitive. Patient initially treated with Rocephin then transitioned to Keflex BID x 5 days. Patient subsequently improved and back to baseline. Patient tolerating diet and ambulating well.   Off note patient had lower extremity pitting edema approximately for the past 1-2 weeks he has been taking Lasix for it. Echo done 2017 show grade 1 diastolic dysfunction with normal EF. Patient was started on Lasix IV which he responded very well, with almost complete resolution of the edema.   Subjective: Patient seen and examined, no acute events overnight. Doing well, wants to go home. Patient remains afebrile, WBC back to normal. Good UOP.   Discharge Diagnoses/Hospital Course:  Sepsis secondary to UTI - urine culture growing Escherichia coli more than 100,000 colonies pansensitive, sepsis physiology resolved.  Initially treated with Rocephin - transitioned to Keflex which will continue for 5 days  Follow up final blood cultures Follow up with ID Dr. Daiva Eves  Patient with neurogenic bladder for which he in and out cath twice a day.    Hyponatremia - clinically undetermined - ? 2/2 to Celexa  Improved with IVF and salt tablets   Defer to PCP if further workup deemed necessary  Encourage hydration and salt intake  Celexa dose reduced to 10 mg  Check BMP in 1 week  Leg swelling - unclear etiology - ECHO has no changes from 2017, Improving with diuresis  BNP 155, ECHO with no changes from 2017 - Normal EF with G1DD Initially treated with IV lasix 20mg  BID - transitioned to 20mg  daily, good UOP Continue Lasix 20 mg PO daily - until evaluated by PCP   HTN  Started on Hydralazine 50 mg TID - BP holding well  Follow up with PCP   Depression  Celexa dose decrease in half due to hyponatremia  Monitor   All other chronic medical condition were stable during the hospitalization.  Patient was seen by physical therapy, recommending HH PT but patient refusing  On the day of the discharge the patient's vitals were stable, and no other acute medical condition were reported by patient. Patient was felt safe to be discharge to home   Discharge Instructions  You were cared for by a hospitalist during your hospital stay. If you have any questions about your discharge medications or the care you received while you were in the hospital after you are discharged, you can call the unit and asked to speak with the hospitalist on call if the hospitalist that took care of you is not available. Once you are discharged, your primary care physician will handle any further medical issues. Please note that NO REFILLS for any discharge medications will be authorized once you are discharged, as  it is imperative that you return to your primary care physician (or establish a relationship with a primary care physician if you do not have one) for your aftercare needs so that they can reassess your need for medications and monitor your lab values.  Discharge Instructions    Call MD for:  difficulty breathing, headache or visual disturbances    Complete  by:  As directed    Call MD for:  extreme fatigue    Complete by:  As directed    Call MD for:  hives    Complete by:  As directed    Call MD for:  persistant dizziness or light-headedness    Complete by:  As directed    Call MD for:  persistant nausea and vomiting    Complete by:  As directed    Call MD for:  redness, tenderness, or signs of infection (pain, swelling, redness, odor or green/yellow discharge around incision site)    Complete by:  As directed    Call MD for:  severe uncontrolled pain    Complete by:  As directed    Call MD for:  temperature >100.4    Complete by:  As directed    Diet - low sodium heart healthy    Complete by:  As directed    Increase activity slowly    Complete by:  As directed      Allergies as of 06/26/2016   No Known Allergies     Medication List    STOP taking these medications   fosfomycin 3 g Pack Commonly known as:  MONUROL     TAKE these medications   acetaminophen 500 MG tablet Commonly known as:  TYLENOL Take 1,000 mg by mouth every 6 (six) hours as needed for fever.   aspirin EC 81 MG tablet Take 81 mg by mouth daily.   atorvastatin 20 MG tablet Commonly known as:  LIPITOR Take 20 mg by mouth at bedtime.   CALCIUM PO Take 1 tablet by mouth daily.   carbidopa-levodopa 25-100 MG tablet Commonly known as:  SINEMET IR Take 2 tablets by mouth 3 (three) times daily.   cephALEXin 500 MG capsule Commonly known as:  KEFLEX Take 1 capsule (500 mg total) by mouth 2 (two) times daily.   citalopram 20 MG tablet Commonly known as:  CELEXA Take 0.5 tablets (10 mg total) by mouth at bedtime. What changed:  how much to take   docusate sodium 100 MG capsule Commonly known as:  COLACE Take 100 mg by mouth daily.   ferrous sulfate 325 (65 FE) MG tablet Take 325 mg by mouth daily with breakfast.   FISH OIL PO Take 1 capsule by mouth daily.   furosemide 20 MG tablet Commonly known as:  LASIX Take 20 mg by mouth daily.    gabapentin 100 MG capsule Commonly known as:  NEURONTIN Take 200 mg by mouth daily after supper.   hydrALAZINE 50 MG tablet Commonly known as:  APRESOLINE Take 1 tablet (50 mg total) by mouth 3 (three) times daily.   levothyroxine 88 MCG tablet Commonly known as:  SYNTHROID, LEVOTHROID Take 88 mcg by mouth daily before breakfast.   MULTIVITAMIN GUMMIES ADULT PO Take 1 tablet by mouth daily.   rOPINIRole 0.5 MG tablet Commonly known as:  REQUIP Take 1-1.5 mg by mouth 2 (two) times daily. Take  in the evening and 1.5mg s at bedtime   tamsulosin 0.4 MG Caps capsule Commonly known as:  FLOMAX Take 0.4  mg by mouth at bedtime.            Durable Medical Equipment        Start     Ordered   06/26/16 1146  For home use only DME Hospital bed  Once    Question Answer Comment  The above medical condition requires: Patient requires the ability to reposition frequently   Bed type Semi-electric      06/26/16 1146      No Known Allergies  Consultations:  None    Procedures/Studies: Dg Chest Port 1 View  Result Date: 06/24/2016 CLINICAL DATA:  Shortness of breath and hypotension. EXAM: PORTABLE CHEST 1 VIEW COMPARISON:  Single-view of the chest 01/20/2016. PA and lateral chest 12/20/2015. FINDINGS: The lungs are clear. Heart size is normal. No pneumothorax or pleural effusion. No acute bony abnormality. IMPRESSION: No acute disease. Electronically Signed   By: Drusilla Kanner M.D.   On: 06/24/2016 14:52   ECHO  ------------------------------------------------------------------- Study Conclusions  - Left ventricle: The cavity size was normal. Systolic function was   normal. The estimated ejection fraction was in the range of 60%   to 65%. Wall motion was normal; there were no regional wall   motion abnormalities. Doppler parameters are consistent with   abnormal left ventricular relaxation (grade 1 diastolic   dysfunction). Doppler parameters are consistent with  elevated   ventricular end-diastolic filling pressure. - Aortic valve: Trileaflet; normal thickness leaflets. There was no   regurgitation. - Mitral valve: There was mild regurgitation. - Left atrium: The atrium was mildly dilated. - Right ventricle: Systolic function was normal. - Right atrium: The atrium was normal in size. - Tricuspid valve: There was mild regurgitation. - Pulmonary arteries: Systolic pressure was within the normal   range. - Inferior vena cava: The vessel was normal in size. - Pericardium, extracardiac: There was no pericardial effusion  Discharge Exam: Vitals:   06/25/16 2052 06/26/16 0538  BP: 122/61 (!) 153/70  Pulse: 72 73  Resp: 16 16  Temp: 98 F (36.7 C) 97.9 F (36.6 C)   Vitals:   06/25/16 1300 06/25/16 2052 06/26/16 0500 06/26/16 0538  BP: 107/79 122/61  (!) 153/70  Pulse: 68 72  73  Resp: Temp: 98.1 F (36.7 C) 98 F (36.7 C)  97.9 F (36.6 C)  TempSrc: Oral Oral  Oral  SpO2: 99% 100%  100%  Weight:   77.1 kg (169 lb 15.6 oz)   Height:        General: Pt is alert, awake, not in acute distress Cardiovascular: RRR, S1/S2 +, no rubs, no gallops Respiratory: CTA bilaterally, no wheezing, no rhonchi Abdominal: Soft, NT, ND, bowel sounds + Extremities: trace LE edema, no cyanosis   The results of significant diagnostics from this hospitalization (including imaging, microbiology, ancillary and laboratory) are listed below for reference.     Microbiology: Recent Results (from the past 240 hour(s))  Blood Culture (routine x 2)     Status: None (Preliminary result)   Collection Time: 06/24/16  2:14 PM  Result Value Ref Range Status   Specimen Description BLOOD RIGHT HAND  Final   Special Requests   Final    Blood Culture results may not be optimal due to an inadequate volume of blood received in culture bottles   Culture   Final    NO GROWTH < 24 HOURS Performed at [facility], [address] [place], [place]  [address]  Report Status PENDING  Incomplete  Urine culture     Status: Abnormal   Collection Time: 06/24/16  2:14 PM  Result Value Ref Range Status   Specimen Description URINE, RANDOM  Final   Special Requests NONE  Final   Culture >=100,000 COLONIES/mL ESCHERICHIA COLI (A)  Final   Report Status 06/26/2016 FINAL  Final   Organism ID, Bacteria ESCHERICHIA COLI (A)  Final      Susceptibility   Escherichia coli - MIC*    AMPICILLIN 4 SENSITIVE Sensitive     CEFAZOLIN <=4 SENSITIVE Sensitive     CEFTRIAXONE <=1 SENSITIVE Sensitive     CIPROFLOXACIN <=0.25 SENSITIVE Sensitive     GENTAMICIN <=1 SENSITIVE Sensitive     IMIPENEM <=0.25 SENSITIVE Sensitive     NITROFURANTOIN <=16 SENSITIVE Sensitive     TRIMETH/SULFA <=20 SENSITIVE Sensitive     AMPICILLIN/SULBACTAM 4 SENSITIVE Sensitive     PIP/TAZO <=4 SENSITIVE Sensitive     Extended ESBL NEGATIVE Sensitive     * >=100,000 COLONIES/mL ESCHERICHIA COLI  Blood Culture (routine x 2)     Status: None (Preliminary result)   Collection Time: 06/24/16  2:19 PM  Result Value Ref Range Status   Specimen Description BLOOD LEFT ANTECUBITAL  Final   Special Requests Blood Culture adequate volume  Final   Culture   Final    NO GROWTH < 24 HOURS Performed at [facility], [address] [place], [place] [address]    Report Status PENDING  Incomplete     Labs: BNP (last 3 results)  Recent Labs  06/24/16 1426 06/24/16 2037  BNP 104.2* 155.1*   Basic Metabolic Panel:  Recent Labs Lab 06/24/16 1426 06/25/16 0536 06/26/16 0501  NA 124* 132* 136  K 4.3 3.4* 4.6  CL 89* 97* 99*  CO2 GLUCOSE 93 117* 95  BUN 21* 20 16  CREATININE 1.21 1.09 1.04  CALCIUM 9.0 8.8* 9.2   Liver Function Tests:  Recent Labs Lab 06/24/16 1426 06/25/16 0536  AST 34 26  ALT 5* 7*  ALKPHOS 56 52  BILITOT 0.8 0.4  PROT 6.5 5.8*  ALBUMIN 3.6 3.1*   No results for input(s): LIPASE, AMYLASE in the last 168 hours. No results for  input(s): AMMONIA in the last 168 hours. CBC:  Recent Labs Lab 06/24/16 1426 06/25/16 0536 06/26/16 0501  WBC 12.5* 8.5 7.8  NEUTROABS 9.9*  --  4.6  HGB 9.4* 9.2* 9.9*  HCT 27.4* 26.3* 29.1*  MCV 83.8 85.9 87.9  PLT 169 147* 169   Cardiac Enzymes: No results for input(s): CKTOTAL, CKMB, CKMBINDEX, TROPONINI in the last 168 hours. BNP: Invalid input(s): POCBNP CBG: No results for input(s): GLUCAP in the last 168 hours. D-Dimer No results for input(s): DDIMER in the last 72 hours. Hgb A1c No results for input(s): HGBA1C in the last 72 hours. Lipid Profile No results for input(s): CHOL, HDL, LDLCALC, TRIG, CHOLHDL, LDLDIRECT in the last 72 hours. Thyroid function studies No results for input(s): TSH, T4TOTAL, T3FREE, THYROIDAB in the last 72 hours.  Invalid input(s): FREET3 Anemia work up No results for input(s): VITAMINB12, FOLATE, FERRITIN, TIBC, IRON, RETICCTPCT in the last 72 hours. Urinalysis    Component Value Date/Time   COLORURINE YELLOW 06/24/2016 1414   APPEARANCEUR CLOUDY (A) 06/24/2016 1414   LABSPEC 1.005 06/24/2016 1414   PHURINE 6.0 06/24/2016 1414   GLUCOSEU 50 (A) 06/24/2016 1414   HGBUR SMALL (A) 06/24/2016 1414   BILIRUBINUR  NEGATIVE 06/24/2016 1414   KETONESUR NEGATIVE 06/24/2016 1414   PROTEINUR NEGATIVE 06/24/2016 1414   NITRITE POSITIVE (A) 06/24/2016 1414   LEUKOCYTESUR LARGE (A) 06/24/2016 1414   Sepsis Labs Invalid input(s): PROCALCITONIN,  WBC,  LACTICIDVEN Microbiology Recent Results (from the past 240 hour(s))  Blood Culture (routine x 2)     Status: None (Preliminary result)   Collection Time: 06/24/16  2:14 PM  Result Value Ref Range Status   Specimen Description BLOOD RIGHT HAND  Final   Special Requests   Final    Blood Culture results may not be optimal due to an inadequate volume of blood received in culture bottles   Culture   Final    NO GROWTH < 24 HOURS Performed at [facility], [address] [place],  [place] [address]    Report Status PENDING  Incomplete  Urine culture     Status: Abnormal   Collection Time: 06/24/16  2:14 PM  Result Value Ref Range Status   Specimen Description URINE, RANDOM  Final   Special Requests NONE  Final   Culture >=100,000 COLONIES/mL ESCHERICHIA COLI (A)  Final   Report Status 06/26/2016 FINAL  Final   Organism ID, Bacteria ESCHERICHIA COLI (A)  Final      Susceptibility   Escherichia coli - MIC*    AMPICILLIN 4 SENSITIVE Sensitive     CEFAZOLIN <=4 SENSITIVE Sensitive     CEFTRIAXONE <=1 SENSITIVE Sensitive     CIPROFLOXACIN <=0.25 SENSITIVE Sensitive     GENTAMICIN <=1 SENSITIVE Sensitive     IMIPENEM <=0.25 SENSITIVE Sensitive     NITROFURANTOIN <=16 SENSITIVE Sensitive     TRIMETH/SULFA <=20 SENSITIVE Sensitive     AMPICILLIN/SULBACTAM 4 SENSITIVE Sensitive     PIP/TAZO <=4 SENSITIVE Sensitive     Extended ESBL NEGATIVE Sensitive     * >=100,000 COLONIES/mL ESCHERICHIA COLI  Blood Culture (routine x 2)     Status: None (Preliminary result)   Collection Time: 06/24/16  2:19 PM  Result Value Ref Range Status   Specimen Description BLOOD LEFT ANTECUBITAL  Final   Special Requests Blood Culture adequate volume  Final   Culture   Final    NO GROWTH < 24 HOURS Performed at [facility], [address], [place], [place] [address]    Report Status PENDING  Incomplete    Time coordinating discharge: 40 minutes  SIGNED:  Latrelle Dodrill, MD  [facility] 06/26/2016, 12:06 PM  Pager please text page via  [email or web address] Password [email or web address]

## 2016-06-26 NOTE — Progress Notes (Signed)
Completed D/C teaching. Gave prescriptions. Discussed medications. Patient will be D/C home with family in stable condition. 

## 2016-06-26 NOTE — Progress Notes (Signed)
Pt changed and asked for [facility] with [facility]. A call was made to [facility], rep for [employer] [phone]).  Referral was given.

## 2016-06-26 NOTE — Progress Notes (Signed)
Spoke with pt and wife at bedside concerning HHPT and needs.  Pt did not want HHPT.  Wife asked about condom cathter.  Information given to both that condom cathter can purchased from medical supply or pharmacy store.

## 2016-06-27 NOTE — Progress Notes (Signed)
Pt's wife called to cancel Hospital bed for pt. [facility] rep states this is the second time pt has cancelled a Hospital.

## 2016-06-29 LAB — CULTURE, BLOOD (ROUTINE X 2)
CULTURE: NO GROWTH
Culture: NO GROWTH
Special Requests: ADEQUATE

## 2016-07-15 NOTE — Progress Notes (Signed)
Bryan Vega was seen today in the movement disorders clinic for neurologic consultation at the request of [name], PA-C.    This patient is accompanied in the office by his son and wife who supplements the history. The consultation is for the evaluation of PD.  He was previously seen by Dr. Hyacinth Meeker at [facility].  The records that were made available to me were reviewed.  He was last seen by Dr. Hyacinth Meeker on 06/04/16.   Pt dx with PD in approximately 2011/2012 and his first sx was R hand tremor.  He was started on carbidopa/levodopa 25/100 at the time of dx and has never been on any other medication for the diagnosis.  He is currently on carbidopa/levodopa 25/100, 2 po tid (6am/12pm/5pm).  Pt isn't sure that med has ever been helpful but son thinks that it has.    His PD meds have been adjusted (up and down) over time to accomodate [facility].  Just recently, PCP added labetolol and hydralazine because BP max were in 180's.  Not long thereafter (06/24/16) admitted to [facility] for labile BP.  He was also hyponatremic during that stay and celexa dose decreased as that was felt possible contributor to etiology.  He followed back up with his treating physician assistant and had high blood pressures and then norvasc added on 07/11/16.  Specific Symptoms:  Tremor: Yes.  , very little in the R arm Family hx of similar:  No. Voice: yes, speech and tone have changed (in more recent years) - doing speech therapy now at home Sleep: sleeping well  Vivid Dreams:  No.  Acting out dreams:  Yes.  , intermittently per wife over the last 1-2 years Wet Pillows: No. Postural symptoms:  Yes.  , uses walker x 1 year  Falls?  Yes.   (last fall 3-4 months ago, no hx of fx) Bradykinesia symptoms: slow movements and difficulty getting out of a chair Loss of smell:  No. Loss of taste:  No. Urinary Incontinence:  Yes.   (self cath during day and incontinent at night - self cath x 1 + year) Difficulty Swallowing:  Yes.    (some choking on water) - may have had swallow study in [place] 8 months ago ? Which hospital Handwriting, micrographia: Yes.   Trouble with ADL's:  Yes.   (wife helps to dress/put on shoes)  Trouble buttoning clothing: No. Depression:  Yes.   (on celexa - they are not sure if pt is depressed) Memory changes:  No. Hallucinations:  No.  visual distortions: No. N/V:  No. Lightheaded:  Yes.   (pt perceives high blood pressure a bigger issue than low blood pressure)  Syncope: Yes.   Diplopia:  No. Dyskinesia:  No. (pt denies)   PREVIOUS MEDICATIONS: Sinemet  ALLERGIES:  No Known Allergies  CURRENT MEDICATIONS:  Outpatient Encounter Prescriptions as of 07/17/2016  Medication Sig  . acetaminophen (TYLENOL) 500 MG tablet Take 1,000 mg by mouth every 6 (six) hours as needed for fever.  Marland Kitchen amLODipine (NORVASC) 5 MG tablet Take 5 mg by mouth daily.  Marland Kitchen aspirin EC 81 MG tablet Take 81 mg by mouth daily.  Marland Kitchen atorvastatin (LIPITOR) 20 MG tablet Take 20 mg by mouth at bedtime.   Marland Kitchen CALCIUM PO Take 1 tablet by mouth daily.  . carbidopa-levodopa (SINEMET IR) 25-100 MG tablet Take 2 tablets by mouth 3 (three) times daily.  . citalopram (CELEXA) 20 MG tablet Take 0.5 tablets (10 mg total) by  mouth at bedtime.  . docusate sodium (COLACE) 100 MG capsule Take 100 mg by mouth daily.   . ferrous sulfate 325 (65 FE) MG tablet Take 325 mg by mouth daily with breakfast.  . furosemide (LASIX) 20 MG tablet Take 20 mg by mouth daily.  Marland Kitchen gabapentin (NEURONTIN) 100 MG capsule Take 200 mg by mouth daily after supper.   . hydrALAZINE (APRESOLINE) 50 MG tablet Take 1 tablet (50 mg total) by mouth 3 (three) times daily.  Marland Kitchen levothyroxine (SYNTHROID, LEVOTHROID) 88 MCG tablet Take 88 mcg by mouth daily before breakfast.  . Multiple Vitamins-Minerals (MULTIVITAMIN GUMMIES ADULT PO) Take 1 tablet by mouth daily.  . Omega-3 Fatty Acids (FISH OIL PO) Take 1 capsule by mouth daily.  Marland Kitchen rOPINIRole (REQUIP) 0.5 MG tablet  Take by mouth. Take  in the evening and 1.5mg  at bedtime  . tamsulosin (FLOMAX) 0.4 MG CAPS capsule Take 0.4 mg by mouth at bedtime.    No facility-administered encounter medications on file as of 07/17/2016.     PAST MEDICAL HISTORY:   Past Medical History:  Diagnosis Date  . Bacteria in urine 07/16/2016  . COPD (chronic obstructive pulmonary disease) (HCC)   . Hyperlipidemia   . Hypertension   . Hypothyroidism   . Neurogenic bladder   . 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 Parkinson's disease (HCC)   . 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 APPENDECTOMY    . JOINT REPLACEMENT    . 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 Marital status: Married    Spouse name: N/A  . Number of children: N/A  . Years of education: N/A   Occupational History  . Not on file.   Social History Main Topics  . Smoking status: Former Smoker    Packs/day: 0.50    Years: 20.00    Types: Cigarettes    Quit date: 04/20/1996  . Smokeless tobacco: Never Used  . Alcohol use No  . Drug use: No  . Sexual activity: No   Other Topics Concern  . Not on file   Social History Narrative  . No narrative on file    FAMILY HISTORY:   Family Status  Relation Status  . Mother Deceased  . Father Deceased  . Brother Deceased  . Child Alive    ROS:  A complete 10 system review of systems was obtained and was unremarkable apart from what is mentioned above.  PHYSICAL EXAMINATION:    VITALS:   Vitals:   07/17/16 0824  Weight: 173 lb (78.5 kg)  Height:  (1.702 m)    GEN:  The patient appears stated age and is in NAD. HEENT:  Normocephalic, atraumatic.  The mucous membranes are moist. The superficial temporal arteries are without ropiness or tenderness. CV:  RRR Lungs:  CTAB Neck/HEME:  There are no carotid bruits bilaterally.  Neurological  examination:  Orientation:  Montreal Cognitive Assessment  07/17/2016  Visuospatial/ Executive (0/5) 2  Naming (0/3) 2  Attention: Read list of digits (0/2) 1  Attention: Read list of letters (0/1) 1  Attention: Serial 7 subtraction starting at 100 (0/3) 3  Language: Repeat phrase (0/2) 0  Language : Fluency (0/1) 0  Abstraction (0/2) 0  Delayed Recall (0/5) 0  Orientation (0/6) 6  Total 15  Adjusted Score (based on education) 15  Cranial nerves: There is good facial symmetry. Pupils are pinpoint and nonreactive.  Fundoscopic exam is attempted but the disc margins are not well visualized bilaterally.  Extraocular muscles are intact. The visual fields are full to confrontational testing. The speech is pseudobulbar in quality. Soft palate rises symmetrically and there is no tongue deviation. Hearing is intact to conversational tone. Sensation: Sensation is intact to light and pinprick throughout (facial, trunk, extremities). Vibration is intact at the bilateral big toe. There is no extinction with double simultaneous stimulation. There is no sensory dermatomal level identified. Motor: Strength is 5/5 in the bilateral upper and lower extremities.   Shoulder shrug is equal and symmetric.  There is no pronator drift. Deep tendon reflexes: Deep tendon reflexes are 2/4 at the bilateral biceps, triceps, brachioradialis, 1/4 at the bilateral patella and achilles. Plantar responses are downgoing bilaterally.  Movement examination: Tone: There is mild increased tone in the RUE.  Part of this is gegenhalten. Abnormal movements: pt has mild dyskinesia Coordination:  There is mild decremation with RAM's, seen with hand opening and closing, finger taps, alternation of supination but mostly with heel and toe taps Gait and Station: The patient has mild difficulty arising out of a deep-seated chair without the use of the hands and instead pushes off the WC. The patient's stride length is fairly normal but he  isn't stable.  He is much more stable with the walker  ASSESSMENT/PLAN:  1.  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  He looked good today but patient not sure that it is working.  On carbidopa/levodopa 25/100, 2 po tid.  Going to have him spread it out more evenly to 1 po 6 times per day to see if will help with BP issues and with feeling that the legs don't work well.  2.  Orthostatic hypotension  -Long discussion with the patient and family.  Explained that in patients who have Parkinson's disease, we generally accept higher blood pressures than we would in the average population to avoid the low blood pressures which can cause syncope and falls.  I think that perhaps his recent additions of hydralazine, labetolol, and Norvasc all within a few weeks are not going to serve him well in the long run and worry about near syncope.  Overall, I would not recommend treating his systolic blood pressures unless they are sustained above 180-200.  I did hear from the family that the patient doesn't feel well if BPs are over 180.  They certainly weren't today and report that it spikes every day at 10am.  We took it at 9:48am when he left and it was 104/60.  He was orthostatic today in the office.    3.  Continue ST and if able restart PT (stopped when BP was too high)  4.  Will try to get a copy of his MBE  5.  Follow up is anticipated in the next few months, sooner should new neurologic issues arise.  Much greater than 50% of this visit was spent in counseling and coordinating care.  Total face to face time:  60 min This did not include the 45 minutes that I spent in record review, which was non-face-to-face time.  CcDoreatha Martin, MD

## 2016-07-16 ENCOUNTER — Ambulatory Visit (INDEPENDENT_AMBULATORY_CARE_PROVIDER_SITE_OTHER): Payer: Medicare Other | Admitting: Infectious Disease

## 2016-07-16 ENCOUNTER — Encounter: Payer: Self-pay | Admitting: Infectious Disease

## 2016-07-16 VITALS — BP 189/74 | HR 73 | Temp 98.4°F

## 2016-07-16 DIAGNOSIS — G20 Parkinson's disease: Secondary | ICD-10-CM

## 2016-07-16 DIAGNOSIS — R531 Weakness: Secondary | ICD-10-CM | POA: Diagnosis not present

## 2016-07-16 DIAGNOSIS — N319 Neuromuscular dysfunction of bladder, unspecified: Secondary | ICD-10-CM

## 2016-07-16 DIAGNOSIS — I10 Essential (primary) hypertension: Secondary | ICD-10-CM

## 2016-07-16 DIAGNOSIS — R8271 Bacteriuria: Secondary | ICD-10-CM | POA: Diagnosis not present

## 2016-07-16 DIAGNOSIS — E871 Hypo-osmolality and hyponatremia: Secondary | ICD-10-CM

## 2016-07-16 HISTORY — DX: Bacteriuria: R82.71

## 2016-07-16 HISTORY — DX: Essential (primary) hypertension: I10

## 2016-07-16 NOTE — Progress Notes (Signed)
Chief complaint: followup for recurrent UTI's   Subjective:    Patient ID: Bryan Vega, male    DOB: 18-Aug-1940, 76 y.o.   MRN: [id number]  HPI  Mr. Bryan Vega is a 76 year old [place] man with multiple medical problems including parkinsonism with a neurogenic bladder requiring self-catheterization 2-3 times a day. 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   Because he does not urinate on his own his symptoms almost never include dysuria. Rather his urinary tract infections are typically more manifest by him having fevers and becoming more confused and lethargic.  He was recently admitted to the hospital in late October after a prior admission in September. In October 20 19th developed worsening weakness as well as a fever that was measured 102. He was admitted to hospitalist service blood and urine cultures were drawn and he was treated with IV ceftriaxone for 3 days. He had improvement in his symptoms but actually grew Pseudomonas aeruginosa from his urine which is obviously not sensitive to his ceftriaxone. He was discharged as home with ciprofloxacin orally despite the sensitivities not being back yet.  We had previously been consulted to see the patient as an outpatient to work him up for recurrent urinary tract infections and to advise on the management of these infections.  I was skeptical that many of these UTIS were actually UTI's . He saw PCP in late March and had urine analsyis and cultures done --THOUGH NOT CLEAR TO ME why as he had no symptoms, no fevers, suprapubic pain, dysuria.   He grew Proteus R to cefazolin but CTX S and E coli that was S to cefaz, CTX. He received 3 days of systemic CTX then given keflex. IN the interim he had been given labetalol and then lasix for  LE edema. He was found to be hyponatremic and with labile BP becomign quite hypotensive with standing. He was admitted due to these problems. He was allegedly confused though he and family deny this.    He again had urine culture done despite symptoms and this grew a pan sensitive E coli for which he was treated.   HIs hyponatremia was corrected and he is feeling much better.        Past Medical History:  Diagnosis Date  . Bacteria in urine 07/16/2016  . COPD (chronic obstructive pulmonary disease) (HCC)   . Hyperlipidemia   . Hypertension   . Hypothyroidism   . Neurogenic bladder   . 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 Parkinson's disease (HCC)   . 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 APPENDECTOMY    . JOINT REPLACEMENT    . 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 Family history unknown: Yes      Social History   Social History  . Marital status: Married    Spouse name: N/A  . Number of children: N/A  . Years of education: N/A   Social History Main Topics  . Smoking status: Former Smoker    Packs/day: 0.50    Years: 20.00    Types: Cigarettes    Quit date: 04/20/1996  . Smokeless tobacco: Never Used  . Alcohol use Yes  . Drug use: No  . Sexual activity: No  Other Topics Concern  . None   Social History Narrative  . None    No Known Allergies   Current Outpatient Prescriptions:  .  acetaminophen (TYLENOL) 500 MG tablet, Take 1,000 mg by mouth every 6 (six) hours as needed for fever., Disp: , Rfl:  .  aspirin EC 81 MG tablet, Take 81 mg by mouth daily., Disp: , Rfl:  .  atorvastatin (LIPITOR) 20 MG tablet, Take 20 mg by mouth at bedtime. , Disp: , Rfl:  .  CALCIUM PO, Take 1 tablet by mouth daily., Disp: , Rfl:  .  carbidopa-levodopa (SINEMET IR) 25-100 MG tablet, Take 2 tablets by mouth 3 (three) times daily., Disp: , Rfl:  .   citalopram (CELEXA) 20 MG tablet, Take 0.5 tablets (10 mg total) by mouth at bedtime., Disp: , Rfl:  .  docusate sodium (COLACE) 100 MG capsule, Take 100 mg by mouth daily. , Disp: , Rfl:  .  ferrous sulfate 325 (65 FE) MG tablet, Take 325 mg by mouth daily with breakfast., Disp: , Rfl:  .  furosemide (LASIX) 20 MG tablet, Take 20 mg by mouth daily., Disp: , Rfl:  .  gabapentin (NEURONTIN) 100 MG capsule, Take 200 mg by mouth daily after supper. , Disp: , Rfl:  .  hydrALAZINE (APRESOLINE) 50 MG tablet, Take 1 tablet (50 mg total) by mouth 3 (three) times daily., Disp: 90 tablet, Rfl: 0 .  levothyroxine (SYNTHROID, LEVOTHROID) 88 MCG tablet, Take 88 mcg by mouth daily before breakfast., Disp: , Rfl:  .  Multiple Vitamins-Minerals (MULTIVITAMIN GUMMIES ADULT PO), Take 1 tablet by mouth daily., Disp: , Rfl:  .  Omega-3 Fatty Acids (FISH OIL PO), Take 1 capsule by mouth daily., Disp: , Rfl:  .  rOPINIRole (REQUIP) 0.5 MG tablet, Take 1-1.5 mg by mouth 2 (two) times daily. Take  in the evening and 1.5mg s at bedtime, Disp: , Rfl:  .  tamsulosin (FLOMAX) 0.4 MG CAPS capsule, Take 0.4 mg by mouth at bedtime. , Disp: , Rfl:     Review of Systems  Unable to perform ROS: Dementia       Objective:   Physical Exam  Constitutional: He appears well-nourished. No distress.  HENT:  Head: Normocephalic.  Mouth/Throat: Oropharynx is clear and moist. No oropharyngeal exudate.  Eyes: Conjunctivae are normal.  Neck: Normal range of motion.  Cardiovascular: Normal rate.   Pulmonary/Chest: Effort normal. No respiratory distress.  Abdominal: Soft. Bowel sounds are normal. He exhibits no distension.  Neurological: He is alert.  Fairly significant rigidity of his muscles sitting in wheelchair  Skin: He is not diaphoretic.  Psychiatric: His speech is delayed. He is slowed. Cognition and memory are impaired.          Assessment & Plan:   Recurrent UTI: MANY of these are NOT UTI's. Need help from  family and patient in monitoring for symptoms that would suggest UTI, not checking urine because the patient is simply in the clinic or the ED. Even confusion should NOT ALONE be sufficient groud to check UA and culture Family and patient will need to teach restraint to providers re unnecessary UA and cultures.  Neurogenic bladder: see prior discusisons  I spent greater than 40  minutes with the patient including greater than 50% of time in face to face counsel of the patient re his recurrent bladder infections, his recent orthostatic problems his PD.

## 2016-07-17 ENCOUNTER — Ambulatory Visit (INDEPENDENT_AMBULATORY_CARE_PROVIDER_SITE_OTHER): Payer: Medicare Other | Admitting: Neurology

## 2016-07-17 ENCOUNTER — Encounter: Payer: Self-pay | Admitting: Neurology

## 2016-07-17 VITALS — Ht 67.0 in | Wt 173.0 lb

## 2016-07-17 DIAGNOSIS — G20 Parkinson's disease: Secondary | ICD-10-CM | POA: Diagnosis not present

## 2016-07-17 DIAGNOSIS — G903 Multi-system degeneration of the autonomic nervous system: Secondary | ICD-10-CM | POA: Diagnosis not present

## 2016-07-17 DIAGNOSIS — N39498 Other specified urinary incontinence: Secondary | ICD-10-CM | POA: Diagnosis not present

## 2016-07-17 NOTE — Patient Instructions (Addendum)
1. Change Carbidopa Levodopa 25/100 IR to 1 tablet 6 times daily.   2. Talk to primary provider about maybe tapering off one of your blood pressure medications.   3. Hold Carbidopa Levodopa the day of your next visit.

## 2016-07-23 ENCOUNTER — Telehealth: Payer: Self-pay | Admitting: Neurology

## 2016-07-23 NOTE — Telephone Encounter (Signed)
Caller: PT's daughter  Urgent? No  Reason for the call: Voicemail message left saying she had a question for Dr Tat or her nurse but did not say what that question was

## 2016-07-23 NOTE — Telephone Encounter (Signed)
Spoke with patient's daughter. She states he is doing better with his blood pressures since changing dosage timing. He is still having some symptoms he was having before though. She states he will have "episodes" that last a couple of hours 1-2 times daily (usually around 10 am and 3 pm, which isn't corelated to when he is due for medication) where he develops headaches and a "pulling" sensation in his neck. At this time his drooling will get worse.  Please advise.

## 2016-07-24 NOTE — Telephone Encounter (Signed)
Patient's daughter made aware.  She states during those times his blood pressure is always normal 120's / 70-80's.  They will try the extra 1/2 Levodopa and see if that helps.

## 2016-07-24 NOTE — Telephone Encounter (Signed)
Have him take extra 1/2 levodopa at these times and see if helps.  Not sure it will.  Also, check BP at these times.  Are we sure that BP isn't too low???  It sure was pretty low in the office.

## 2016-07-30 ENCOUNTER — Telehealth: Payer: Self-pay | Admitting: Neurology

## 2016-07-30 NOTE — Telephone Encounter (Signed)
[name] called from home health company [facility]. They would like orders. Please call back at [phone]. Thanks

## 2016-07-30 NOTE — Telephone Encounter (Signed)
Spoke with [name] and gave verbal orders to continue PT (since BP better) and ST.

## 2016-08-08 ENCOUNTER — Inpatient Hospital Stay (HOSPITAL_COMMUNITY)
Admission: EM | Admit: 2016-08-08 | Discharge: 2016-08-11 | DRG: 872 | Disposition: A | Discharge status: Home Health | Payer: Medicare Other | Attending: Internal Medicine | Admitting: Internal Medicine

## 2016-08-08 ENCOUNTER — Emergency Department (HOSPITAL_COMMUNITY): Payer: Medicare Other

## 2016-08-08 ENCOUNTER — Encounter (HOSPITAL_COMMUNITY): Payer: Self-pay | Admitting: *Deleted

## 2016-08-08 DIAGNOSIS — N390 Urinary tract infection, site not specified: Secondary | ICD-10-CM

## 2016-08-08 DIAGNOSIS — I10 Essential (primary) hypertension: Secondary | ICD-10-CM

## 2016-08-08 DIAGNOSIS — G20A1 Parkinson's disease without dyskinesia, without mention of fluctuations: Secondary | ICD-10-CM | POA: Diagnosis present

## 2016-08-08 DIAGNOSIS — N39490 Overflow incontinence: Secondary | ICD-10-CM | POA: Diagnosis present

## 2016-08-08 DIAGNOSIS — A419 Sepsis, unspecified organism: Principal | ICD-10-CM | POA: Diagnosis present

## 2016-08-08 DIAGNOSIS — J449 Chronic obstructive pulmonary disease, unspecified: Secondary | ICD-10-CM | POA: Diagnosis present

## 2016-08-08 DIAGNOSIS — F39 Unspecified mood [affective] disorder: Secondary | ICD-10-CM | POA: Diagnosis present

## 2016-08-08 DIAGNOSIS — E039 Hypothyroidism, unspecified: Secondary | ICD-10-CM | POA: Diagnosis present

## 2016-08-08 DIAGNOSIS — G909 Disorder of the autonomic nervous system, unspecified: Secondary | ICD-10-CM | POA: Diagnosis not present

## 2016-08-08 DIAGNOSIS — E785 Hyperlipidemia, unspecified: Secondary | ICD-10-CM | POA: Diagnosis present

## 2016-08-08 DIAGNOSIS — B961 Klebsiella pneumoniae [K. pneumoniae] as the cause of diseases classified elsewhere: Secondary | ICD-10-CM | POA: Diagnosis present

## 2016-08-08 DIAGNOSIS — R4182 Altered mental status, unspecified: Secondary | ICD-10-CM

## 2016-08-08 DIAGNOSIS — B9620 Unspecified Escherichia coli [E. coli] as the cause of diseases classified elsewhere: Secondary | ICD-10-CM | POA: Diagnosis present

## 2016-08-08 DIAGNOSIS — R531 Weakness: Secondary | ICD-10-CM

## 2016-08-08 DIAGNOSIS — B9689 Other specified bacterial agents as the cause of diseases classified elsewhere: Secondary | ICD-10-CM | POA: Diagnosis present

## 2016-08-08 DIAGNOSIS — N319 Neuromuscular dysfunction of bladder, unspecified: Secondary | ICD-10-CM | POA: Diagnosis not present

## 2016-08-08 DIAGNOSIS — Z7982 Long term (current) use of aspirin: Secondary | ICD-10-CM

## 2016-08-08 DIAGNOSIS — E871 Hypo-osmolality and hyponatremia: Secondary | ICD-10-CM

## 2016-08-08 DIAGNOSIS — G20 Parkinson's disease: Secondary | ICD-10-CM | POA: Diagnosis present

## 2016-08-08 DIAGNOSIS — Z96653 Presence of artificial knee joint, bilateral: Secondary | ICD-10-CM | POA: Diagnosis present

## 2016-08-08 DIAGNOSIS — Z87891 Personal history of nicotine dependence: Secondary | ICD-10-CM

## 2016-08-08 DIAGNOSIS — I951 Orthostatic hypotension: Secondary | ICD-10-CM | POA: Diagnosis present

## 2016-08-08 DIAGNOSIS — Z79899 Other long term (current) drug therapy: Secondary | ICD-10-CM

## 2016-08-08 LAB — URINALYSIS, ROUTINE W REFLEX MICROSCOPIC
Bilirubin Urine: NEGATIVE
GLUCOSE, UA: 150 mg/dL — AB
Hgb urine dipstick: NEGATIVE
KETONES UR: NEGATIVE mg/dL
Nitrite: NEGATIVE
PH: 6 (ref 5.0–8.0)
Protein, ur: NEGATIVE mg/dL
SPECIFIC GRAVITY, URINE: 1.01 (ref 1.005–1.030)

## 2016-08-08 LAB — COMPREHENSIVE METABOLIC PANEL
ALK PHOS: 61 U/L (ref 38–126)
AST: 21 U/L (ref 15–41)
Albumin: 4.2 g/dL (ref 3.5–5.0)
Anion gap: 5 (ref 5–15)
BUN: 30 mg/dL — AB (ref 6–20)
CALCIUM: 9.1 mg/dL (ref 8.9–10.3)
CO2: 26 mmol/L (ref 22–32)
CREATININE: 1.11 mg/dL (ref 0.61–1.24)
Chloride: 99 mmol/L — ABNORMAL LOW (ref 101–111)
GFR calc Af Amer: >60 mL/min (ref >60)
Glucose, Bld: 105 mg/dL — ABNORMAL HIGH (ref 65–99)
Potassium: 4.5 mmol/L (ref 3.5–5.1)
Sodium: 130 mmol/L — ABNORMAL LOW (ref 135–145)
Total Bilirubin: 0.4 mg/dL (ref 0.3–1.2)
Total Protein: 6.8 g/dL (ref 6.5–8.1)

## 2016-08-08 LAB — CBC
HCT: 31.9 % — ABNORMAL LOW (ref 39.0–52.0)
Hemoglobin: 10.5 g/dL — ABNORMAL LOW (ref 13.0–17.0)
MCH: 29.5 pg (ref 26.0–34.0)
MCHC: 32.9 g/dL (ref 30.0–36.0)
MCV: 89.6 fL (ref 78.0–100.0)
PLATELETS: 157 10*3/uL (ref 150–400)
RBC: 3.56 MIL/uL — AB (ref 4.22–5.81)
RDW: 13.4 % (ref 11.5–15.5)
WBC: 8.4 10*3/uL (ref 4.0–10.5)

## 2016-08-08 LAB — I-STAT CG4 LACTIC ACID, ED: Lactic Acid, Venous: 0.79 mmol/L (ref 0.5–1.9)

## 2016-08-08 LAB — CBG MONITORING, ED: GLUCOSE-CAPILLARY: 102 mg/dL — AB (ref 65–99)

## 2016-08-08 MED ORDER — ACETAMINOPHEN 650 MG RE SUPP: 650.0000 mg | Freq: Four times a day (QID) | RECTAL | Status: DC | PRN

## 2016-08-08 MED ORDER — CARBIDOPA-LEVODOPA 25-100 MG PO TABS
1.0000 | ORAL_TABLET | Freq: Every day | ORAL | Status: DC
  Administered 2016-08-08 – 2016-08-11 (×15): 1 via ORAL
  Filled 2016-08-08 (×15): qty 1

## 2016-08-08 MED ORDER — GABAPENTIN 100 MG PO CAPS
200.0000 mg | ORAL_CAPSULE | Freq: Every day | ORAL | Status: DC
  Administered 2016-08-09 – 2016-08-10 (×2): 200 mg via ORAL
  Filled 2016-08-08 (×2): qty 2

## 2016-08-08 MED ORDER — LABETALOL HCL 5 MG/ML IV SOLN
10.0000 mg | Freq: Once | INTRAVENOUS | Status: AC
  Administered 2016-08-08: 10 mg via INTRAVENOUS
  Filled 2016-08-08: qty 4

## 2016-08-08 MED ORDER — DIAZEPAM 2 MG PO TABS
2.0000 mg | ORAL_TABLET | Freq: Once | ORAL | Status: AC
  Administered 2016-08-08: 2 mg via ORAL
  Filled 2016-08-08: qty 1

## 2016-08-08 MED ORDER — SODIUM CHLORIDE 0.9% FLUSH
3.0000 mL | Freq: Two times a day (BID) | INTRAVENOUS | Status: DC
  Administered 2016-08-08 – 2016-08-10 (×4): 3 mL via INTRAVENOUS

## 2016-08-08 MED ORDER — ONDANSETRON HCL 4 MG/2ML IJ SOLN: 4.0000 mg | Freq: Four times a day (QID) | INTRAMUSCULAR | Status: DC | PRN

## 2016-08-08 MED ORDER — ASPIRIN EC 81 MG PO TBEC
81.0000 mg | DELAYED_RELEASE_TABLET | Freq: Every day | ORAL | Status: DC
  Administered 2016-08-09 – 2016-08-11 (×3): 81 mg via ORAL
  Filled 2016-08-08 (×3): qty 1

## 2016-08-08 MED ORDER — CITALOPRAM HYDROBROMIDE 10 MG PO TABS
10.0000 mg | ORAL_TABLET | Freq: Every day | ORAL | Status: DC
  Administered 2016-08-08 – 2016-08-10 (×3): 10 mg via ORAL
  Filled 2016-08-08 (×3): qty 1

## 2016-08-08 MED ORDER — FERROUS SULFATE 325 (65 FE) MG PO TABS
325.0000 mg | ORAL_TABLET | Freq: Every day | ORAL | Status: DC
  Administered 2016-08-09 – 2016-08-11 (×3): 325 mg via ORAL
  Filled 2016-08-08 (×3): qty 1

## 2016-08-08 MED ORDER — DOCUSATE SODIUM 100 MG PO CAPS
100.0000 mg | ORAL_CAPSULE | Freq: Every day | ORAL | Status: DC
  Administered 2016-08-08 – 2016-08-11 (×4): 100 mg via ORAL
  Filled 2016-08-08 (×4): qty 1

## 2016-08-08 MED ORDER — LABETALOL HCL 5 MG/ML IV SOLN
5.0000 mg | INTRAVENOUS | Status: DC | PRN
  Filled 2016-08-08: qty 4

## 2016-08-08 MED ORDER — DEXTROSE 5 % IV SOLN: 1.0000 g | Freq: Once | INTRAVENOUS | Status: DC

## 2016-08-08 MED ORDER — LEVOTHYROXINE SODIUM 88 MCG PO TABS
88.0000 ug | ORAL_TABLET | Freq: Every day | ORAL | Status: DC
  Administered 2016-08-09 – 2016-08-11 (×3): 88 ug via ORAL
  Filled 2016-08-08 (×3): qty 1

## 2016-08-08 MED ORDER — ROPINIROLE HCL 1 MG PO TABS
2.0000 mg | ORAL_TABLET | Freq: Every day | ORAL | Status: DC
Start: 2016-08-08 — End: 2016-08-11
  Administered 2016-08-08 – 2016-08-10 (×3): 2 mg via ORAL
  Filled 2016-08-08 (×3): qty 2

## 2016-08-08 MED ORDER — DEXTROSE 5 % IV SOLN
1.0000 g | Freq: Once | INTRAVENOUS | Status: AC
  Administered 2016-08-08: 1 g via INTRAVENOUS
  Filled 2016-08-08: qty 1

## 2016-08-08 MED ORDER — ATORVASTATIN CALCIUM 20 MG PO TABS
20.0000 mg | ORAL_TABLET | Freq: Every day | ORAL | Status: DC
  Administered 2016-08-08 – 2016-08-10 (×3): 20 mg via ORAL
  Filled 2016-08-08 (×2): qty 2
  Filled 2016-08-08 (×3): qty 1

## 2016-08-08 MED ORDER — DEXTROSE 5 % IV SOLN
1.0000 g | Freq: Three times a day (TID) | INTRAVENOUS | Status: DC
  Administered 2016-08-09 – 2016-08-10 (×4): 1 g via INTRAVENOUS
  Filled 2016-08-08 (×6): qty 1

## 2016-08-08 MED ORDER — TAMSULOSIN HCL 0.4 MG PO CAPS
0.4000 mg | ORAL_CAPSULE | Freq: Every day | ORAL | Status: DC
  Administered 2016-08-08 – 2016-08-10 (×3): 0.4 mg via ORAL
  Filled 2016-08-08 (×3): qty 1

## 2016-08-08 MED ORDER — CARBIDOPA-LEVODOPA 25-100 MG PO TABS
1.0000 | ORAL_TABLET | Freq: Once | ORAL | Status: AC
  Administered 2016-08-08: 1 via ORAL
  Filled 2016-08-08: qty 1

## 2016-08-08 MED ORDER — ONDANSETRON HCL 4 MG PO TABS: 4.0000 mg | ORAL_TABLET | Freq: Four times a day (QID) | ORAL | Status: DC | PRN

## 2016-08-08 MED ORDER — AZITHROMYCIN 250 MG PO TABS
500.0000 mg | ORAL_TABLET | Freq: Once | ORAL | Status: DC
  Filled 2016-08-08: qty 2

## 2016-08-08 MED ORDER — ACETAMINOPHEN 325 MG PO TABS
650.0000 mg | ORAL_TABLET | Freq: Four times a day (QID) | ORAL | Status: DC | PRN
  Administered 2016-08-08 – 2016-08-11 (×6): 650 mg via ORAL
  Filled 2016-08-08 (×6): qty 2

## 2016-08-08 NOTE — Progress Notes (Signed)
Pharmacy Antibiotic Note  Bryan Vega is a 76 y.o. male admitted on 08/08/2016 with UTI.  Pharmacy has been consulted for cefepime dosing. Patient with h/o recurrent UTI including pseudomonas  Plan:  Cefepime 1gm IV q8h for pseudomonas coverage  Height: 5\' 8"  (172.7 cm) Weight: 175 lb (79.4 kg) IBW/kg (Calculated) : 68.4  Temp (24hrs), Avg:97.8 F (36.6 C), Min:97.8 F (36.6 C), Max:97.8 F (36.6 C)   Recent Labs Lab 08/08/16 1324 08/08/16 1337  WBC 8.4  --   CREATININE 1.11  --   LATICACIDVEN  --  0.79    Estimated Creatinine Clearance: 53.9 mL/min (by C-G formula based on SCr of 1.11 mg/dL).    No Known Allergies  Antimicrobials this admission: 5/18 cefepime >>  Dose adjustments this admission:  Microbiology results: 4/3 E. Coli: pan-sensitive  Thank you for allowing pharmacy to be a part of this patient's care.  Juliette Alcideustin Zeigler, PharmD, BCPS.   Pager: [phone] 08/08/2016 9:42 PM

## 2016-08-08 NOTE — ED Notes (Signed)
Changed whole bed, it was wet with urine.

## 2016-08-08 NOTE — ED Provider Notes (Signed)
5:24 PM Pt awaiting admission after Ct head. Patient also given blood pressure medication by previous team. Next  Blood pressure significant improved after labetalol. CT head showed no acute bleed or acute intracranial abnormality.  Hospitalist team was again called and they will met the patient for further management of altered mental status in the setting of urinary tract infection versus pneumonia. Patient admitted in stable condition.   Clinical Impression: 1. Altered mental status, unspecified altered mental status type   2. Urinary tract infection without hematuria, site unspecified   3. Hyponatremia   4. Essential hypertension     Disposition: Admit to Hospitalist service     Tegeler, Gwenyth Allegra, MD 08/09/16 [phone]

## 2016-08-08 NOTE — ED Provider Notes (Signed)
[facility]-EMERGENCY DEPT Provider Note   CSN: [id number] Arrival date & time: 08/08/16  1230     History   Chief Complaint Chief Complaint  Patient presents with  . Altered Mental Status  . Fever    HPI Bryan Vega is a 76 y.o. male.  76yo M w/ PMH including Parkinson's disease, hypertension, hyperlipidemia, COPD, recurrent UTI who presents with altered mental status and weakness. Family reports that he has had generalized weakness and fatigue for the past 3 days, which is similar to when he has a UTI. T max of 100 this morning. He has had decreased urine output.  Family also reports recent fluctuations in his blood pressure. When his blood pressure goes high his neck tenses up and becomes painful; this is usually an intermittent problem but has been more constant over the past 2 days. No cough, SOB, CP, abdominal, dysuria, vomiting, or diarrhea. Family notes that he almost fell yesterday but his wife caught him. He has had L hand pain since then.  LEVEL 5 CAVEAT DUE TO AMS   The history is provided by a relative.  Altered Mental Status    Fever      Past Medical History:  Diagnosis Date  . Bacteria in urine 07/16/2016  . COPD (chronic obstructive pulmonary disease) (HCC)   . Hyperlipidemia   . Hypertension   . Hypothyroidism   . Neurogenic bladder   . 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 Parkinson's disease (HCC)   . 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 Bacteria in urine 07/16/2016  . 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 Pyelonephritis, acute 06/24/2016  . 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 Hyponatremia 01/21/2016  . Anemia 01/21/2016  . Infection due to urethral catheter (HCC)   . Sepsis secondary to UTI (HCC) 01/20/2016  . Hyperlipidemia 01/20/2016  . Hypothyroidism 01/20/2016  . Hypotension 12/20/2015  . Lower urinary tract infectious disease 12/20/2015  . Generalized weakness 12/20/2015  . Parkinson disease (HCC)  12/20/2015  . Neurogenic bladder 12/20/2015    Past Surgical History:  Procedure Laterality Date  . APPENDECTOMY    . JOINT REPLACEMENT    . 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 Authorizing Provider  acetaminophen (TYLENOL) 500 MG tablet Take 1,000 mg by mouth every 6 (six) hours as needed for fever.   Yes [provider]  aspirin EC 81 MG tablet Take 81 mg by mouth daily.   Yes [provider]  atorvastatin (LIPITOR) 20 MG tablet Take 20 mg by mouth at bedtime.    Yes [provider]  CALCIUM PO Take 1 tablet by mouth daily.   Yes [provider]  carbidopa-levodopa (SINEMET IR) 25-100 MG tablet Take 1 tablet by mouth every 2 (two) hours.    Yes [provider]  citalopram (CELEXA) 20 MG tablet Take 0.5 tablets (10 mg total) by mouth at bedtime. 06/26/16  Yes Randel Pigg, Dorma Russell, MD  docusate sodium (COLACE) 100 MG capsule Take 100 mg by mouth daily.    Yes [provider]  ferrous sulfate 325 (65 FE) MG tablet Take 325 mg by mouth daily with breakfast.   Yes [provider]  gabapentin (NEURONTIN) 100 MG capsule Take 200 mg by mouth daily after supper.    Yes [provider]  levothyroxine (SYNTHROID, LEVOTHROID)  88 MCG tablet Take 88 mcg by mouth daily before breakfast.   Yes [provider]  Omega-3 Fatty Acids (FISH OIL PO) Take 1 capsule by mouth daily.   Yes [provider]  rOPINIRole (REQUIP) 0.5 MG tablet Take by mouth. Take 1mg  in the evening and 1.5mg  at bedtime   Yes [provider]  tamsulosin (FLOMAX) 0.4 MG CAPS capsule Take 0.4 mg by mouth at bedtime.    Yes [provider]  hydrALAZINE (APRESOLINE) 50 MG tablet Take 1 tablet (50 mg total) by mouth 3 (three) times daily. Patient not taking: Reported on 08/08/2016 06/26/16   Lenox Ponds, MD      Family History Family History  Problem Relation Age of Onset  . Heart attack Brother     Social History Social History  Substance Use Topics  . Smoking status: Former Smoker    Packs/day: 0.50    Years: 20.00    Types: Cigarettes    Quit date: 04/20/1996  . Smokeless tobacco: Never Used  . Alcohol use No     Allergies   Patient has no known allergies.   Review of Systems Review of Systems  Unable to perform ROS: Mental status change  Constitutional: Positive for fever.     Physical Exam Updated Vital Signs BP (!) 204/98   Pulse 71   Temp 97.8 F (36.6 C) (Oral)   Resp 12   Ht 5\' 8"  (1.727 m)   Wt 175 lb (79.4 kg)   SpO2 100%   BMI 26.61 kg/m   Physical Exam  Constitutional: He appears well-developed and well-nourished. No distress.  HENT:  Head: Normocephalic and atraumatic.  Moist mucous membranes  Eyes: Conjunctivae are normal. Pupils are equal, round, and reactive to light.  Neck:  Muscle spasm b/l cervical paraspinal muscles with increased tone  Cardiovascular: Normal rate, regular rhythm and normal heart sounds.   No murmur heard. Pulmonary/Chest: Effort normal and breath sounds normal.  Abdominal: Soft. Bowel sounds are normal. He exhibits no distension. There is no tenderness.  Musculoskeletal: He exhibits no edema.  Mild tenderness at base of L thumb without obvious deformity, limited ROM 2/2 pain  Neurological: He is alert.  Oriented to person, following basic commands  Skin: Skin is warm and dry.  Psychiatric: He has a normal mood and affect. Judgment normal.  Nursing note and vitals reviewed.    ED Treatments / Results  Labs (all labs ordered are listed, but only abnormal results are displayed) Labs Reviewed  COMPREHENSIVE METABOLIC PANEL - Abnormal; Notable for the following:       Result Value   Sodium 130 (*)    Chloride 99 (*)    Glucose, Bld 105 (*)    BUN 30 (*)    ALT <5 (*)    All other components within normal limits   CBC - Abnormal; Notable for the following:    RBC 3.56 (*)    Hemoglobin 10.5 (*)    HCT 31.9 (*)    All other components within normal limits  URINALYSIS, ROUTINE W REFLEX MICROSCOPIC - Abnormal; Notable for the following:    APPearance HAZY (*)    Glucose, UA 150 (*)    Leukocytes, UA LARGE (*)    Bacteria, UA MANY (*)    Squamous Epithelial / LPF 0-5 (*)    All other components within normal limits  CBG MONITORING, ED - Abnormal; Notable for the following:    Glucose-Capillary 102 (*)  All other components within normal limits  URINE CULTURE  I-STAT CG4 LACTIC ACID, ED    EKG  EKG Interpretation None       Radiology Dg Chest 2 View  Result Date: 08/08/2016 CLINICAL DATA:  Initial evaluation for acute fever. EXAM: CHEST  2 VIEW COMPARISON:  Prior radiograph from 06/24/2016. FINDINGS: Transverse heart size at the upper limits of normal, stable. Mediastinal silhouette within normal limits. Mild aortic atherosclerosis. Lungs normally inflated. Mild patchy opacity within the left perihilar region, which may reflect atelectasis and/ or small focal infiltrate. No other focal infiltrates. No pulmonary edema or pleural effusion. No pneumothorax. Azygos lobe noted. No acute osseus abnormality. Multilevel degenerative spurring noted within the visualized spine. IMPRESSION: 1. Small focus of patchy opacity within the left perihilar region, which may reflect atelectasis and/or small focal infiltrate. 2. Aortic atherosclerosis. Electronically Signed   By: Rise Mu M.D.   On: 08/08/2016 14:55   Dg Finger Thumb Left  Result Date: 08/08/2016 CLINICAL DATA:  Initial evaluation for acute left thumb pain. Hit door few days ago. EXAM: LEFT THUMB 2+V COMPARISON:  None available. FINDINGS: No acute fracture or dislocation. Osseous density adjacent to the proximal aspect of the left first proximal phalanx demonstrates smooth corticated margins, consistent with chronic finding. Radial  subluxation at the first [facility] joint, likely chronic and degenerative in nature. 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 No soft tissue abnormality. IMPRESSION: 1. No acute osseous abnormality about the left thumb. 2. 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 Radial subluxation at the first [facility] joint likely chronic and degenerative in nature. Electronically Signed   By: Rise Mu M.D.   On: 08/08/2016 14:58    Procedures Procedures (including critical care time)  Medications Ordered in ED Medications  ceFEPIme (MAXIPIME) 1 g in dextrose 5 % 50 mL IVPB (1 g Intravenous New Bag/Given 08/08/16 1657)  azithromycin (ZITHROMAX) tablet 500 mg (500 mg Oral Refused 08/08/16 1659)  labetalol (NORMODYNE,TRANDATE) injection 10 mg (not administered)  carbidopa-levodopa (SINEMET IR) 25-100 MG per tablet immediate release 1 tablet (not administered)  diazepam (VALIUM) tablet 2 mg (2 mg Oral Given 08/08/16 1500)     Initial Impression / Assessment and Plan / ED Course  I have reviewed the triage vital signs and the nursing notes.  Pertinent labs & imaging results that were available during my care of the patient were reviewed by me and considered in my medical decision making (see chart for details).    Pt w/ h/o frequent UTI p/w Generalized weakness and altered mental status which is similar to previous episodes of UTI. He was nontoxic on exam with normal vital signs. No abdominal tenderness. Obtained above labs which showed normal lactate, sodium 130 which is consistent with his history of hyponatremia, creatinine 1.11, normal WBC count. UA shows large leukocytes, large WBCs and bacteria. I did urine culture. I reviewed the patient's recent culture data showing Escherichia coli and previously pseudomonas. He also has history of ESBL infection. I reviewed the recent infectious disease note which cautions against blaming all non-specific  symptoms such as weakness on UTI as ID felt he may be overtreated. For this reason, I obtained chest x-ray which showed atelectasis versus infiltrate and left perihilar region. I ordered azithromycin but family later refused as they wanted to wait and see what a repeat chest x-ray tomorrow looks like because he does not have any respiratory symptoms. His blood pressure has steadily increased here consistent with  family's report of high blood pressure readings at home. I ordered IV labetalol. I suspect that some of this is related to the fact that his blood pressure medications were discontinued recently. We will obtain a head CT to rule out intracranial process. The patient will be admitted to the hospitalist service for further workup and treatment.  Final Clinical Impressions(s) / ED Diagnoses   Final diagnoses:  None    New Prescriptions New Prescriptions   No medications on file     Little, Ambrose Finlandachel Morgan, MD 08/08/16 1727

## 2016-08-08 NOTE — ED Triage Notes (Signed)
Pt states altered mental status for 3 days.  Fever.  Decreased urination.  No Pain with urination.  Neck hurts.

## 2016-08-08 NOTE — H&P (Signed)
History and Physical    Bryan Vega BJY:[id number] DOB: January 21, 1941 DOA: 08/08/2016  PCP: Cheron Schaumann., MD  ID: Daiva Eves  Patient coming from: Home  Chief Complaint: Fever and weakness  HPI: Bryan Vega is a 76 y.o. gentleman with a history of Parkinson's disease (he is now on Sinemet six times daily and can take 1/2 tablet twice daily as needed based on his symptoms), autonomic dysfunction with orthostatic hypertension (attributed to Parkinson's disease per family), neurogenic bladder (also attributed to Parkinson's disease; he requires in and out cath 2-3 times daily), HTN, HLD, COPD, and hypothyroidism who presents to the ED for evaluation of low grade fever (Tmax 100 at home), increased weakness, decreased urine output, and mental status changes x three days.  Family reports that his symptoms are consistent with prior presentations for UTI.  No nausea, vomiting, abdominal pain, or diarrhea.  He has had some dizziness but no syncope.  No headache.  He has intermittent neck pain and increased saliva production that is attributed to his Parkinson's disease.  No falls or head trauma.  No chest pain or shortness of breath.  Of note, he had a recent left thumb injury that has contributed to decreased grip strength in his left hand.  ED Course: Normal lactic acid.  Normal WBC count.  BUN 30.  Creatinine 1.1 (family reports that he has received a liter of fluid in the ED but I do not see the order).  U/A shows large leukocytes, TNTC WBC, and many bacteria.  Chest xray concerning for left perihilar infiltrate (vs atelectasis).  Family has refused azithromycin for now; requesting follow-up chest xray at some point. Head CT negative for acute process.  Left thumb xray negative for acute fracture.  Urine culture pending.  The patient has received IV labetalol for accelerated HTN (systolic blood pressure greater than 200).  He has also received IV cefepime for probable UTI.  He has also received  oral valium and a dose of sinemet. Hospitalist asked to admit.  Review of Systems: As per HPI otherwise 10 systems reviewed negative.  Past Medical History:  Diagnosis Date  . Bacteria in urine 07/16/2016  . COPD (chronic obstructive pulmonary disease) (HCC)   . Hyperlipidemia   . Hypertension   . Hypothyroidism   . Neurogenic bladder   . 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 Parkinson's disease (HCC)   . 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 APPENDECTOMY    . JOINT REPLACEMENT    . REPLACEMENT TOTAL KNEE BILATERAL Bilateral 2007 & 2012     reports that he quit smoking about 20 years ago. His smoking use included Cigarettes. He has a 10.00 pack-year smoking history. He has never used smokeless tobacco. He reports that he does not drink alcohol or use drugs.  He is married.  He has three adult children.  No Known Allergies  Family History  Problem Relation Age of Onset  . Heart attack Brother      Prior to Admission medications   Medication Sig Start Date End Date Taking? Authorizing Provider  acetaminophen (TYLENOL) 500 MG tablet Take 1,000 mg by mouth every 6 (six) hours as needed for fever.   Yes [provider]  aspirin EC 81 MG tablet Take 81 mg by mouth daily.   Yes [provider]  atorvastatin (LIPITOR) 20 MG tablet Take 20 mg by mouth at bedtime.    Yes [provider]  CALCIUM PO Take  1 tablet by mouth daily.   Yes [provider]  carbidopa-levodopa (SINEMET IR) 25-100 MG tablet Take 1 tablet by mouth every 2 (two) hours.    Yes [provider]  citalopram (CELEXA) 20 MG tablet Take 0.5 tablets (10 mg total) by mouth at bedtime. 06/26/16  Yes Randel Pigg, Dorma Russell, MD  docusate sodium (COLACE) 100 MG capsule Take 100 mg by mouth daily.    Yes [provider]  ferrous sulfate 325 (65 FE) MG tablet Take 325 mg by mouth daily with breakfast.   Yes [provider]    gabapentin (NEURONTIN) 100 MG capsule Take 200 mg by mouth daily after supper.    Yes [provider]  levothyroxine (SYNTHROID, LEVOTHROID) 88 MCG tablet Take 88 mcg by mouth daily before breakfast.   Yes [provider]  Omega-3 Fatty Acids (FISH OIL PO) Take 1 capsule by mouth daily.   Yes [provider]  rOPINIRole (REQUIP) 0.5 MG tablet Take by mouth. Take 1mg  in the evening and 1.5mg  at bedtime   Yes [provider]  tamsulosin (FLOMAX) 0.4 MG CAPS capsule Take 0.4 mg by mouth at bedtime.    Yes [provider]    Physical Exam: Vitals:   08/08/16 1800 08/08/16 1900 08/08/16 1930 08/08/16 2000  BP: (!) 174/83 (!) 100/51 133/66 (!) 157/81  Pulse: 77 83 72 72  Resp: (!) 21 (!) 28 (!) 22 (!) 25  Temp:      TempSrc:      SpO2: 100% 100% 100% 100%  Weight:      Height:          Constitutional: NAD, calm, comfortable Vitals:   08/08/16 1800 08/08/16 1900 08/08/16 1930 08/08/16 2000  BP: (!) 174/83 (!) 100/51 133/66 (!) 157/81  Pulse: 77 83 72 72  Resp: (!) 21 (!) 28 (!) 22 (!) 25  Temp:      TempSrc:      SpO2: 100% 100% 100% 100%  Weight:      Height:       Eyes: PERRL, lids and conjunctivae normal ENMT: Mucous membranes are very dry. Posterior pharynx not completely visualized. Normal dentition.  Neck: normal appearance, supple, no masses, no meningismus Respiratory: clear to auscultation bilaterally, no wheezing, no crackles. Normal respiratory effort. No accessory muscle use.  Cardiovascular: Normal rate, regular rhythm, no murmurs / rubs / gallops. No extremity edema. 2+ pedal pulses. No carotid bruits.  GI: abdomen is soft and compressible.  No distention.  No tenderness.  Bowel sounds are present. Musculoskeletal:  No joint deformity in upper and lower extremities. Good ROM, no contractures. Normal muscle tone.  Skin: no rashes, warm and dry Neurologic: CN 2-12 grossly intact. Sensation intact, Strength decreased in left  hand. Psychiatric: Normal judgment and insight. Alert and oriented x 3. Normal mood.     Labs on Admission: I have personally reviewed following labs and imaging studies  CBC:  Recent Labs Lab 08/08/16 1324  WBC 8.4  HGB 10.5*  HCT 31.9*  MCV 89.6  PLT 157   Basic Metabolic Panel:  Recent Labs Lab 08/08/16 1324  NA 130*  K 4.5  CL 99*  CO2 26  GLUCOSE 105*  BUN 30*  CREATININE 1.11  CALCIUM 9.1   GFR: Estimated Creatinine Clearance: 53.9 mL/min (by C-G formula based on SCr of 1.11 mg/dL). Liver Function Tests:  Recent Labs Lab 08/08/16 1324  AST 21  ALT <5*  ALKPHOS 61  BILITOT 0.4  PROT 6.8  ALBUMIN 4.2   CBG:  Recent Labs Lab 08/08/16 1324  GLUCAP 102*   Urine analysis:    Component Value Date/Time   COLORURINE YELLOW 08/08/2016 1519   APPEARANCEUR HAZY (A) 08/08/2016 1519   LABSPEC 1.010 08/08/2016 1519   PHURINE 6.0 08/08/2016 1519   GLUCOSEU 150 (A) 08/08/2016 1519   HGBUR NEGATIVE 08/08/2016 1519   BILIRUBINUR NEGATIVE 08/08/2016 1519   KETONESUR NEGATIVE 08/08/2016 1519   PROTEINUR NEGATIVE 08/08/2016 1519   NITRITE NEGATIVE 08/08/2016 1519   LEUKOCYTESUR LARGE (A) 08/08/2016 1519   Sepsis Labs:  Lactic acid level 0.79  Radiological Exams on Admission: Dg Chest 2 View  Result Date: 08/08/2016 CLINICAL DATA:  Initial evaluation for acute fever. EXAM: CHEST  2 VIEW COMPARISON:  Prior radiograph from 06/24/2016. FINDINGS: Transverse heart size at the upper limits of normal, stable. Mediastinal silhouette within normal limits. Mild aortic atherosclerosis. Lungs normally inflated. Mild patchy opacity within the left perihilar region, which may reflect atelectasis and/ or small focal infiltrate. No other focal infiltrates. No pulmonary edema or pleural effusion. No pneumothorax. Azygos lobe noted. No acute osseus abnormality. Multilevel degenerative spurring noted within the visualized spine. IMPRESSION: 1. Small focus of patchy opacity  within the left perihilar region, which may reflect atelectasis and/or small focal infiltrate. 2. Aortic atherosclerosis. Electronically Signed   By: Rise Mu M.D.   On: 08/08/2016 14:55   Ct Head Wo Contrast  Result Date: 08/08/2016 CLINICAL DATA:  Altered mental status with frequent UTI EXAM: CT HEAD WITHOUT CONTRAST TECHNIQUE: Contiguous axial images were obtained from the base of the skull through the vertex without intravenous contrast. COMPARISON:  None. FINDINGS: Brain: No evidence of acute infarction, hemorrhage, hydrocephalus, extra-axial collection or mass lesion/mass effect. Mild to moderate atrophy. Vascular: No hyperdense vessels. Scattered calcifications at the carotid siphons. Skull: Normal. Negative for fracture or focal lesion. Sinuses/Orbits: Mucosal thickening in the ethmoid sinuses. No acute orbital abnormality. Right lens extraction. Other: None IMPRESSION: No CT evidence for acute intracranial abnormality. Electronically Signed   By: Jasmine Pang M.D.   On: 08/08/2016 17:33   Dg Finger Thumb Left  Result Date: 08/08/2016 CLINICAL DATA:  Initial evaluation for acute left thumb pain. Hit door few days ago. EXAM: LEFT THUMB 2+V COMPARISON:  None available. FINDINGS: No acute fracture or dislocation. Osseous density adjacent to the proximal aspect of the left first proximal phalanx demonstrates smooth corticated margins, consistent with chronic finding. Radial subluxation at the first [facility] joint, likely chronic and degenerative in nature. 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 No soft tissue abnormality. IMPRESSION: 1. No acute osseous abnormality about the left thumb. 2. 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 Radial subluxation at the first [facility] joint likely chronic and degenerative in nature. Electronically Signed   By: Rise Mu M.D.   On: 08/08/2016 14:58     Assessment/Plan Principal  Problem:   Sepsis secondary to UTI [facility]) Active Problems:   Parkinson disease (HCC)   Neurogenic bladder   Recurrent UTI   Acute lower UTI   Accelerated hypertension   Autonomic dysfunction      Early sepsis secondary to recurrent lower UTI.  Last admission 4/3-4/5 for the same.  He grew pan-sensitive E Coli at that time but he has a history of ESBL.  Followed by Dr. Daiva Eves of ID. --Continue IV cefepime for now --Blood cultures added; urine culture pending --Check procalcitonin level --In and out cath q6h prn --  Family declined azithromycin in the ED.  Consider repeat chest xray at some point  History of Parkinson's disease.  Ambulates with a walker at baseline.  New left grip weakness attributed to acute thumb injury. --Continue sinemet six times daily for now --Consider neurology consult if deficits persist despite treatment of UTI --Consider PT/OT in the AM --Of note, also on neurontin and requip at baseline  Neurogenic bladder --In and out cath q6h prn --Flomax  Autonomic dysfunction, accelerated HTN --Labetalol 5mg  IV q4h prn for systolic blood pressure greater than 170  HLD --Statin  Hypothyroidism --Continue home dose of levothyroxine  Mood disorder --Celexa        DVT prophylaxis: SCDs Code Status: FULL Family Communication: Wife at bedside, spoke to daughter who is an NP in [place] by phone. Disposition Plan: Expect he will go home when ready for discharge. Consults called: NONE Admission status: Place in observation with telemetry monitoring.   TIME SPENT: 60 minutes   Jerene Bearsarter,Treanna Dumler Harrison MD [facility] Pager [phone]  If 7PM-7AM, please contact night-coverage [email or web address] Password [email or web address]  08/08/2016, 9:34 PM

## 2016-08-08 NOTE — ED Notes (Signed)
Pt is alert and oriented x 4 and is verbally responsive needs occasional cueing with questions. Pt son and wife is at bedside and reports that pt has increased weakness requiring assistance from seated  to standing position, and while ambulating with walker stand by assistance from family in which pt baseline normally requires no assistance. Pt son states that he has had these symptoms prior when he has an infection, and that the pt is proned to UTI.

## 2016-08-08 NOTE — ED Notes (Signed)
Pt has decreased hand grip to left side. Family states that pt had hit is hand on the door a few days ago and it has been that way since.

## 2016-08-09 DIAGNOSIS — N319 Neuromuscular dysfunction of bladder, unspecified: Secondary | ICD-10-CM | POA: Diagnosis present

## 2016-08-09 DIAGNOSIS — E039 Hypothyroidism, unspecified: Secondary | ICD-10-CM | POA: Diagnosis present

## 2016-08-09 DIAGNOSIS — G909 Disorder of the autonomic nervous system, unspecified: Secondary | ICD-10-CM | POA: Diagnosis present

## 2016-08-09 DIAGNOSIS — I10 Essential (primary) hypertension: Secondary | ICD-10-CM | POA: Diagnosis present

## 2016-08-09 DIAGNOSIS — N39490 Overflow incontinence: Secondary | ICD-10-CM | POA: Diagnosis present

## 2016-08-09 DIAGNOSIS — I951 Orthostatic hypotension: Secondary | ICD-10-CM | POA: Diagnosis present

## 2016-08-09 DIAGNOSIS — B961 Klebsiella pneumoniae [K. pneumoniae] as the cause of diseases classified elsewhere: Secondary | ICD-10-CM | POA: Diagnosis present

## 2016-08-09 DIAGNOSIS — N390 Urinary tract infection, site not specified: Secondary | ICD-10-CM | POA: Diagnosis present

## 2016-08-09 DIAGNOSIS — Z87891 Personal history of nicotine dependence: Secondary | ICD-10-CM | POA: Diagnosis not present

## 2016-08-09 DIAGNOSIS — B9689 Other specified bacterial agents as the cause of diseases classified elsewhere: Secondary | ICD-10-CM | POA: Diagnosis present

## 2016-08-09 DIAGNOSIS — G20 Parkinson's disease: Secondary | ICD-10-CM | POA: Diagnosis present

## 2016-08-09 DIAGNOSIS — E785 Hyperlipidemia, unspecified: Secondary | ICD-10-CM | POA: Diagnosis present

## 2016-08-09 DIAGNOSIS — Z7982 Long term (current) use of aspirin: Secondary | ICD-10-CM | POA: Diagnosis not present

## 2016-08-09 DIAGNOSIS — A419 Sepsis, unspecified organism: Secondary | ICD-10-CM | POA: Diagnosis present

## 2016-08-09 DIAGNOSIS — R4182 Altered mental status, unspecified: Secondary | ICD-10-CM | POA: Diagnosis not present

## 2016-08-09 DIAGNOSIS — B9620 Unspecified Escherichia coli [E. coli] as the cause of diseases classified elsewhere: Secondary | ICD-10-CM | POA: Diagnosis present

## 2016-08-09 DIAGNOSIS — E871 Hypo-osmolality and hyponatremia: Secondary | ICD-10-CM | POA: Diagnosis present

## 2016-08-09 DIAGNOSIS — Z79899 Other long term (current) drug therapy: Secondary | ICD-10-CM | POA: Diagnosis not present

## 2016-08-09 DIAGNOSIS — F39 Unspecified mood [affective] disorder: Secondary | ICD-10-CM | POA: Diagnosis present

## 2016-08-09 DIAGNOSIS — J449 Chronic obstructive pulmonary disease, unspecified: Secondary | ICD-10-CM | POA: Diagnosis present

## 2016-08-09 DIAGNOSIS — Z96653 Presence of artificial knee joint, bilateral: Secondary | ICD-10-CM | POA: Diagnosis present

## 2016-08-09 DIAGNOSIS — R531 Weakness: Secondary | ICD-10-CM | POA: Diagnosis present

## 2016-08-09 LAB — CBC
HCT: 28.5 % — ABNORMAL LOW (ref 39.0–52.0)
Hemoglobin: 9.7 g/dL — ABNORMAL LOW (ref 13.0–17.0)
MCH: 30.3 pg (ref 26.0–34.0)
MCHC: 34 g/dL (ref 30.0–36.0)
MCV: 89.1 fL (ref 78.0–100.0)
Platelets: 143 10*3/uL — ABNORMAL LOW (ref 150–400)
RBC: 3.2 MIL/uL — ABNORMAL LOW (ref 4.22–5.81)
RDW: 13.6 % (ref 11.5–15.5)
WBC: 6.8 10*3/uL (ref 4.0–10.5)

## 2016-08-09 LAB — BASIC METABOLIC PANEL
ANION GAP: 7 (ref 5–15)
BUN: 25 mg/dL — ABNORMAL HIGH (ref 6–20)
CALCIUM: 9 mg/dL (ref 8.9–10.3)
CO2: 26 mmol/L (ref 22–32)
CREATININE: 1.04 mg/dL (ref 0.61–1.24)
Chloride: 103 mmol/L (ref 101–111)
GFR calc non Af Amer: >60 mL/min (ref >60)
Glucose, Bld: 98 mg/dL (ref 65–99)
Potassium: 4.4 mmol/L (ref 3.5–5.1)
SODIUM: 136 mmol/L (ref 135–145)

## 2016-08-09 LAB — PROCALCITONIN

## 2016-08-09 MED ORDER — ENOXAPARIN SODIUM 40 MG/0.4ML ~~LOC~~ SOLN
40.0000 mg | SUBCUTANEOUS | Status: DC
  Administered 2016-08-09 – 2016-08-10 (×2): 40 mg via SUBCUTANEOUS
  Filled 2016-08-09 (×2): qty 0.4

## 2016-08-09 MED ORDER — SODIUM CHLORIDE 0.9 % IV BOLUS (SEPSIS)
500.0000 mL | Freq: Once | INTRAVENOUS | Status: AC
  Administered 2016-08-09: 250 mL via INTRAVENOUS

## 2016-08-09 NOTE — Progress Notes (Addendum)
PROGRESS NOTE    Alfred LevinsManilal Rachel   ZOX:[id number]  DOB: September 29, 1940  DOA: 08/08/2016 PCP: Cheron SchaumannVelazquez, Gretchen Y., MD   Brief Narrative:  Alfred LevinsManilal Bento is a 76 y.o. gentleman with a history of Parkinson's disease (he is now on Sinemet six times daily and can take 1/2 tablet twice daily as needed based on his symptoms), autonomic dysfunction with orthostatic hypertension (attributed to Parkinson's disease per family), neurogenic bladder (also attributed to Parkinson's disease; he requires in and out cath 2-3 times daily), HTN, HLD, COPD, and hypothyroidism who presents to the ED for evaluation of low grade fever (Tmax 100 at home), increased weakness, decreased urine output, and mental status changes x three days.  Family reports that his symptoms are consistent with prior presentations for UTI.    Subjective: No complaints other than pain in the right knee and left hand.  Assessment & Plan:   Principal Problem:   Sepsis secondary to UTI  - h/o neurogenic bladder and I and O caths with resultant frequent UTIs - last culture grew pansensitive e coli - follow culture results and cont Cefepime - CXR suggestive for possible pneumonia however the patient is asymptomatic- follow  Active Problems:   Parkinson disease  - cont Sinemet - PT/ OT eval - at baseline, his mobility is limited and he uses a walker    Neurogenic bladder - cont I and O caths TID for now     Autonomic dysfunction with orthostatic hypotension - per daughter, his BP is known to fluctuate from SBP of 170s down to 90s- will cont to follow closely  DVT prophylaxis: Lovenox Code Status: Full code Family Communication: daughter and wife Disposition Plan: follow on med/surg- home when stable Consultants:    Procedures:    Antimicrobials:  Anti-infectives    Start     Dose/Rate Route Frequency Ordered Stop   08/09/16 0200  ceFEPIme (MAXIPIME) 1 g in dextrose 5 % 50 mL IVPB     1 g 100 mL/hr over 30 Minutes  Intravenous Every 8 hours 08/08/16 2142     08/08/16 1700  ceFEPIme (MAXIPIME) 1 g in dextrose 5 % 50 mL IVPB     1 g 100 mL/hr over 30 Minutes Intravenous  Once 08/08/16 1617 08/08/16 1737   08/08/16 1630  azithromycin (ZITHROMAX) tablet 500 mg  Status:  Discontinued     500 mg Oral  Once 08/08/16 1624 08/08/16 2134   08/08/16 1615  cefTRIAXone (ROCEPHIN) 1 g in dextrose 5 % 50 mL IVPB  Status:  Discontinued     1 g 100 mL/hr over 30 Minutes Intravenous  Once 08/08/16 1613 08/08/16 1617       Objective: Vitals:   08/09/16 0800 08/09/16 0804 08/09/16 0957 08/09/16 1330  BP: (!) 99/44 (!) 102/41 (!) 118/53   Pulse: 75 79    Resp: (!) 24     Temp: 99.3 F (37.4 C)   98.3 F (36.8 C)  TempSrc: Oral   Oral  SpO2: 98%     Weight:      Height:        Intake/Output Summary (Last 24 hours) at 08/09/16 1356 Last data filed at 08/09/16 1058  Gross per 24 hour  Intake              250 ml  Output              451 ml  Net             -201  ml   Filed Weights   08/08/16 1326  Weight: 79.4 kg (175 lb)    Examination: General exam: Appears comfortable  HEENT: PERRLA, oral mucosa moist, no sclera icterus or thrush Respiratory system: Clear to auscultation. Respiratory effort normal. Cardiovascular system: S1 & S2 heard, RRR.  No murmurs  Gastrointestinal system: Abdomen soft, non-tender, nondistended. Normal bowel sound. No organomegaly Central nervous system: Alert and oriented. No focal neurological deficits. Extremities: No cyanosis, clubbing or edema Skin: No rashes or ulcers Psychiatry:  Mood & affect appropriate.     Data Reviewed: I have personally reviewed following labs and imaging studies  CBC:  Recent Labs Lab 08/08/16 1324 08/09/16 0620  WBC 8.4 6.8  HGB 10.5* 9.7*  HCT 31.9* 28.5*  MCV 89.6 89.1  PLT 157 143*   Basic Metabolic Panel:  Recent Labs Lab 08/08/16 1324 08/09/16 0620  NA 130* 136  K 4.5 4.4  CL 99* 103  CO2 26 26  GLUCOSE 105* 98    BUN 30* 25*  CREATININE 1.11 1.04  CALCIUM 9.1 9.0   GFR: Estimated Creatinine Clearance: 57.5 mL/min (by C-G formula based on SCr of 1.04 mg/dL). Liver Function Tests:  Recent Labs Lab 08/08/16 1324  AST 21  ALT <5*  ALKPHOS 61  BILITOT 0.4  PROT 6.8  ALBUMIN 4.2   No results for input(s): LIPASE, AMYLASE in the last 168 hours. No results for input(s): AMMONIA in the last 168 hours. Coagulation Profile: No results for input(s): INR, PROTIME in the last 168 hours. Cardiac Enzymes: No results for input(s): CKTOTAL, CKMB, CKMBINDEX, TROPONINI in the last 168 hours. BNP (last 3 results) No results for input(s): PROBNP in the last 8760 hours. HbA1C: No results for input(s): HGBA1C in the last 72 hours. CBG:  Recent Labs Lab 08/08/16 1324  GLUCAP 102*   Lipid Profile: No results for input(s): CHOL, HDL, LDLCALC, TRIG, CHOLHDL, LDLDIRECT in the last 72 hours. Thyroid Function Tests: No results for input(s): TSH, T4TOTAL, FREET4, T3FREE, THYROIDAB in the last 72 hours. Anemia Panel: No results for input(s): VITAMINB12, FOLATE, FERRITIN, TIBC, IRON, RETICCTPCT in the last 72 hours. Urine analysis:    Component Value Date/Time   COLORURINE YELLOW 08/08/2016 1519   APPEARANCEUR HAZY (A) 08/08/2016 1519   LABSPEC 1.010 08/08/2016 1519   PHURINE 6.0 08/08/2016 1519   GLUCOSEU 150 (A) 08/08/2016 1519   HGBUR NEGATIVE 08/08/2016 1519   BILIRUBINUR NEGATIVE 08/08/2016 1519   KETONESUR NEGATIVE 08/08/2016 1519   PROTEINUR NEGATIVE 08/08/2016 1519   NITRITE NEGATIVE 08/08/2016 1519   LEUKOCYTESUR LARGE (A) 08/08/2016 1519   Sepsis Labs: @LABRCNTIP (procalcitonin:4,lacticidven:4) )No results found for this or any previous visit (from the past 240 hour(s)).       Radiology Studies: Dg Chest 2 View  Result Date: 08/08/2016 CLINICAL DATA:  Initial evaluation for acute fever. EXAM: CHEST  2 VIEW COMPARISON:  Prior radiograph from 06/24/2016. FINDINGS: Transverse heart  size at the upper limits of normal, stable. Mediastinal silhouette within normal limits. Mild aortic atherosclerosis. Lungs normally inflated. Mild patchy opacity within the left perihilar region, which may reflect atelectasis and/ or small focal infiltrate. No other focal infiltrates. No pulmonary edema or pleural effusion. No pneumothorax. Azygos lobe noted. No acute osseus abnormality. Multilevel degenerative spurring noted within the visualized spine. IMPRESSION: 1. Small focus of patchy opacity within the left perihilar region, which may reflect atelectasis and/or small focal infiltrate. 2. Aortic atherosclerosis. Electronically Signed   By: Rise Mu M.D.   On: 08/08/2016  14:55   Ct Head Wo Contrast  Result Date: 08/08/2016 CLINICAL DATA:  Altered mental status with frequent UTI EXAM: CT HEAD WITHOUT CONTRAST TECHNIQUE: Contiguous axial images were obtained from the base of the skull through the vertex without intravenous contrast. COMPARISON:  None. FINDINGS: Brain: No evidence of acute infarction, hemorrhage, hydrocephalus, extra-axial collection or mass lesion/mass effect. Mild to moderate atrophy. Vascular: No hyperdense vessels. Scattered calcifications at the carotid siphons. Skull: Normal. Negative for fracture or focal lesion. Sinuses/Orbits: Mucosal thickening in the ethmoid sinuses. No acute orbital abnormality. Right lens extraction. Other: None IMPRESSION: No CT evidence for acute intracranial abnormality. Electronically Signed   By: Jasmine Pang M.D.   On: 08/08/2016 17:33   Dg Finger Thumb Left  Result Date: 08/08/2016 CLINICAL DATA:  Initial evaluation for acute left thumb pain. Hit door few days ago. EXAM: LEFT THUMB 2+V COMPARISON:  None available. FINDINGS: No acute fracture or dislocation. Osseous density adjacent to the proximal aspect of the left first proximal phalanx demonstrates smooth corticated margins, consistent with chronic finding. Radial subluxation at the  first [facility] joint, likely chronic and degenerative in nature. 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 No soft tissue abnormality. IMPRESSION: 1. No acute osseous abnormality about the left thumb. 2. 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 Radial subluxation at the first [facility] joint likely chronic and degenerative in nature. Electronically Signed   By: Rise Mu M.D.   On: 08/08/2016 14:58      Scheduled Meds: . aspirin EC  81 mg Oral Daily  . atorvastatin  20 mg Oral QHS  . carbidopa-levodopa  1 tablet Oral 6 X Daily  . citalopram  10 mg Oral QHS  . docusate sodium  100 mg Oral Daily  . ferrous sulfate  325 mg Oral Q breakfast  . gabapentin  200 mg Oral QPC supper  . levothyroxine  88 mcg Oral QAC breakfast  . rOPINIRole  2 mg Oral QHS  . sodium chloride flush  3 mL Intravenous Q12H  . tamsulosin  0.4 mg Oral QHS   Continuous Infusions: . ceFEPime (MAXIPIME) IV 1 g (08/09/16 0308)     LOS: 0 days    Time spent in minutes: 35    Calvert Cantor, MD [facility] Pager: [email or web address] Password [email or web address] 08/09/2016, 1:56 PM

## 2016-08-10 DIAGNOSIS — E871 Hypo-osmolality and hyponatremia: Secondary | ICD-10-CM

## 2016-08-10 LAB — URINE CULTURE: Special Requests: NORMAL

## 2016-08-10 MED ORDER — POLYETHYLENE GLYCOL 3350 17 G PO PACK
17.0000 g | PACK | Freq: Every day | ORAL | Status: DC | PRN
  Administered 2016-08-10: 17 g via ORAL
  Filled 2016-08-10: qty 1

## 2016-08-10 MED ORDER — CEFAZOLIN SODIUM-DEXTROSE 1-4 GM/50ML-% IV SOLN
1.0000 g | Freq: Three times a day (TID) | INTRAVENOUS | Status: DC
  Administered 2016-08-10 – 2016-08-11 (×3): 1 g via INTRAVENOUS
  Filled 2016-08-10 (×4): qty 50

## 2016-08-10 NOTE — Progress Notes (Signed)
PROGRESS NOTE    Bryan Vega   ZOX:[id number]  DOB: [birth year]  DOA: 08/08/2016 PCP: Cheron SchaumannVelazquez, Gretchen Y., MD   Brief Narrative:  Bryan Vega is a 76 y.o. gentleman with a history of Parkinson's disease (he is now on Sinemet six times daily and can take 1/2 tablet twice daily as needed based on his symptoms), autonomic dysfunction with orthostatic hypertension (attributed to Parkinson's disease per family), neurogenic bladder (also attributed to Parkinson's disease; he requires in and out cath 2-3 times daily), HTN, HLD, COPD, and hypothyroidism who presents to the ED for evaluation of low grade fever (Tmax 100 at home), increased weakness, decreased urine output, and mental status changes x three days.  Family reports that his symptoms are consistent with prior presentations for UTI.    Subjective:   Eating and drinking well. Has not had a BM in 3 days. He is incontinent of urine in between I and O caths. No other complaints.   Assessment & Plan:   Principal Problem:   Sepsis secondary to UTI  - h/o neurogenic bladder and I and O caths with resultant frequent UTIs - last culture grew pansensitive e coli - current culture is growing klebsiella - sensitivities reviewed and Cefepime changed to Ancef - CXR suggestive for possible pneumonia however the patient is asymptomatic- follow  Active Problems:   Parkinson disease  - cont Sinemet - PT/ OT eval - at baseline, his mobility is limited and he uses a walker    Neurogenic bladder - cont I and O caths TID for now     Autonomic dysfunction with orthostatic hypotension - per daughter, his BP is known to fluctuate from SBP of 170s down to 90s- will cont to follow closely  DVT prophylaxis: Lovenox Code Status: Full code Family Communication: daughter and wife Disposition Plan: follow on med/surg- home when stable Consultants:    Procedures:    Antimicrobials:  Anti-infectives    Start     Dose/Rate Route Frequency  Ordered Stop   08/10/16 1400  ceFAZolin (ANCEF) IVPB 1 g/50 mL premix     1 g 100 mL/hr over 30 Minutes Intravenous Every 8 hours 08/10/16 0804     08/09/16 0200  ceFEPIme (MAXIPIME) 1 g in dextrose 5 % 50 mL IVPB  Status:  Discontinued     1 g 100 mL/hr over 30 Minutes Intravenous Every 8 hours 08/08/16 2142 08/10/16 0804   08/08/16 1700  ceFEPIme (MAXIPIME) 1 g in dextrose 5 % 50 mL IVPB     1 g 100 mL/hr over 30 Minutes Intravenous  Once 08/08/16 1617 08/08/16 1737   08/08/16 1630  azithromycin (ZITHROMAX) tablet 500 mg  Status:  Discontinued     500 mg Oral  Once 08/08/16 1624 08/08/16 2134   08/08/16 1615  cefTRIAXone (ROCEPHIN) 1 g in dextrose 5 % 50 mL IVPB  Status:  Discontinued     1 g 100 mL/hr over 30 Minutes Intravenous  Once 08/08/16 1613 08/08/16 1617       Objective: Vitals:   08/09/16 1417 08/09/16 2005 08/10/16 0540 08/10/16 1441  BP: (!) 130/52 (!) 159/74 (!) 121/50 (!) 115/51  Pulse: 74 67 (!) 57 66  Resp: 18 18 16 18   Temp: 98.3 F (36.8 C) 98.4 F (36.9 C) 97.5 F (36.4 C) 97.4 F (36.3 C)  TempSrc: Oral Oral Oral Oral  SpO2: 100% 99% 95% 100%  Weight:      Height:        Intake/Output Summary (  Last 24 hours) at 08/10/16 1449 Last data filed at 08/10/16 1300  Gross per 24 hour  Intake              893 ml  Output             1050 ml  Net             -157 ml   Filed Weights   08/08/16 1326  Weight: 79.4 kg (175 lb)    Examination: General exam: Appears comfortable  HEENT: PERRLA, oral mucosa moist, no sclera icterus or thrush Respiratory system: Clear to auscultation. Respiratory effort normal. Cardiovascular system: S1 & S2 heard, RRR.  No murmurs  Gastrointestinal system: Abdomen soft, non-tender, nondistended. Normal bowel sound. No organomegaly Central nervous system: Alert and oriented. No focal neurological deficits. Extremities: No cyanosis, clubbing or edema Skin: No rashes or ulcers Psychiatry:  Mood & affect appropriate.      Data Reviewed: I have personally reviewed following labs and imaging studies  CBC:  Recent Labs Lab 08/08/16 1324 08/09/16 0620  WBC 8.4 6.8  HGB 10.5* 9.7*  HCT 31.9* 28.5*  MCV 89.6 89.1  PLT 157 143*   Basic Metabolic Panel:  Recent Labs Lab 08/08/16 1324 08/09/16 0620  NA 130* 136  K 4.5 4.4  CL 99* 103  CO2 26 26  GLUCOSE 105* 98  BUN 30* 25*  CREATININE 1.11 1.04  CALCIUM 9.1 9.0   GFR: Estimated Creatinine Clearance: 57.5 mL/min (by C-G formula based on SCr of 1.04 mg/dL). Liver Function Tests:  Recent Labs Lab 08/08/16 1324  AST 21  ALT <5*  ALKPHOS 61  BILITOT 0.4  PROT 6.8  ALBUMIN 4.2   No results for input(s): LIPASE, AMYLASE in the last 168 hours. No results for input(s): AMMONIA in the last 168 hours. Coagulation Profile: No results for input(s): INR, PROTIME in the last 168 hours. Cardiac Enzymes: No results for input(s): CKTOTAL, CKMB, CKMBINDEX, TROPONINI in the last 168 hours. BNP (last 3 results) No results for input(s): PROBNP in the last 8760 hours. HbA1C: No results for input(s): HGBA1C in the last 72 hours. CBG:  Recent Labs Lab 08/08/16 1324  GLUCAP 102*   Lipid Profile: No results for input(s): CHOL, HDL, LDLCALC, TRIG, CHOLHDL, LDLDIRECT in the last 72 hours. Thyroid Function Tests: No results for input(s): TSH, T4TOTAL, FREET4, T3FREE, THYROIDAB in the last 72 hours. Anemia Panel: No results for input(s): VITAMINB12, FOLATE, FERRITIN, TIBC, IRON, RETICCTPCT in the last 72 hours. Urine analysis:    Component Value Date/Time   COLORURINE YELLOW 08/08/2016 1519   APPEARANCEUR HAZY (A) 08/08/2016 1519   LABSPEC 1.010 08/08/2016 1519   PHURINE 6.0 08/08/2016 1519   GLUCOSEU 150 (A) 08/08/2016 1519   HGBUR NEGATIVE 08/08/2016 1519   BILIRUBINUR NEGATIVE 08/08/2016 1519   KETONESUR NEGATIVE 08/08/2016 1519   PROTEINUR NEGATIVE 08/08/2016 1519   NITRITE NEGATIVE 08/08/2016 1519   LEUKOCYTESUR LARGE (A)  08/08/2016 1519   Sepsis Labs: @LABRCNTIP (procalcitonin:4,lacticidven:4) ) Recent Results (from the past 240 hour(s))  Urine culture     Status: Abnormal   Collection Time: 08/08/16  3:19 PM  Result Value Ref Range Status   Specimen Description URINE, CLEAN CATCH  Final   Special Requests Normal  Final   Culture >=100,000 COLONIES/mL KLEBSIELLA OXYTOCA (A)  Final   Report Status 08/10/2016 FINAL  Final   Organism ID, Bacteria KLEBSIELLA OXYTOCA (A)  Final      Susceptibility   Klebsiella oxytoca - MIC*  AMPICILLIN RESISTANT Resistant     CEFAZOLIN <=4 SENSITIVE Sensitive     CEFTRIAXONE <=1 SENSITIVE Sensitive     CIPROFLOXACIN <=0.25 SENSITIVE Sensitive     GENTAMICIN <=1 SENSITIVE Sensitive     IMIPENEM <=0.25 SENSITIVE Sensitive     NITROFURANTOIN <=16 SENSITIVE Sensitive     TRIMETH/SULFA <=20 SENSITIVE Sensitive     AMPICILLIN/SULBACTAM <=2 SENSITIVE Sensitive     PIP/TAZO <=4 SENSITIVE Sensitive     Extended ESBL NEGATIVE Sensitive     * >=100,000 COLONIES/mL KLEBSIELLA OXYTOCA  Culture, blood (x 2)     Status: None (Preliminary result)   Collection Time: 08/08/16 10:36 PM  Result Value Ref Range Status   Specimen Description BLOOD RIGHT ANTECUBITAL  Final   Special Requests   Final    BOTTLES DRAWN AEROBIC ONLY Blood Culture adequate volume   Culture   Final    NO GROWTH 1 DAY Performed at [facility], [address], [place], [place] [address]    Report Status PENDING  Incomplete  Culture, blood (x 2)     Status: None (Preliminary result)   Collection Time: 08/08/16 10:47 PM  Result Value Ref Range Status   Specimen Description BLOOD BLOOD RIGHT HAND  Final   Special Requests   Final    BOTTLES DRAWN AEROBIC ONLY Blood Culture adequate volume   Culture   Final    NO GROWTH 1 DAY Performed at [facility], [address], [place], [place] [address]    Report Status PENDING  Incomplete         Radiology Studies: Dg Chest 2 View  Result  Date: 08/08/2016 CLINICAL DATA:  Initial evaluation for acute fever. EXAM: CHEST  2 VIEW COMPARISON:  Prior radiograph from 06/24/2016. FINDINGS: Transverse heart size at the upper limits of normal, stable. Mediastinal silhouette within normal limits. Mild aortic atherosclerosis. Lungs normally inflated. Mild patchy opacity within the left perihilar region, which may reflect atelectasis and/ or small focal infiltrate. No other focal infiltrates. No pulmonary edema or pleural effusion. No pneumothorax. Azygos lobe noted. No acute osseus abnormality. Multilevel degenerative spurring noted within the visualized spine. IMPRESSION: 1. Small focus of patchy opacity within the left perihilar region, which may reflect atelectasis and/or small focal infiltrate. 2. Aortic atherosclerosis. Electronically Signed   By: Rise Mu M.D.   On: 08/08/2016 14:55   Ct Head Wo Contrast  Result Date: 08/08/2016 CLINICAL DATA:  Altered mental status with frequent UTI EXAM: CT HEAD WITHOUT CONTRAST TECHNIQUE: Contiguous axial images were obtained from the base of the skull through the vertex without intravenous contrast. COMPARISON:  None. FINDINGS: Brain: No evidence of acute infarction, hemorrhage, hydrocephalus, extra-axial collection or mass lesion/mass effect. Mild to moderate atrophy. Vascular: No hyperdense vessels. Scattered calcifications at the carotid siphons. Skull: Normal. Negative for fracture or focal lesion. Sinuses/Orbits: Mucosal thickening in the ethmoid sinuses. No acute orbital abnormality. Right lens extraction. Other: None IMPRESSION: No CT evidence for acute intracranial abnormality. Electronically Signed   By: Jasmine Pang M.D.   On: 08/08/2016 17:33   Dg Finger Thumb Left  Result Date: 08/08/2016 CLINICAL DATA:  Initial evaluation for acute left thumb pain. Hit door few days ago. EXAM: LEFT THUMB 2+V COMPARISON:  None available. FINDINGS: No acute fracture or dislocation. Osseous density  adjacent to the proximal aspect of the left first proximal phalanx demonstrates smooth corticated margins, consistent with chronic finding. Radial subluxation at the first [facility] joint, likely chronic and degenerative  in nature. 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 No soft tissue abnormality. IMPRESSION: 1. No acute osseous abnormality about the left thumb. 2. 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 Radial subluxation at the first [facility] joint likely chronic and degenerative in nature. Electronically Signed   By: Rise Mu M.D.   On: 08/08/2016 14:58      Scheduled Meds: . aspirin EC  81 mg Oral Daily  . atorvastatin  20 mg Oral QHS  . carbidopa-levodopa  1 tablet Oral 6 X Daily  . citalopram  10 mg Oral QHS  . docusate sodium  100 mg Oral Daily  . enoxaparin (LOVENOX) injection  40 mg Subcutaneous Q24H  . ferrous sulfate  325 mg Oral Q breakfast  . gabapentin  200 mg Oral QPC supper  . levothyroxine  88 mcg Oral QAC breakfast  . rOPINIRole  2 mg Oral QHS  . sodium chloride flush  3 mL Intravenous Q12H  . tamsulosin  0.4 mg Oral QHS   Continuous Infusions: .  ceFAZolin (ANCEF) IV       LOS: 1 day    Time spent in minutes: 35    Calvert Cantor, MD [facility] Pager: [email or web address] Password [email or web address] 08/10/2016, 2:49 PM

## 2016-08-10 NOTE — Evaluation (Addendum)
Occupational Therapy Evaluation Patient Details Name: Bryan Vega MRN: [id number] DOB: 12-07-1940 Today's Date: 08/10/2016    History of Present Illness Bryan Vega is a 76 y.o. gentleman with a history of Parkinson's disease, autonomic dysfunction with orthostatic hypertension (attributed to Parkinson's disease per family), neurogenic bladder (also attributed to Parkinson's disease; he requires in and out cath 2-3 times daily), HTN, HLD, COPD, and hypothyroidism who presents to the ED for evaluation of low grade fever (Tmax 100 at home), increased weakness, decreased urine output, and mental status changes x three days.  Pt with recent L thumb injury but xray (-) for fracture of L thumb.    Clinical Impression   Pt currently requiring +2 mod to total assist with all ADL. Pt's wife present for session and is very supportive and has been providing care at home. Feel pt may need SNF unless able to progress to a level that pt's wife is able to provide care safely at home. If pt does d/c home, recommend HHOT. Will continue to follow on acute to progress ADL independence and safety.    Follow Up Recommendations  SNF;Supervision/Assistance - 24 hour (unless able to progress to a level that wife can assist pt safely at home)   Equipment Recommendations  None recommended by OT    Recommendations for Other Services       Precautions / Restrictions Precautions Precautions: Fall Restrictions Weight Bearing Restrictions: No      Mobility Bed Mobility Overal bed mobility: Needs Assistance Bed Mobility: Supine to Sit     Supine to sit: +2 for physical assistance;+2 for safety/equipment;Max assist     General bed mobility comments: assist for trunk to upright. R LE already partially over EOB upon therapy arrival. Needing assist for scooting hips around to EOB with use of pad. Limited use of L hand due to edema and pain.  Transfers Overall transfer level: Needs assistance Equipment used:  Rolling walker (2 wheeled) Transfers: Sit to/from Stand Sit to Stand: Mod assist;+2 physical assistance;+2 safety/equipment         General transfer comment: assist for weight shifting from sit to stand and balance support.     Balance Overall balance assessment: Needs assistance   Sitting balance-Leahy Scale: Poor       Standing balance-Leahy Scale: Poor                             ADL either performed or assessed with clinical judgement   ADL Overall ADL's : Needs assistance/impaired Eating/Feeding: Set up;Bed level   Grooming: Wash/dry face;Minimal assistance;Sitting   Upper Body Bathing: Maximal assistance;Sitting Upper Body Bathing Details (indicate cue type and reason): decreased sitting balance noted. Pt tending to lean to posteriorly and to the right. Lower Body Bathing: +2 for physical assistance;+2 for safety/equipment;Maximal assistance;Sit to/from stand   Upper Body Dressing : Total assistance;Sitting Upper Body Dressing Details (indicate cue type and reason): with gown at EOB. Lower Body Dressing: +2 for physical assistance;+2 for safety/equipment;Total assistance;Sit to/from stand   Toilet Transfer: +2 for physical assistance;+2 for safety/equipment;Moderate assistance;Ambulation;RW   Toileting- Clothing Manipulation and Hygiene: +2 for safety/equipment;+2 for physical assistance;Total assistance;Sit to/from stand         General ADL Comments: Pt's wife present for session and states she has to assist pt with ADL including feeding sometimes. Pt able to grip the walker but did note edema in L hand. Per chart pt with recent L thumb injury but (-)  for fracture. Pt reports sevearl times that he doesnt have "stamina" for performing tasks right now. Able to perform functional mobility with walker today with +2 mod assist.      Vision Patient Visual Report: No change from baseline       Perception     Praxis      Pertinent Vitals/Pain Pain  Assessment: Faces Faces Pain Scale: Hurts a little bit Pain Location: L hand with movement. Pain Descriptors / Indicators: Grimacing Pain Intervention(s): Monitored during session     Hand Dominance     Extremity/Trunk Assessment Upper Extremity Assessment Upper Extremity Assessment: LUE deficits/detail;Generalized weakness LUE Deficits / Details: note edema in L hand. Per chart recent L thumb injury but xray (-) per chart. Some pain with movement noted. Able to grip walker however.           Communication     Cognition Arousal/Alertness: Awake/alert Behavior During Therapy: WFL for tasks assessed/performed Overall Cognitive Status: Within Functional Limits for tasks assessed                                     General Comments       Exercises     Shoulder Instructions      Home Living Family/patient expects to be discharged to:: Private residence Living Arrangements: Spouse/significant other Available Help at Discharge: Family;Available 24 hours/day Type of Home: House Home Access: Stairs to enter     Home Layout: One level     Bathroom Shower/Tub: Walk-in shower         Home Equipment: Shower seat;Grab bars - tub/shower;Bedside commode;Walker - 4 wheels;Wheelchair - manual          Prior Functioning/Environment Level of Independence: Needs assistance  Gait / Transfers Assistance Needed: Wife A with bed mobility prior to shower,  MIN A with transfer and bed mobility, mod I ambulation ADL's / Homemaking Assistance Needed: assist for showering,uses shower seat, has aide             OT Problem List: Decreased strength;Decreased knowledge of use of DME or AE;Decreased activity tolerance      OT Treatment/Interventions: Self-care/ADL training;Patient/family education;DME and/or AE instruction;Therapeutic activities    OT Goals(Current goals can be found in the care plan section) Acute Rehab OT Goals Patient Stated Goal: to get out of  the bed and regain strength OT Goal Formulation: With patient/family Time For Goal Achievement: 08/24/16 Potential to Achieve Goals: Good  OT Frequency: Min 2X/week   Barriers to D/C:            Co-evaluation PT/OT/SLP Co-Evaluation/Treatment: Yes Reason for Co-Treatment: For patient/therapist safety   OT goals addressed during session: ADL's and self-care;Proper use of Adaptive equipment and DME      AM-PAC PT "6 Clicks" Daily Activity     Outcome Measure Help from another person eating meals?: A Little Help from another person taking care of personal grooming?: A Little Help from another person toileting, which includes using toliet, bedpan, or urinal?: A Lot Help from another person bathing (including washing, rinsing, drying)?: A Lot Help from another person to put on and taking off regular upper body clothing?: A Lot Help from another person to put on and taking off regular lower body clothing?: A Lot 6 Click Score: 14   End of Session Equipment Utilized During Treatment: Gait belt;Rolling walker  Activity Tolerance: Patient limited by fatigue Patient left: in  chair;with call bell/phone within reach;with chair alarm set;with family/visitor present  OT Visit Diagnosis: Unsteadiness on feet (R26.81);Muscle weakness (generalized) (M62.81)                Time: 5784-69621159-1223 OT Time Calculation (min): 24 min Charges:  OT General Charges $OT Visit: 1 Procedure OT Evaluation $OT Eval Low Complexity: 1 Procedure G-Codes:       Zannie KehrStephanie S Deeric Cruise 08/10/2016, 1:16 PM

## 2016-08-10 NOTE — Progress Notes (Signed)
Please note that pt has [facility] home health. Information is in patient's room. Spouse at bedside.  [phone]

## 2016-08-10 NOTE — Evaluation (Signed)
Physical Therapy Evaluation Patient Details Name: Alfred LevinsManilal Dekay MRN: [id number] DOB: 1940-06-24 Today's Date: 08/10/2016   History of Present Illness  Bryan Vega is a 76 y.o. gentleman with a history of Parkinson's disease, autonomic dysfunction with orthostatic hypertension (attributed to Parkinson's disease per family), neurogenic bladder (also attributed to Parkinson's disease; he requires in and out cath 2-3 times daily), HTN, HLD, COPD, and hypothyroidism; pt presents to the ED on 08/08/16 with  fever, increased weakness, decreased urine output, and mental status changes x 3d prior to adm  Pt with recent L thumb injury but xray (-) for fracture of L thumb.   Clinical Impression  Pt admitted with above diagnosis. Pt currently with functional limitations due to the deficits listed below (see PT Problem List).  Pt will benefit from skilled PT to increase their independence and safety with mobility to allow discharge to the venue listed below.  Pt very cooperative and motivated to work with therapy; will continue to follow      Follow Up Recommendations Home health PT (vs SNF, family agreeable if necessary-prefer home)    Equipment Recommendations  Other (comment);Wheelchair (measurements PT) (requesting new hospital bed(?))    Recommendations for Other Services       Precautions / Restrictions Precautions Precautions: Fall Restrictions Weight Bearing Restrictions: No      Mobility  Bed Mobility Overal bed mobility: Needs Assistance Bed Mobility: Supine to Sit     Supine to sit: +2 for physical assistance;+2 for safety/equipment;Max assist     General bed mobility comments: assist for trunk to upright. R LE already partially over EOB upon therapy arrival. Needing assist for scooting hips around to EOB with use of pad. Limited use of L hand due to edema and pain.  Transfers Overall transfer level: Needs assistance Equipment used: Rolling walker (2 wheeled) Transfers: Sit  to/from Stand Sit to Stand: Mod assist;+2 physical assistance;+2 safety/equipment;From elevated surface         General transfer comment: assist for weight shifting from sit to stand and balance support, bed ht elevated slightly; cues for trunk/hip extension  Ambulation/Gait Ambulation/Gait assistance: Min assist Ambulation Distance (Feet): 20 Feet Assistive device: Rolling walker (2 wheeled) Gait Pattern/deviations: Step-to pattern;Step-through pattern;Decreased stride length;Trunk flexed     General Gait Details: multi-modal cues and facilitation of wt shift, trunk extension, walker progression; very slow gait, assist for balance and to maneuver RW  Stairs            Wheelchair Mobility    Modified Rankin (Stroke Patients Only)       Balance Overall balance assessment: Needs assistance Sitting-balance support: No upper extremity supported;Feet supported Sitting balance-Leahy Scale: Fair Sitting balance - Comments: pt with intermittent LOB posteriorly, assist to achieve midline and able to maintain for ~30sec, cues to correct, delayed reactions Postural control: Posterior lean   Standing balance-Leahy Scale: Poor Standing balance comment: requires external assist and UE support for midline                             Pertinent Vitals/Pain Pain Assessment: Faces Faces Pain Scale: Hurts a little bit Pain Location: L hand with movement. Pain Descriptors / Indicators: Grimacing Pain Intervention(s): Monitored during session    Home Living Family/patient expects to be discharged to:: Private residence Living Arrangements: Spouse/significant other Available Help at Discharge: Family;Available 24 hours/day Type of Home: House Home Access: Stairs to enter     Home Layout: One level  Home Equipment: Shower seat;Grab bars - tub/shower;Bedside commode;Walker - 4 wheels;Wheelchair - manual      Prior Function Level of Independence: Needs assistance    Gait / Transfers Assistance Needed: Wife A with bed mobility prior to shower,  MIN A with transfer and bed mobility, mod I ambulation  ADL's / Homemaking Assistance Needed: assist for showering,uses shower seat, has aide         Hand Dominance        Extremity/Trunk Assessment   Upper Extremity Assessment Upper Extremity Assessment: Defer to OT evaluation LUE Deficits / Details: note edema in L hand. Per chart recent L thumb injury but xray (-) per chart. Some pain with movement noted. Able to grip walker however.    Lower Extremity Assessment Lower Extremity Assessment: Generalized weakness    Cervical / Trunk Assessment Cervical / Trunk Assessment: Kyphotic  Communication      Cognition Arousal/Alertness: Awake/alert Behavior During Therapy: WFL for tasks assessed/performed Overall Cognitive Status: Within Functional Limits for tasks assessed                                        General Comments      Exercises     Assessment/Plan    PT Assessment Patient needs continued PT services  PT Problem List Decreased strength;Decreased range of motion;Decreased activity tolerance;Decreased balance;Decreased knowledge of use of DME;Decreased mobility       PT Treatment Interventions DME instruction;Gait training;Functional mobility training;Therapeutic activities;Therapeutic exercise;Patient/family education    PT Goals (Current goals can be found in the Care Plan section)  Acute Rehab PT Goals Patient Stated Goal: to get out of the bed and regain strength PT Goal Formulation: With patient/family Time For Goal Achievement: 08/17/16 Potential to Achieve Goals: Good    Frequency Min 3X/week   Barriers to discharge        Co-evaluation PT/OT/SLP Co-Evaluation/Treatment: Yes Reason for Co-Treatment: For patient/therapist safety PT goals addressed during session: Mobility/safety with mobility;Proper use of DME OT goals addressed during session:  ADL's and self-care;Proper use of Adaptive equipment and DME       AM-PAC PT "6 Clicks" Daily Activity  Outcome Measure Difficulty turning over in bed (including adjusting bedclothes, sheets and blankets)?: Total Difficulty moving from lying on back to sitting on the side of the bed? : A Lot Difficulty sitting down on and standing up from a chair with arms (e.g., wheelchair, bedside commode, etc,.)?: A Lot Help needed moving to and from a bed to chair (including a wheelchair)?: A Lot Help needed walking in hospital room?: A Lot Help needed climbing 3-5 steps with a railing? : A Lot 6 Click Score: 11    End of Session Equipment Utilized During Treatment: Gait belt Activity Tolerance: Patient tolerated treatment well Patient left: in chair;with call bell/phone within reach;with family/visitor present;with chair alarm set Nurse Communication: Mobility status PT Visit Diagnosis: Muscle weakness (generalized) (M62.81);Other symptoms and signs involving the nervous system (R29.898);Other abnormalities of gait and mobility (R26.89)    Time: 4098-1191 PT Time Calculation (min) (ACUTE ONLY): 23 min   Charges:   PT Evaluation $PT Eval Moderate Complexity: 1 Procedure     PT G CodesDrucilla Chalet, PT Pager: [phone] 08/10/2016   [facility] 08/10/2016, 2:43 PM

## 2016-08-10 NOTE — Progress Notes (Signed)
Patient and wife requested condom catheter during night. Checked on patient and pt had 100cc out so far since shift change. Discussed doing I&O cath now but patient declined stating he is not uncomfortable. Will c/t monitor output. Wife at beside. Patient resting comfortably.

## 2016-08-11 DIAGNOSIS — R531 Weakness: Secondary | ICD-10-CM

## 2016-08-11 LAB — BASIC METABOLIC PANEL
ANION GAP: 8 (ref 5–15)
BUN: 27 mg/dL — AB (ref 6–20)
CALCIUM: 9.2 mg/dL (ref 8.9–10.3)
CO2: 28 mmol/L (ref 22–32)
Chloride: 102 mmol/L (ref 101–111)
Creatinine, Ser: 0.99 mg/dL (ref 0.61–1.24)
GFR calc Af Amer: >60 mL/min (ref >60)
GFR calc non Af Amer: >60 mL/min (ref >60)
GLUCOSE: 130 mg/dL — AB (ref 65–99)
Potassium: 4.4 mmol/L (ref 3.5–5.1)
Sodium: 138 mmol/L (ref 135–145)

## 2016-08-11 LAB — CBC
HEMATOCRIT: 31.4 % — AB (ref 39.0–52.0)
HEMOGLOBIN: 10.3 g/dL — AB (ref 13.0–17.0)
MCH: 29.7 pg (ref 26.0–34.0)
MCHC: 32.8 g/dL (ref 30.0–36.0)
MCV: 90.5 fL (ref 78.0–100.0)
Platelets: 149 10*3/uL — ABNORMAL LOW (ref 150–400)
RBC: 3.47 MIL/uL — ABNORMAL LOW (ref 4.22–5.81)
RDW: 13.4 % (ref 11.5–15.5)
WBC: 5.3 10*3/uL (ref 4.0–10.5)

## 2016-08-11 MED ORDER — CEPHALEXIN 500 MG PO CAPS: 500.0000 mg | ORAL_CAPSULE | Freq: Two times a day (BID) | ORAL | 0 refills | Status: DC

## 2016-08-11 MED ORDER — POLYETHYLENE GLYCOL 3350 17 G PO PACK: 17.0000 g | PACK | Freq: Every day | ORAL | 0 refills | Status: DC | PRN

## 2016-08-11 MED ORDER — CEPHALEXIN 500 MG PO CAPS
500.0000 mg | ORAL_CAPSULE | Freq: Two times a day (BID) | ORAL | Status: DC
  Administered 2016-08-11: 500 mg via ORAL
  Filled 2016-08-11: qty 1

## 2016-08-11 NOTE — Progress Notes (Signed)
Date: Aug 11, 2016  Discharge orders review for case management needs.  ot, pt, and aide ordered.  Per the wife [facility] notified and information faxed to company in [place].  Successful fax notification received. [name], BSN, RN3, CCM:  [phone]

## 2016-08-11 NOTE — Progress Notes (Signed)
Patient given discharge instructions, and verbalized an understanding of all discharge instructions.  Patient agrees with discharge plan, and is being discharged in stable medical condition.  Patient given transportation via wheelchair. 

## 2016-08-11 NOTE — Discharge Summary (Signed)
Physician Discharge Summary  Bryan Vega AYT:[id number] DOB: [birth year] DOA: 08/08/2016  PCP: Cheron SchaumannVelazquez, Gretchen Y., MD  Admit date: 08/08/2016 Discharge date: 08/11/2016  Admitted From: home  Disposition:  home   Recommendations for Outpatient Follow-up:  1. should self cath at least 3 times a day  Home Health:  PT and Aid  Equipment/Devices:  Has a walker    Discharge Condition:  stable   CODE STATUS:  Full code   Consultations:  none    Discharge Diagnoses:  Principal Problem:   Sepsis secondary to UTI [facility](HCC) Active Problems:   Acute lower UTI   Generalized weakness   Parkinson disease (HCC)   Neurogenic bladder   Recurrent UTI   Orthostatic hypertension   Autonomic dysfunction    Subjective: He has no complaints today. No pain, fever chills. Continues to have incontinence in the day in between caths.   Brief Summary: Bryan Vega a 76 y.o.gentleman with a history of Parkinson's disease (he is now on Sinemet six times daily and can take 1/2 tablet twice daily as needed based on his symptoms), autonomic dysfunction with orthostatic hypertension (attributed to Parkinson's disease per family), neurogenic bladder (also attributed to Parkinson's disease; he requires in and out cath 2-3 times daily), HTN, HLD, COPD, and hypothyroidism who presents to the ED for evaluation of low grade fever (Tmax 100 at home), increased weakness, decreased urine output, and mental status changes x three days. Family reports that his symptoms are consistent with prior presentations for UTI.   Hospital Course:  Principal Problem:   Sepsis secondary to UTI  - h/o neurogenic bladder and I and O caths with resultant frequent UTIs - last culture grew pansensitive e coli - current culture is growing klebsiella - sensitivities reviewed and Cefepime changed to Ancef - CXR suggestive for atelectasis vs infiltrate- the patient is asymptomatic and therefore we have not treated him for this -  will be discharged with Keflex  Active Problems:   Parkinson disease  - cont Sinemet - PT/ OT eval - at baseline, his mobility is limited and he uses a walker    Neurogenic bladder - he states that he has to urinate at least once during the day when he is at home- in the hospital he has been incontinent or leaking into his condom cath - his wife usually catheterizes him BID and obtains about 2-300 cc each time - over night he wears a condom cath and usually has urine in the bag by the AM.  I do feel that he is holding on to urine during the day and night (and having overflow incontinence at night) which is putting him at a higher risk for UTI- at this time I recommend at least TID caths - obviously not draining the bladder overnight will result in persistent UTIs as well - he has a urologist who is managing his issues at outpt      Autonomic dysfunction with orthostatic hypotension - per daughter, his BP is known to fluctuate from SBP of 170s down to 90s-   Discharge Instructions  Discharge Instructions    Diet - low sodium heart healthy    Complete by:  As directed    Increase activity slowly    Complete by:  As directed      Allergies as of 08/11/2016   No Known Allergies     Medication List    TAKE these medications   acetaminophen 500 MG tablet Commonly known as:  TYLENOL Take 1,000 mg  by mouth every 6 (six) hours as needed for fever.   aspirin EC 81 MG tablet Take 81 mg by mouth daily.   atorvastatin 20 MG tablet Commonly known as:  LIPITOR Take 20 mg by mouth at bedtime.   CALCIUM PO Take 1 tablet by mouth daily.   carbidopa-levodopa 25-100 MG tablet Commonly known as:  SINEMET IR Take 1 tablet by mouth every 2 (two) hours.   cephALEXin 500 MG capsule Commonly known as:  KEFLEX Take 1 capsule (500 mg total) by mouth every 12 (twelve) hours.   citalopram 20 MG tablet Commonly known as:  CELEXA Take 0.5 tablets (10 mg total) by mouth at bedtime.    docusate sodium 100 MG capsule Commonly known as:  COLACE Take 100 mg by mouth daily.   ferrous sulfate 325 (65 FE) MG tablet Take 325 mg by mouth daily with breakfast.   FISH OIL PO Take 1 capsule by mouth daily.   gabapentin 100 MG capsule Commonly known as:  NEURONTIN Take 200 mg by mouth daily after supper.   levothyroxine 88 MCG tablet Commonly known as:  SYNTHROID, LEVOTHROID Take 88 mcg by mouth daily before breakfast.   polyethylene glycol packet Commonly known as:  MIRALAX / GLYCOLAX Take 17 g by mouth daily as needed for mild constipation.   rOPINIRole 0.5 MG tablet Commonly known as:  REQUIP Take by mouth. Take 1mg  in the evening and 1.5mg  at bedtime   tamsulosin 0.4 MG Caps capsule Commonly known as:  FLOMAX Take 0.4 mg by mouth at bedtime.       No Known Allergies   Procedures/Studies:    Dg Chest 2 View  Result Date: 08/08/2016 CLINICAL DATA:  Initial evaluation for acute fever. EXAM: CHEST  2 VIEW COMPARISON:  Prior radiograph from 06/24/2016. FINDINGS: Transverse heart size at the upper limits of normal, stable. Mediastinal silhouette within normal limits. Mild aortic atherosclerosis. Lungs normally inflated. Mild patchy opacity within the left perihilar region, which may reflect atelectasis and/ or small focal infiltrate. No other focal infiltrates. No pulmonary edema or pleural effusion. No pneumothorax. Azygos lobe noted. No acute osseus abnormality. Multilevel degenerative spurring noted within the visualized spine. IMPRESSION: 1. Small focus of patchy opacity within the left perihilar region, which may reflect atelectasis and/or small focal infiltrate. 2. Aortic atherosclerosis. Electronically Signed   By: Rise Mu M.D.   On: 08/08/2016 14:55   Ct Head Wo Contrast  Result Date: 08/08/2016 CLINICAL DATA:  Altered mental status with frequent UTI EXAM: CT HEAD WITHOUT CONTRAST TECHNIQUE: Contiguous axial images were obtained from the base  of the skull through the vertex without intravenous contrast. COMPARISON:  None. FINDINGS: Brain: No evidence of acute infarction, hemorrhage, hydrocephalus, extra-axial collection or mass lesion/mass effect. Mild to moderate atrophy. Vascular: No hyperdense vessels. Scattered calcifications at the carotid siphons. Skull: Normal. Negative for fracture or focal lesion. Sinuses/Orbits: Mucosal thickening in the ethmoid sinuses. No acute orbital abnormality. Right lens extraction. Other: None IMPRESSION: No CT evidence for acute intracranial abnormality. Electronically Signed   By: Jasmine Pang M.D.   On: 08/08/2016 17:33   Dg Finger Thumb Left  Result Date: 08/08/2016 CLINICAL DATA:  Initial evaluation for acute left thumb pain. Hit door few days ago. EXAM: LEFT THUMB 2+V COMPARISON:  None available. FINDINGS: No acute fracture or dislocation. Osseous density adjacent to the proximal aspect of the left first proximal phalanx demonstrates smooth corticated margins, consistent with chronic finding. Radial subluxation at the first [facility] joint,  likely chronic and degenerative in nature. 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 No soft tissue abnormality. IMPRESSION: 1. No acute osseous abnormality about the left thumb. 2. 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 Radial subluxation at the first [facility] joint likely chronic and degenerative in nature. Electronically Signed   By: Rise Mu M.D.   On: 08/08/2016 14:58       Discharge Exam: Vitals:   08/10/16 2018 08/11/16 0503  BP: (!) 113/48 129/64  Pulse: 70 (!) 55  Resp: 18 16  Temp: 97.8 F (36.6 C) 97.6 F (36.4 C)   Vitals:   08/10/16 0540 08/10/16 1441 08/10/16 2018 08/11/16 0503  BP: (!) 121/50 (!) 115/51 (!) 113/48 129/64  Pulse: (!) 57 66 70 (!) 55  Resp: 16 18 18 16   Temp: 97.5 F (36.4 C) 97.4 F (36.3 C) 97.8 F (36.6 C) 97.6 F (36.4 C)  TempSrc: Oral Oral Oral  Axillary  SpO2: 95% 100% 100% 98%  Weight:      Height:        General: Pt is alert, awake, not in acute distress Cardiovascular: RRR, S1/S2 +, no rubs, no gallops Respiratory: CTA bilaterally, no wheezing, no rhonchi Abdominal: Soft, NT, ND, bowel sounds + Extremities: no edema, no cyanosis    The results of significant diagnostics from this hospitalization (including imaging, microbiology, ancillary and laboratory) are listed below for reference.     Microbiology: Recent Results (from the past 240 hour(s))  Urine culture     Status: Abnormal   Collection Time: 08/08/16  3:19 PM  Result Value Ref Range Status   Specimen Description URINE, CLEAN CATCH  Final   Special Requests Normal  Final   Culture >=100,000 COLONIES/mL KLEBSIELLA OXYTOCA (A)  Final   Report Status 08/10/2016 FINAL  Final   Organism ID, Bacteria KLEBSIELLA OXYTOCA (A)  Final      Susceptibility   Klebsiella oxytoca - MIC*    AMPICILLIN RESISTANT Resistant     CEFAZOLIN <=4 SENSITIVE Sensitive     CEFTRIAXONE <=1 SENSITIVE Sensitive     CIPROFLOXACIN <=0.25 SENSITIVE Sensitive     GENTAMICIN <=1 SENSITIVE Sensitive     IMIPENEM <=0.25 SENSITIVE Sensitive     NITROFURANTOIN <=16 SENSITIVE Sensitive     TRIMETH/SULFA <=20 SENSITIVE Sensitive     AMPICILLIN/SULBACTAM <=2 SENSITIVE Sensitive     PIP/TAZO <=4 SENSITIVE Sensitive     Extended ESBL NEGATIVE Sensitive     * >=100,000 COLONIES/mL KLEBSIELLA OXYTOCA  Culture, blood (x 2)     Status: None (Preliminary result)   Collection Time: 08/08/16 10:36 PM  Result Value Ref Range Status   Specimen Description BLOOD RIGHT ANTECUBITAL  Final   Special Requests   Final    BOTTLES DRAWN AEROBIC ONLY Blood Culture adequate volume   Culture   Final    NO GROWTH 2 DAYS Performed at [facility], [address] [place], [place] [address]    Report Status PENDING  Incomplete  Culture, blood (x 2)     Status: None (Preliminary result)   Collection Time:  08/08/16 10:47 PM  Result Value Ref Range Status   Specimen Description BLOOD BLOOD RIGHT HAND  Final   Special Requests   Final    BOTTLES DRAWN AEROBIC ONLY Blood Culture adequate volume   Culture   Final    NO GROWTH 2 DAYS Performed at [facility], [address] [place], [place] [address]  Report Status PENDING  Incomplete     Labs: BNP (last 3 results)  Recent Labs  06/24/16 1426 06/24/16 2037  BNP 104.2* 155.1*   Basic Metabolic Panel:  Recent Labs Lab 08/08/16 1324 08/09/16 0620 08/11/16 0805  NA 130* 136 138  K 4.5 4.4 4.4  CL 99* 103 102  CO2 26 26 28   GLUCOSE 105* 98 130*  BUN 30* 25* 27*  CREATININE 1.11 1.04 0.99  CALCIUM 9.1 9.0 9.2   Liver Function Tests:  Recent Labs Lab 08/08/16 1324  AST 21  ALT <5*  ALKPHOS 61  BILITOT 0.4  PROT 6.8  ALBUMIN 4.2   No results for input(s): LIPASE, AMYLASE in the last 168 hours. No results for input(s): AMMONIA in the last 168 hours. CBC:  Recent Labs Lab 08/08/16 1324 08/09/16 0620 08/11/16 0805  WBC 8.4 6.8 5.3  HGB 10.5* 9.7* 10.3*  HCT 31.9* 28.5* 31.4*  MCV 89.6 89.1 90.5  PLT 157 143* 149*   Cardiac Enzymes: No results for input(s): CKTOTAL, CKMB, CKMBINDEX, TROPONINI in the last 168 hours. BNP: Invalid input(s): POCBNP CBG:  Recent Labs Lab 08/08/16 1324  GLUCAP 102*   D-Dimer No results for input(s): DDIMER in the last 72 hours. Hgb A1c No results for input(s): HGBA1C in the last 72 hours. Lipid Profile No results for input(s): CHOL, HDL, LDLCALC, TRIG, CHOLHDL, LDLDIRECT in the last 72 hours. Thyroid function studies No results for input(s): TSH, T4TOTAL, T3FREE, THYROIDAB in the last 72 hours.  Invalid input(s): FREET3 Anemia work up No results for input(s): VITAMINB12, FOLATE, FERRITIN, TIBC, IRON, RETICCTPCT in the last 72 hours. Urinalysis    Component Value Date/Time   COLORURINE YELLOW 08/08/2016 1519   APPEARANCEUR HAZY (A) 08/08/2016 1519   LABSPEC  1.010 08/08/2016 1519   PHURINE 6.0 08/08/2016 1519   GLUCOSEU 150 (A) 08/08/2016 1519   HGBUR NEGATIVE 08/08/2016 1519   BILIRUBINUR NEGATIVE 08/08/2016 1519   KETONESUR NEGATIVE 08/08/2016 1519   PROTEINUR NEGATIVE 08/08/2016 1519   NITRITE NEGATIVE 08/08/2016 1519   LEUKOCYTESUR LARGE (A) 08/08/2016 1519   Sepsis Labs Invalid input(s): PROCALCITONIN,  WBC,  LACTICIDVEN Microbiology Recent Results (from the past 240 hour(s))  Urine culture     Status: Abnormal   Collection Time: 08/08/16  3:19 PM  Result Value Ref Range Status   Specimen Description URINE, CLEAN CATCH  Final   Special Requests Normal  Final   Culture >=100,000 COLONIES/mL KLEBSIELLA OXYTOCA (A)  Final   Report Status 08/10/2016 FINAL  Final   Organism ID, Bacteria KLEBSIELLA OXYTOCA (A)  Final      Susceptibility   Klebsiella oxytoca - MIC*    AMPICILLIN RESISTANT Resistant     CEFAZOLIN <=4 SENSITIVE Sensitive     CEFTRIAXONE <=1 SENSITIVE Sensitive     CIPROFLOXACIN <=0.25 SENSITIVE Sensitive     GENTAMICIN <=1 SENSITIVE Sensitive     IMIPENEM <=0.25 SENSITIVE Sensitive     NITROFURANTOIN <=16 SENSITIVE Sensitive     TRIMETH/SULFA <=20 SENSITIVE Sensitive     AMPICILLIN/SULBACTAM <=2 SENSITIVE Sensitive     PIP/TAZO <=4 SENSITIVE Sensitive     Extended ESBL NEGATIVE Sensitive     * >=100,000 COLONIES/mL KLEBSIELLA OXYTOCA  Culture, blood (x 2)     Status: None (Preliminary result)   Collection Time: 08/08/16 10:36 PM  Result Value Ref Range Status   Specimen Description BLOOD RIGHT ANTECUBITAL  Final   Special Requests   Final    BOTTLES DRAWN AEROBIC ONLY Blood  Culture adequate volume   Culture   Final    NO GROWTH 2 DAYS Performed at [facility], [address] [place], [place] [address]    Report Status PENDING  Incomplete  Culture, blood (x 2)     Status: None (Preliminary result)   Collection Time: 08/08/16 10:47 PM  Result Value Ref Range Status   Specimen Description BLOOD BLOOD  RIGHT HAND  Final   Special Requests   Final    BOTTLES DRAWN AEROBIC ONLY Blood Culture adequate volume   Culture   Final    NO GROWTH 2 DAYS Performed at [facility], [address] [place], [place] [address]    Report Status PENDING  Incomplete     Time coordinating discharge: Over 30 minutes  SIGNED:   Calvert Cantor, MD  [facility] 08/11/2016, 9:45 AM Pager   If 7PM-7AM, please contact night-coverage [email or web address] Password [email or web address]

## 2016-08-11 NOTE — Discharge Instructions (Signed)
Please increase I and O caths to at least TID. If you have to urinate more during the day, cath must be done after each episode of urination.   Please take all your medications with you for your next visit with your Primary MD. Please request your Primary MD to go over all hospital test results at the follow up. Please ask your Primary MD to get all Hospital records sent to his/her office.  If you experience worsening of your admission symptoms, develop shortness of breath, chest pain, suicidal or homicidal thoughts or a life threatening emergency, you must seek medical attention immediately by calling 911 or calling your MD.  Bonita QuinYou must read the complete instructions/literature along with all the possible adverse reactions/side effects for all the medicines you take including new medications that have been prescribed to you. Take new medicines after you have completely understood and accpet all the possible adverse reactions/side effects.   Do not drive when taking pain medications or sedatives.    Do not take more than prescribed Pain, Sleep and Anxiety Medications  If you have smoked or chewed Tobacco in the last 2 yrs please stop. Stop any regular alcohol and or recreational drug use.  Wear Seat belts while driving.

## 2016-08-12 ENCOUNTER — Telehealth: Payer: Self-pay | Admitting: Neurology

## 2016-08-12 NOTE — Telephone Encounter (Signed)
Agree.  He was literally d/c 4 days ago and it is too soon to make PD med changes post hospital, as that in itself will make symptoms worse.  I believe his daughter is an NP, but not sure that she necessarily is an NP in neurology.

## 2016-08-12 NOTE — Telephone Encounter (Signed)
Daughter's name is Dharti and the phone number is [phone]

## 2016-08-12 NOTE — Telephone Encounter (Signed)
Spoke with Bryan Vega. She states that patient can not move as well as he was moving. His blood pressure has been good. She is very nervous and wants him seen.  She was made aware he was just d/c due to severe UTI and this will make Parkinson's symptoms worse. This takes a while to return to baseline. She still wants him seen sooner, but I assured Bryan Vega Dr. Arbutus Leasat wouldn't change medications until he was completely recovered from UTI.  Dr. Arbutus Leasat please advise.  He has follow up the first week in July.

## 2016-08-12 NOTE — Telephone Encounter (Signed)
Did they leave a contact number or name for daughter (so I know which number to pick)?

## 2016-08-12 NOTE — Telephone Encounter (Signed)
Daughter made aware 

## 2016-08-12 NOTE — Telephone Encounter (Signed)
Caller: PT's daughter  Urgent?   Reason for the call: PT just got out of the hospital and she wanted a sooner appointment with Dr Tat and asked he be seen this week, please advise

## 2016-08-14 LAB — CULTURE, BLOOD (ROUTINE X 2)
CULTURE: NO GROWTH
Culture: NO GROWTH
SPECIAL REQUESTS: ADEQUATE
Special Requests: ADEQUATE

## 2016-08-25 ENCOUNTER — Telehealth: Payer: Self-pay | Admitting: Neurology

## 2016-08-25 NOTE — Telephone Encounter (Signed)
Have her try to alternate giving 1.5 tablets for 3 of the 6 times he takes the medication, then 1 tablet for the other 3 times of the day and see if there is any improvement over the next week of taking it. Continue to monitor BP. Thanks

## 2016-08-25 NOTE — Telephone Encounter (Signed)
Left message to call office

## 2016-08-25 NOTE — Telephone Encounter (Signed)
Pls let daughter know Dr. Arbutus Leasat is out until Monday next week. On review of notes, it appears Dr. Arbutus Leasat had him on Sinemet 25/100 1 tab 6 times a day, and that when he has these bouts of worsening, he was taking an additional 1/2 tablet. Is he still doing this, and how often? Thanks

## 2016-08-25 NOTE — Telephone Encounter (Signed)
Patients daughter expressed concern that patient is getting worse. He has speech problems due to excess saliva and pain in the back of his neck like his muscles are pulling at least 3-4 times a day (these were problems present at his last visit). He is now having periods throughout the day were he states he cannot walk because he doesn't feel strong enough. His blood pressure has been normal and he has completed his antibiotics from his recent hospital visit.   Please advise medication changes.

## 2016-08-25 NOTE — Telephone Encounter (Signed)
Spoke to pts daughter and informed her of medication changes and to let us know if she should need any refills and to call back in one week with updates. She verbalized understanding.

## 2016-08-25 NOTE — Telephone Encounter (Signed)
PT's daughter called in regards to him having more parkinson's symptoms and wanted a call back to discuss what is going on

## 2016-08-25 NOTE — Telephone Encounter (Signed)
Bryan Vega, fyi about your patient.

## 2016-08-25 NOTE — Telephone Encounter (Signed)
Pts daughter reports he is taking Sinemet25/100 1 tab six times a day and he takes an extra 1/2 a few times a week. She reports it is hard to gauge because if they have just given him his medication they not to give him another half.

## 2016-08-26 NOTE — Telephone Encounter (Signed)
Sounds good for now.

## 2016-09-09 ENCOUNTER — Telehealth: Payer: Self-pay | Admitting: Neurology

## 2016-09-09 NOTE — Telephone Encounter (Signed)
PT's daughter called and said his Parkinson's is getting worse and he is barely moving at all during the day and wants to know if Dr Tat can suggest anything until his next appointment

## 2016-09-09 NOTE — Telephone Encounter (Signed)
Patients daughter made aware

## 2016-09-09 NOTE — Telephone Encounter (Signed)
I'm not really sure that I can further adjust his medications without an office visit.

## 2016-09-09 NOTE — Telephone Encounter (Signed)
Spoke with patient's daughter. She states that after patient changed Levodopa dosage to 1.5/1/1.5/1/1.5/1 that he was doing better but for the past 4-5 days this has been worse again. Having pulling of the neck. Unsteadiness. Weakness. He barely walks but to go to the bathroom and even then needs a lot of assistance. His blood pressures are fluctuating again with top numbers anywhere from 70-190 and lower numbers 40-90. He has scheduled appt in 2.5 weeks.  Please advise.

## 2016-09-09 NOTE — Telephone Encounter (Signed)
Spoke with patient's daughter and she states that neck pulling not related to medication. She at first said these episodes last 45 min-2 hours, but then states that you can't really tell a time frame because they seem to occur all day everyday for the past 4-5 days with very little time between.  He is not on BP meds right now. He had weaned off when you adjusted Levodopa meds.  Please advise.

## 2016-09-09 NOTE — Telephone Encounter (Signed)
Find out if pulling of neck related to timing of meds (just given meds vs not).  What times are they actually giving the carbidopa/levodopa 25/100?  Is he on BP medication?

## 2016-09-25 NOTE — Progress Notes (Signed)
Bryan Vega was seen today in the movement disorders clinic for neurologic consultation at the request of [name], PA-C.    This patient is accompanied in the office by his son and wife who supplements the history. The consultation is for the evaluation of PD.  He was previously seen by Dr. Hyacinth Meeker at [facility].  The records that were made available to me were reviewed.  He was last seen by Dr. Hyacinth Meeker on 06/04/16.   Pt dx with PD in approximately 2011/2012 and his first sx was R hand tremor.  He was started on carbidopa/levodopa 25/100 at the time of dx and has never been on any other medication for the diagnosis.  He is currently on carbidopa/levodopa 25/100, 2 po tid (6am/12pm/5pm).  Pt isn't sure that med has ever been helpful but son thinks that it has.    His PD meds have been adjusted (up and down) over time to accomodate [facility].  Just recently, PCP added labetolol and hydralazine because BP max were in 180's.  Not long thereafter (06/24/16) [facility].  He was also hyponatremic during that stay and celexa dose decreased as that was felt possible contributor to etiology.  He followed back up with his treating physician assistant and had high blood pressures and then norvasc added on 07/11/16.  09/26/16 update:  Patient seen today, accompanied by his wife and son in law who supplement the history.  The patient is now on carbidopa/levodopa 25/100 on the following schedule: 1.5 tablet/1 tablet/1.5 tablet/1 tablet/1.5 tablet/1 tablet.  He was in the hospital for urosepsis.  This significantly brought out symptoms.  This was at the end of May.  I reviewed those records.  He is now off of all blood pressure medications.  On requip 0.5 mg, 3 tablets after dinner and 2 at bed.  It was increased with the course of time.  His daughter states that it was started because of a pulling sensation in the legs.  Had 2 falls related to freezing.  No hallucinations.  Noting still having BP  fluctuations   PREVIOUS MEDICATIONS: Sinemet  ALLERGIES:  No Known Allergies  CURRENT MEDICATIONS:  Outpatient Encounter Prescriptions as of 09/26/2016  Medication Sig  . acetaminophen (TYLENOL) 500 MG tablet Take 1,000 mg by mouth every 6 (six) hours as needed for fever.  Marland Kitchen aspirin EC 81 MG tablet Take 81 mg by mouth daily.  Marland Kitchen atorvastatin (LIPITOR) 20 MG tablet Take 20 mg by mouth at bedtime.   Marland Kitchen CALCIUM PO Take 1 tablet by mouth daily.  . carbidopa-levodopa (SINEMET IR) 25-100 MG tablet 1.5/1/1.5/1/1.5/1  . cephALEXin (KEFLEX) 500 MG capsule Take 1 capsule (500 mg total) by mouth every 12 (twelve) hours.  . citalopram (CELEXA) 20 MG tablet Take 0.5 tablets (10 mg total) by mouth at bedtime.  . docusate sodium (COLACE) 100 MG capsule Take 100 mg by mouth daily.   . ferrous sulfate 325 (65 FE) MG tablet Take 325 mg by mouth daily with breakfast.  . gabapentin (NEURONTIN) 100 MG capsule Take 2 capsules (200 mg total) by mouth daily after supper.  . levothyroxine (SYNTHROID, LEVOTHROID) 88 MCG tablet Take 88 mcg by mouth daily before breakfast.  . Omega-3 Fatty Acids (FISH OIL PO) Take 1 capsule by mouth daily.  . polyethylene glycol (MIRALAX / GLYCOLAX) packet Take 17 g by mouth daily as needed for mild constipation.  Marland Kitchen rOPINIRole (REQUIP) 0.5 MG tablet Take by mouth. Take 1mg   in the evening and 1.5mg  at bedtime  . tamsulosin (FLOMAX) 0.4 MG CAPS capsule Take 0.4 mg by mouth at bedtime.   . [DISCONTINUED] gabapentin (NEURONTIN) 100 MG capsule Take 200 mg by mouth daily after supper.   . [DISCONTINUED] gabapentin (NEURONTIN) 100 MG capsule Take 2 capsules (200 mg total) by mouth daily after supper.  . carbidopa-levodopa (SINEMET CR) 50-200 MG tablet Take 1 tablet by mouth at bedtime.   No facility-administered encounter medications on file as of 09/26/2016.     PAST MEDICAL HISTORY:   Past Medical History:  Diagnosis Date  . Bacteria in urine 07/16/2016  . COPD (chronic obstructive  pulmonary disease) (HCC)   . Hyperlipidemia   . Hypertension   . Hypothyroidism   . Neurogenic bladder   . 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 Parkinson's disease (HCC)   . 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 APPENDECTOMY    . JOINT REPLACEMENT    . 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 Marital status: Married    Spouse name: N/A  . Number of children: N/A  . Years of education: N/A   Occupational History  . Not on file.   Social History Main Topics  . Smoking status: Former Smoker    Packs/day: 0.50    Years: 20.00    Types: Cigarettes    Quit date: 04/20/1996  . Smokeless tobacco: Never Used  . Alcohol use No  . Drug use: No  . Sexual activity: No   Other Topics Concern  . Not on file   Social History Narrative  . No narrative on file    FAMILY HISTORY:   Family Status  Relation Status  . Mother Deceased  . Father Deceased  . Brother Deceased  . Child Alive    ROS:  A complete 10 system review of systems was obtained and was unremarkable apart from what is mentioned above.  PHYSICAL EXAMINATION:    VITALS:   Vitals:   09/26/16 1535  BP: 138/88  Pulse: 68  SpO2: 98%    GEN:  The patient appears stated age and is in NAD. HEENT:  Normocephalic, atraumatic.  The mucous membranes are moist. The superficial temporal arteries are without ropiness or tenderness.  Drooling is noted. CV:  RRR Lungs:  CTAB Neck/HEME:  There are no carotid bruits bilaterally.  Neurological examination:  Orientation:  Montreal Cognitive Assessment  07/17/2016  Visuospatial/ Executive (0/5) 2  Naming (0/3) 2  Attention: Read list of digits (0/2) 1  Attention: Read list of letters (0/1) 1  Attention: Serial 7 subtraction starting at 100 (0/3) 3  Language: Repeat phrase (0/2) 0  Language : Fluency (0/1) 0  Abstraction  (0/2) 0  Delayed Recall (0/5) 0  Orientation (0/6) 6  Total 15  Adjusted Score (based on education) 15   Cranial nerves: There is good facial symmetry.  The speech is pseudobulbar in quality. Soft palate rises symmetrically and there is no tongue deviation. Hearing is intact to conversational tone. Sensation: Sensation is intact to light and pinprick throughout (facial, trunk, extremities). Vibration is intact at the bilateral big toe. There is no extinction with double simultaneous stimulation. There is no sensory dermatomal level identified. Motor: Strength is 5/5 in the bilateral upper and lower extremities.   Shoulder shrug is equal and symmetric.  There  is no pronator drift.  Movement examination: Tone: There is mild increased tone in the RUE.  Part of this is gegenhalten. Abnormal movements:no dyskinesia. Coordination:  There is mild decremation with RAM's, seen with hand opening and closing, finger taps, alternation of supination but mostly with heel and toe taps Gait and Station: The patient has mild difficulty arising out of a deep-seated chair without the use of the hands and instead pushes off the [facility] and has mild assist from examiner. The patient's stride length is just slightly decreased with the walker.  ASSESSMENT/PLAN:  1.  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   -change carbidopa/levodopa 25/100 to 1.5 5 times per day and then take 2 full tablets at the 6 pm dosage  -wean off of requip because of OH and memory change.  -add carbidopa/levodopa 50/200 for RLS sx's and for cramping of feet and legs.  Would rather use this than requip  -discussed methods to help with freezing (big steps, stopping before moving, marching, etc)  2.  Orthostatic hypotension  (refused to allow actually checking this today)  -now off of BP meds  -talked about proper hydration  -need to wean off of requip.  Given slow weaning schedule.  Was on for RLS  -continue to monitor.  BP good in office today.  He may be one of patients we have to give BP parameters to family for (tx prn for both highs and lows with different meds).  Discussed with son in law today  3.  Follow up is anticipated in the next few months, sooner should new neurologic issues arise.  Much greater than 50% of this visit was spent in counseling and coordinating care.  Total face to face time:  40 min    Cc:  Cheron SchaumannVelazquez, Gretchen Y., MD

## 2016-09-26 ENCOUNTER — Telehealth: Payer: Self-pay | Admitting: Neurology

## 2016-09-26 ENCOUNTER — Encounter: Payer: Self-pay | Admitting: Neurology

## 2016-09-26 ENCOUNTER — Ambulatory Visit (INDEPENDENT_AMBULATORY_CARE_PROVIDER_SITE_OTHER): Payer: Medicare Other | Admitting: Neurology

## 2016-09-26 VITALS — BP 138/88 | HR 68

## 2016-09-26 DIAGNOSIS — G2581 Restless legs syndrome: Secondary | ICD-10-CM

## 2016-09-26 DIAGNOSIS — G903 Multi-system degeneration of the autonomic nervous system: Secondary | ICD-10-CM

## 2016-09-26 DIAGNOSIS — G20 Parkinson's disease: Secondary | ICD-10-CM | POA: Diagnosis not present

## 2016-09-26 MED ORDER — GABAPENTIN 100 MG PO CAPS: 200.0000 mg | ORAL_CAPSULE | Freq: Every day | ORAL | 1 refills | Status: DC

## 2016-09-26 MED ORDER — CARBIDOPA-LEVODOPA ER 50-200 MG PO TBCR: 1.0000 | EXTENDED_RELEASE_TABLET | Freq: Every day | ORAL | 3 refills | Status: DC

## 2016-09-26 MED ORDER — GABAPENTIN 100 MG PO CAPS: 200.0000 mg | ORAL_CAPSULE | Freq: Every day | ORAL | 5 refills | Status: DC

## 2016-09-26 NOTE — Telephone Encounter (Signed)
Patient has not been holding Levodopa all day. Per Dr. Arbutus Leasat okay to take as normal since has not been off it for the day. Will keep appt.

## 2016-09-26 NOTE — Telephone Encounter (Signed)
Patient daughter needs to know if she needs to hold the medication before coming in today at 3:15 please call her at [phone]

## 2016-09-26 NOTE — Patient Instructions (Addendum)
1. Increase Carbidopa Levodopa to 1.5 tablets 5 times daily and 2 tablets at 6 pm.  Start Carbidopa Levodopa 50/200 CR thirty minutes prior to bedtime. Prescription has been sent to your pharmacy.    2. Decrease Requip as follows:  Take Requip 0.5 mg - 3 tablets at 6 pm (stop bedtime dosage) for one week Then Take Requip 0.5 mg - 2 tablets at 6 pm for one week Then Take Requip 0.5 mg - 1 tablet at 6 pm for one week Then stop Requip

## 2016-09-30 ENCOUNTER — Telehealth: Payer: Self-pay | Admitting: Neurology

## 2016-09-30 NOTE — Telephone Encounter (Signed)
Caller: Daughter  Urgent? No  Reason for the call: Would like to update you and Dr. Arbutus Leasat on his symptoms he has been having since the dosage was increased on his medication. Please call. Thanks

## 2016-10-01 ENCOUNTER — Encounter: Payer: Self-pay | Admitting: Neurology

## 2016-10-01 NOTE — Telephone Encounter (Signed)
i'm happy to alter that but suspect that he will need this increased dose and feel less dizzy once the requip is tapered off in the next few weeks.  If unable to tolerate, can lower the levodopa for now until requip tapered a bit more (1-2 weeks) and then try to go up again.  If unsuccessful, may have to change daytime to the CR version

## 2016-10-01 NOTE — Telephone Encounter (Signed)
Spoke with patient's daughter.  She states since patient increased Carbidopa Levodopa he has complained of constant dizziness. Before when he complained of dizziness it seemed to be related to his blood pressure and since increasing medication it is all the time. She states caregivers told her she looks "drugged" and can't get up to stand.  She also asked about constipation and she will sign up for mychart so I can send her information on Parkinson's and constipation.   Dr. Arbutus Leasat please advise.

## 2016-10-01 NOTE — Telephone Encounter (Signed)
Spoke with patient's daughter. She will hold increase of Levodopa until Requip has been d/c. Will call with any questions.

## 2016-10-30 ENCOUNTER — Telehealth: Payer: Self-pay | Admitting: Neurology

## 2016-10-30 NOTE — Telephone Encounter (Signed)
I need to know what symptoms

## 2016-10-30 NOTE — Telephone Encounter (Signed)
PT's daughter called in regards to his symptoms for parkinsons, they keep happening and with the increase of Sinemet it is not helping

## 2016-10-31 NOTE — Telephone Encounter (Signed)
Spoke with pt's daughter who states that pt is having "episodes" more often now.  He says that he has a pulling sensation in the back of his neck and top of his head, also feels "frozen" and just does not trust his body.  These episodes can last anywhere from 30 minutes to as long as 4 hours at a time.  They are not every day, however they are becoming more frequent, to the point where they are expecting them every day.  Pt has been off of Requip for 1.5 weeks now and is taking 1.5 tabs Sinemet every 2-2.5 hours.  Daughter does not see a point in upping Sinemet any further as the current dose is not making a difference.  Pt's daughter is acquainted with Dr. Margaretann Lovelessaju, who had mentioned Apokyn.  Pt daughter is curious if this could help pt with his symptoms.  Please advise

## 2016-10-31 NOTE — Telephone Encounter (Signed)
LMOM for daughter, relaying message below.

## 2016-10-31 NOTE — Telephone Encounter (Signed)
apokyn is not appropriate in this patient as it causes orthostatic hypotension and his has been quite severe. Duopa (the tube inserted into the stomach that is a 16 hour infusion of levodopa into the system) could be an option if they would like.  I think that they did not want to consider this.  Would his daughter like me to [facility]?

## 2016-11-06 ENCOUNTER — Telehealth: Payer: Self-pay | Admitting: Neurology

## 2016-11-06 NOTE — Telephone Encounter (Signed)
[facility] Dr. Zola ButtonHaq. 1st available is June 05, 2017 at 8:00am. They have him on cancellation list.   Dr. Arbutus Leasat - Is there an alternative? Not sure if patient's daughter will wait this long for appt.

## 2016-11-06 NOTE — Telephone Encounter (Signed)
Patient's daughter called regarding another medication that Dr. Arbutus Leasat was going to give. She also had a question regarding a referral to [facility]. She said she spoke with Meagan last Friday. Please Advise. Thanks

## 2016-11-06 NOTE — Telephone Encounter (Signed)
Spoke with patient's daughter who asked some questions about Duopa. They would like [facility] for [facility] opinion at this time.

## 2016-11-07 NOTE — Telephone Encounter (Signed)
I have sent patients note along with a message to Dr. Zola Button to make him aware of patient and situation.  [place] in [place] so [facility].

## 2016-11-09 ENCOUNTER — Inpatient Hospital Stay (HOSPITAL_COMMUNITY)
Admission: EM | Admit: 2016-11-09 | Discharge: 2016-11-11 | DRG: 699 | Disposition: A | Discharge status: Home / Self Care | Payer: Medicare Other | Attending: Internal Medicine | Admitting: Internal Medicine

## 2016-11-09 ENCOUNTER — Emergency Department (HOSPITAL_COMMUNITY): Payer: Medicare Other

## 2016-11-09 ENCOUNTER — Encounter (HOSPITAL_COMMUNITY): Payer: Self-pay | Admitting: Emergency Medicine

## 2016-11-09 DIAGNOSIS — J449 Chronic obstructive pulmonary disease, unspecified: Secondary | ICD-10-CM | POA: Diagnosis present

## 2016-11-09 DIAGNOSIS — Z87440 Personal history of urinary (tract) infections: Secondary | ICD-10-CM

## 2016-11-09 DIAGNOSIS — T83518A Infection and inflammatory reaction due to other urinary catheter, initial encounter: Secondary | ICD-10-CM | POA: Diagnosis not present

## 2016-11-09 DIAGNOSIS — I10 Essential (primary) hypertension: Secondary | ICD-10-CM | POA: Diagnosis present

## 2016-11-09 DIAGNOSIS — Z8669 Personal history of other diseases of the nervous system and sense organs: Secondary | ICD-10-CM

## 2016-11-09 DIAGNOSIS — D509 Iron deficiency anemia, unspecified: Secondary | ICD-10-CM | POA: Diagnosis present

## 2016-11-09 DIAGNOSIS — E785 Hyperlipidemia, unspecified: Secondary | ICD-10-CM | POA: Diagnosis present

## 2016-11-09 DIAGNOSIS — E039 Hypothyroidism, unspecified: Secondary | ICD-10-CM | POA: Diagnosis present

## 2016-11-09 DIAGNOSIS — G8929 Other chronic pain: Secondary | ICD-10-CM | POA: Diagnosis present

## 2016-11-09 DIAGNOSIS — Z8249 Family history of ischemic heart disease and other diseases of the circulatory system: Secondary | ICD-10-CM

## 2016-11-09 DIAGNOSIS — G20 Parkinson's disease: Secondary | ICD-10-CM | POA: Diagnosis not present

## 2016-11-09 DIAGNOSIS — D500 Iron deficiency anemia secondary to blood loss (chronic): Secondary | ICD-10-CM

## 2016-11-09 DIAGNOSIS — R509 Fever, unspecified: Secondary | ICD-10-CM | POA: Diagnosis not present

## 2016-11-09 DIAGNOSIS — F39 Unspecified mood [affective] disorder: Secondary | ICD-10-CM | POA: Diagnosis present

## 2016-11-09 DIAGNOSIS — Z96653 Presence of artificial knee joint, bilateral: Secondary | ICD-10-CM | POA: Diagnosis present

## 2016-11-09 DIAGNOSIS — N390 Urinary tract infection, site not specified: Secondary | ICD-10-CM | POA: Diagnosis present

## 2016-11-09 DIAGNOSIS — Z7982 Long term (current) use of aspirin: Secondary | ICD-10-CM

## 2016-11-09 DIAGNOSIS — N319 Neuromuscular dysfunction of bladder, unspecified: Secondary | ICD-10-CM | POA: Diagnosis present

## 2016-11-09 DIAGNOSIS — Y846 Urinary catheterization as the cause of abnormal reaction of the patient, or of later complication, without mention of misadventure at the time of the procedure: Secondary | ICD-10-CM | POA: Diagnosis present

## 2016-11-09 DIAGNOSIS — Z87891 Personal history of nicotine dependence: Secondary | ICD-10-CM

## 2016-11-09 DIAGNOSIS — N179 Acute kidney failure, unspecified: Secondary | ICD-10-CM | POA: Diagnosis present

## 2016-11-09 DIAGNOSIS — T83511A Infection and inflammatory reaction due to indwelling urethral catheter, initial encounter: Secondary | ICD-10-CM

## 2016-11-09 LAB — URINALYSIS, ROUTINE W REFLEX MICROSCOPIC
BACTERIA UA: NONE SEEN
Bilirubin Urine: NEGATIVE
KETONES UR: 5 mg/dL — AB
Nitrite: POSITIVE — AB
Protein, ur: 30 mg/dL — AB
SQUAMOUS EPITHELIAL / LPF: NONE SEEN
Specific Gravity, Urine: 1.014 (ref 1.005–1.030)
pH: 5 (ref 5.0–8.0)

## 2016-11-09 LAB — CBC WITH DIFFERENTIAL/PLATELET
BASOS ABS: 0 10*3/uL (ref 0.0–0.1)
Basophils Relative: 0 %
Eosinophils Absolute: 0.1 10*3/uL (ref 0.0–0.7)
Eosinophils Relative: 1 %
HEMATOCRIT: 30 % — AB (ref 39.0–52.0)
Hemoglobin: 10.3 g/dL — ABNORMAL LOW (ref 13.0–17.0)
LYMPHS PCT: 6 %
Lymphs Abs: 0.7 10*3/uL (ref 0.7–4.0)
MCH: 30 pg (ref 26.0–34.0)
MCHC: 34.3 g/dL (ref 30.0–36.0)
MCV: 87.5 fL (ref 78.0–100.0)
MONO ABS: 0.8 10*3/uL (ref 0.1–1.0)
MONOS PCT: 7 %
Neutro Abs: 9.7 10*3/uL — ABNORMAL HIGH (ref 1.7–7.7)
Neutrophils Relative %: 86 %
PLATELETS: 153 10*3/uL (ref 150–400)
RBC: 3.43 MIL/uL — ABNORMAL LOW (ref 4.22–5.81)
RDW: 13.5 % (ref 11.5–15.5)
WBC: 11.3 10*3/uL — ABNORMAL HIGH (ref 4.0–10.5)

## 2016-11-09 LAB — COMPREHENSIVE METABOLIC PANEL
ALBUMIN: 4 g/dL (ref 3.5–5.0)
ALT: <5 U/L — ABNORMAL LOW (ref 17–63)
AST: 19 U/L (ref 15–41)
Alkaline Phosphatase: 56 U/L (ref 38–126)
Anion gap: 7 (ref 5–15)
BUN: 27 mg/dL — AB (ref 6–20)
CALCIUM: 9.7 mg/dL (ref 8.9–10.3)
CO2: 28 mmol/L (ref 22–32)
Chloride: 103 mmol/L (ref 101–111)
Creatinine, Ser: 1.21 mg/dL (ref 0.61–1.24)
GFR calc Af Amer: >60 mL/min (ref >60)
GFR calc non Af Amer: 56 mL/min — ABNORMAL LOW (ref >60)
GLUCOSE: 140 mg/dL — AB (ref 65–99)
POTASSIUM: 4.4 mmol/L (ref 3.5–5.1)
SODIUM: 138 mmol/L (ref 135–145)
TOTAL PROTEIN: 7.3 g/dL (ref 6.5–8.1)
Total Bilirubin: 0.6 mg/dL (ref 0.3–1.2)

## 2016-11-09 LAB — CG4 I-STAT (LACTIC ACID): LACTIC ACID, VENOUS: 1.77 mmol/L (ref 0.5–1.9)

## 2016-11-09 MED ORDER — DEXTROSE 5 % IV SOLN
1.0000 g | Freq: Two times a day (BID) | INTRAVENOUS | Status: DC
  Administered 2016-11-10 – 2016-11-11 (×4): 1 g via INTRAVENOUS
  Filled 2016-11-09 (×6): qty 1

## 2016-11-09 MED ORDER — SODIUM CHLORIDE 0.9 % IV BOLUS (SEPSIS)
1000.0000 mL | Freq: Once | INTRAVENOUS | Status: AC
  Administered 2016-11-09: 1000 mL via INTRAVENOUS

## 2016-11-09 MED ORDER — ONDANSETRON HCL 4 MG PO TABS: 4.0000 mg | ORAL_TABLET | Freq: Four times a day (QID) | ORAL | Status: DC | PRN

## 2016-11-09 MED ORDER — CARBIDOPA-LEVODOPA 25-100 MG PO TABS
1.5000 | ORAL_TABLET | Freq: Every day | ORAL | Status: DC
  Administered 2016-11-10 – 2016-11-11 (×10): 1.5 via ORAL
  Filled 2016-11-09 (×10): qty 2

## 2016-11-09 MED ORDER — ACETAMINOPHEN 650 MG RE SUPP: 650.0000 mg | Freq: Four times a day (QID) | RECTAL | Status: DC | PRN

## 2016-11-09 MED ORDER — CARBIDOPA-LEVODOPA ER 50-200 MG PO TBCR
1.0000 | EXTENDED_RELEASE_TABLET | Freq: Every day | ORAL | Status: DC
  Administered 2016-11-10: 1 via ORAL
  Filled 2016-11-09 (×2): qty 1

## 2016-11-09 MED ORDER — SENNOSIDES-DOCUSATE SODIUM 8.6-50 MG PO TABS: 1.0000 | ORAL_TABLET | Freq: Every evening | ORAL | Status: DC | PRN

## 2016-11-09 MED ORDER — OMEGA-3-ACID ETHYL ESTERS 1 G PO CAPS
1.0000 g | ORAL_CAPSULE | Freq: Every day | ORAL | Status: DC
  Administered 2016-11-10 – 2016-11-11 (×2): 1 g via ORAL
  Filled 2016-11-09 (×2): qty 1

## 2016-11-09 MED ORDER — TAMSULOSIN HCL 0.4 MG PO CAPS
0.4000 mg | ORAL_CAPSULE | Freq: Every day | ORAL | Status: DC
  Administered 2016-11-10: 0.4 mg via ORAL
  Filled 2016-11-09: qty 1

## 2016-11-09 MED ORDER — ACETAMINOPHEN 325 MG PO TABS: 650.0000 mg | ORAL_TABLET | Freq: Four times a day (QID) | ORAL | Status: DC | PRN

## 2016-11-09 MED ORDER — CITALOPRAM HYDROBROMIDE 20 MG PO TABS
10.0000 mg | ORAL_TABLET | Freq: Every day | ORAL | Status: DC
  Administered 2016-11-10: 10 mg via ORAL
  Filled 2016-11-09: qty 1

## 2016-11-09 MED ORDER — DOCUSATE SODIUM 100 MG PO CAPS
100.0000 mg | ORAL_CAPSULE | Freq: Every day | ORAL | Status: DC
  Administered 2016-11-10 – 2016-11-11 (×2): 100 mg via ORAL
  Filled 2016-11-09 (×2): qty 1

## 2016-11-09 MED ORDER — BISACODYL 5 MG PO TBEC
5.0000 mg | DELAYED_RELEASE_TABLET | Freq: Every day | ORAL | Status: DC | PRN
  Administered 2016-11-10: 5 mg via ORAL
  Filled 2016-11-09: qty 1

## 2016-11-09 MED ORDER — ASPIRIN EC 81 MG PO TBEC
81.0000 mg | DELAYED_RELEASE_TABLET | Freq: Every day | ORAL | Status: DC
  Administered 2016-11-10 – 2016-11-11 (×2): 81 mg via ORAL
  Filled 2016-11-09 (×2): qty 1

## 2016-11-09 MED ORDER — HEPARIN SODIUM (PORCINE) 5000 UNIT/ML IJ SOLN
5000.0000 [IU] | Freq: Three times a day (TID) | INTRAMUSCULAR | Status: DC
  Administered 2016-11-10 – 2016-11-11 (×6): 5000 [IU] via SUBCUTANEOUS
  Filled 2016-11-09 (×6): qty 1

## 2016-11-09 MED ORDER — ALBUTEROL SULFATE (2.5 MG/3ML) 0.083% IN NEBU: 2.5000 mg | INHALATION_SOLUTION | Freq: Four times a day (QID) | RESPIRATORY_TRACT | Status: DC | PRN

## 2016-11-09 MED ORDER — SODIUM CHLORIDE 0.9 % IV SOLN
INTRAVENOUS | Status: AC
  Administered 2016-11-10: via INTRAVENOUS

## 2016-11-09 MED ORDER — FERROUS SULFATE 325 (65 FE) MG PO TABS
325.0000 mg | ORAL_TABLET | Freq: Every day | ORAL | Status: DC
  Administered 2016-11-10 – 2016-11-11 (×2): 325 mg via ORAL
  Filled 2016-11-09 (×2): qty 1

## 2016-11-09 MED ORDER — ALBUTEROL SULFATE HFA 108 (90 BASE) MCG/ACT IN AERS: 2.0000 | INHALATION_SPRAY | Freq: Four times a day (QID) | RESPIRATORY_TRACT | Status: DC | PRN

## 2016-11-09 MED ORDER — ATORVASTATIN CALCIUM 20 MG PO TABS
20.0000 mg | ORAL_TABLET | Freq: Every day | ORAL | Status: DC
  Administered 2016-11-10: 20 mg via ORAL
  Filled 2016-11-09: qty 1

## 2016-11-09 MED ORDER — HYDROCODONE-ACETAMINOPHEN 5-325 MG PO TABS
1.0000 | ORAL_TABLET | ORAL | Status: DC | PRN
  Administered 2016-11-10: 1 via ORAL
  Filled 2016-11-09: qty 1

## 2016-11-09 MED ORDER — ONDANSETRON HCL 4 MG/2ML IJ SOLN: 4.0000 mg | Freq: Four times a day (QID) | INTRAMUSCULAR | Status: DC | PRN

## 2016-11-09 MED ORDER — GABAPENTIN 100 MG PO CAPS
200.0000 mg | ORAL_CAPSULE | Freq: Every day | ORAL | Status: DC
  Administered 2016-11-10: 200 mg via ORAL
  Filled 2016-11-09: qty 2

## 2016-11-09 MED ORDER — LEVOTHYROXINE SODIUM 88 MCG PO TABS
88.0000 ug | ORAL_TABLET | Freq: Every day | ORAL | Status: DC
  Administered 2016-11-10 – 2016-11-11 (×2): 88 ug via ORAL
  Filled 2016-11-09 (×2): qty 1

## 2016-11-09 MED ORDER — PANTOPRAZOLE SODIUM 40 MG PO TBEC
40.0000 mg | DELAYED_RELEASE_TABLET | Freq: Every day | ORAL | Status: DC
  Administered 2016-11-10 – 2016-11-11 (×2): 40 mg via ORAL
  Filled 2016-11-09 (×2): qty 1

## 2016-11-09 MED ORDER — ACETAMINOPHEN 325 MG PO TABS
650.0000 mg | ORAL_TABLET | Freq: Once | ORAL | Status: AC | PRN
  Administered 2016-11-09: 650 mg via ORAL
  Filled 2016-11-09: qty 2

## 2016-11-09 NOTE — ED Notes (Signed)
Bed: WA24 Expected date:  Expected time:  Means of arrival:  Comments: Triage 2 

## 2016-11-09 NOTE — ED Triage Notes (Signed)
Pt comes in with complaints of fever that started today.  Endorses chills. Highest fever at home was 101.6 temporal.  Febrile on arrival. Denies being around anyone sick.  Frequent UTI's. Pt just finished augmentin about 2 weeks ago.  Pt I&O caths at home. Family reports urine being dark in color.

## 2016-11-09 NOTE — ED Provider Notes (Signed)
[facility]-EMERGENCY DEPT Provider Note   CSN: [id number] Arrival date & time: 11/09/16  1844     History   Chief Complaint No chief complaint on file.   HPI Bryan Vega is a 76 y.o. male.  Patient is a 76 year old male with past medical history of Parkinson's disease presenting with complaints of fever. He has not felt well all day. He performs home cath and is prone to UTIs. The family is concerned that maybe what is occurring. He denies cough, vomiting, diarrhea, or other complaints that would explain his fever.   The history is provided by the patient.  Fever   This is a new problem. Episode onset: Today. The problem occurs constantly. The problem has been gradually worsening. Pertinent negatives include no chest pain. He has tried nothing for the symptoms.    Past Medical History:  Diagnosis Date  . Bacteria in urine 07/16/2016  . COPD (chronic obstructive pulmonary disease) (HCC)   . Hyperlipidemia   . Hypertension   . Hypothyroidism   . Neurogenic bladder   . 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 Parkinson's disease (HCC)   . 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 Acute lower UTI 08/08/2016  . 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 Bacteria in urine 07/16/2016  . 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 Pyelonephritis, acute 06/24/2016  . 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 Hyponatremia 01/21/2016  . Anemia 01/21/2016  . Infection due to urethral catheter (HCC)   . Sepsis secondary to UTI (HCC) 01/20/2016  . Hyperlipidemia 01/20/2016  . Hypothyroidism 01/20/2016  . Hypotension 12/20/2015  . Lower urinary tract infectious disease 12/20/2015  . Generalized weakness 12/20/2015  . Parkinson disease (HCC) 12/20/2015  . Neurogenic bladder 12/20/2015    Past Surgical History:  Procedure Laterality Date  . APPENDECTOMY    . JOINT REPLACEMENT    . 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 Authorizing Provider  acetaminophen (TYLENOL) 500 MG tablet Take 1,000 mg by mouth every 6 (six) hours as needed for fever.    [provider]  aspirin EC 81 MG tablet Take 81 mg by mouth daily.    [provider]  atorvastatin (LIPITOR) 20 MG tablet Take 20 mg by mouth at bedtime.     [provider]  CALCIUM PO Take 1 tablet by mouth daily.    [provider]  carbidopa-levodopa (SINEMET CR) 50-200 MG tablet Take 1 tablet by mouth at bedtime. 09/26/16   Tat, Octaviano Batty, DO  carbidopa-levodopa (SINEMET IR) 25-100 MG tablet 1.5/1/1.5/1/1.5/1    [provider]  cephALEXin (KEFLEX) 500 MG capsule Take 1 capsule (500 mg total) by mouth every 12 (twelve) hours. 08/11/16   Calvert Cantor, MD  citalopram (CELEXA) 20 MG tablet Take 0.5 tablets (10 mg total) by mouth at bedtime. 06/26/16   Lenox Ponds, MD  docusate sodium (COLACE) 100 MG capsule Take 100 mg by mouth daily.     [provider]  ferrous sulfate 325 (65 FE) MG tablet Take 325 mg by mouth daily with breakfast.    [provider]  gabapentin (NEURONTIN) 100 MG capsule Take 2 capsules (200 mg total) by mouth daily after supper. 09/26/16   Tat, Octaviano Batty, DO  levothyroxine (SYNTHROID, LEVOTHROID) 88 MCG tablet Take 88 mcg by mouth daily before breakfast.  [provider]  Omega-3 Fatty Acids (FISH OIL PO) Take 1 capsule by mouth daily.    [provider]  polyethylene glycol (MIRALAX / GLYCOLAX) packet Take 17 g by mouth daily as needed for mild constipation. 08/11/16   Calvert Cantor, MD  rOPINIRole (REQUIP) 0.5 MG tablet Take by mouth. Take 1mg  in the evening and 1.5mg  at bedtime    [provider]  tamsulosin (FLOMAX) 0.4 MG CAPS capsule Take 0.4 mg by mouth at bedtime.     [provider]    Family History Family History  Problem Relation Age of Onset  . Heart  attack Brother     Social History Social History  Substance Use Topics  . Smoking status: Former Smoker    Packs/day: 0.50    Years: 20.00    Types: Cigarettes    Quit date: 04/20/1996  . Smokeless tobacco: Never Used  . Alcohol use No     Allergies   Patient has no known allergies.   Review of Systems Review of Systems  Constitutional: Positive for fever.  Cardiovascular: Negative for chest pain.  All other systems reviewed and are negative.    Physical Exam Updated Vital Signs BP 119/62 (BP Location: Right Arm)   Pulse (!) 102   Temp (!) 100.4 F (38 C) (Oral)   Resp 18   Wt 76.2 kg (168 lb)   SpO2 99%   BMI 25.54 kg/m   Physical Exam  Constitutional: He is oriented to person, place, and time. No distress.  Patient is a chronically ill-appearing 76 year old male. He is in no acute distress.  HENT:  Head: Normocephalic and atraumatic.  Mouth/Throat: Oropharynx is clear and moist.  Eyes: Pupils are equal, round, and reactive to light. EOM are normal.  Neck: Normal range of motion. Neck supple.  Cardiovascular: Normal rate and regular rhythm.  Exam reveals no friction rub.   No murmur heard. Pulmonary/Chest: Effort normal and breath sounds normal. No respiratory distress. He has no wheezes. He has no rales.  Abdominal: Soft. Bowel sounds are normal. He exhibits no distension. There is no tenderness.  Musculoskeletal: Normal range of motion. He exhibits no edema.  Neurological: He is alert and oriented to person, place, and time.  Rigidity and tremor are noted.  Skin: Skin is warm and dry. He is not diaphoretic.  Nursing note and vitals reviewed.    ED Treatments / Results  Labs (all labs ordered are listed, but only abnormal results are displayed) Labs Reviewed  COMPREHENSIVE METABOLIC PANEL - Abnormal; Notable for the following:       Result Value   Glucose, Bld 140 (*)    BUN 27 (*)    ALT <5 (*)    GFR calc non Af Amer 56 (*)    All other  components within normal limits  CBC WITH DIFFERENTIAL/PLATELET - Abnormal; Notable for the following:    WBC 11.3 (*)    RBC 3.43 (*)    Hemoglobin 10.3 (*)    HCT 30.0 (*)    Neutro Abs 9.7 (*)    All other components within normal limits  CULTURE, BLOOD (ROUTINE X 2)  CULTURE, BLOOD (ROUTINE X 2)  URINALYSIS, ROUTINE W REFLEX MICROSCOPIC  I-STAT CG4 LACTIC ACID, ED  CG4 I-STAT (LACTIC ACID)    EKG  EKG Interpretation None       Radiology Dg Chest 2 View  Result Date: 11/09/2016 CLINICAL DATA:  Fevers EXAM: CHEST  2 VIEW COMPARISON:  08/08/2016  FINDINGS: The heart size and mediastinal contours are within normal limits. Both lungs are clear. The visualized skeletal structures are unremarkable. IMPRESSION: No active cardiopulmonary disease. Electronically Signed   By: Alcide Clever M.D.   On: 11/09/2016 19:44    Procedures Procedures (including critical care time)  Medications Ordered in ED Medications  sodium chloride 0.9 % bolus 1,000 mL (not administered)  acetaminophen (TYLENOL) tablet 650 mg (650 mg Oral Given 11/09/16 1905)     Initial Impression / Assessment and Plan / ED Course  I have reviewed the triage vital signs and the nursing notes.  Pertinent labs & imaging results that were available during my care of the patient were reviewed by me and considered in my medical decision making (see chart for details).  Patient with history of Parkinson's disease performing self caths at home. He presents for evaluation of fever. He does have what appears to be a significant UTI. He has a low-grade fever and elevated white count. He has been much weaker for the past 2 days and the family is concerned about his return home. I discussed the case with Dr. Antionette Char who agrees to admit.  Final Clinical Impressions(s) / ED Diagnoses   Final diagnoses:  None    New Prescriptions New Prescriptions   No medications on file     Geoffery Lyons, MD 11/09/16 2252

## 2016-11-09 NOTE — ED Notes (Signed)
ED Provider at bedside. 

## 2016-11-09 NOTE — H&P (Signed)
History and Physical    Bryan Vega HCW:[id number] DOB: Oct 19, 1940 DOA: 11/09/2016  PCP: Cheron Schaumann., MD   Patient coming from: Home  Chief Complaint: Fevers, chills   HPI: Bryan Vega is a 76 y.o. male with medical history significant for Parkinson's disease, iron deficiency anemia, hypothyroidism, COPD, and recurrent UTI, now presenting to the emergency department for evaluation of fevers, chills, and lethargy. Patient was reportedly in his usual state of health until 2 days ago when he noted the development of mild chills and fatigue. Yesterday he experienced worsening in his chills, was noted to be febrile, and slept most of the day. He typically ambulates with a walker and is able to attend to his basic ADLs, but has been unable to do this for the past day due to his lethargy. He has recurrent UTI and completed a course of Augmentin 2 weeks ago. Denies any flank pain, denies chest pain or cough, and denies headache, change in vision or hearing, or focal numbness or weakness.      ED Course: Upon arrival to the ED, patient is found to be febrile to 38 C, saturating 99% on room air, slightly tachycardic, and with vitals otherwise stable. Chest x-ray is negative for acute cardiopulmonary disease, chemistry panel is notable for a 1 of 27 and creatinine 1.21, and CBC features a leukocytosis to 11,300 and a stable normocytic anemia with hemoglobin of 10.3. Lactic acid is reassuring at 1.77 and urinalysis is suggestive of infection. Blood cultures were collected in the ED and the patient was treated with 1 L of normal saline and acetaminophen. He remained hemodynamically stable and in no apparent respiratory distress, and he will be observed on the medical-surgical unit for ongoing evaluation and management of fevers, chills, lethargy, suspected secondary to recurrent UTI.   Review of Systems:  All other systems reviewed and apart from HPI, are negative.  Past Medical History:    Diagnosis Date  . Bacteria in urine 07/16/2016  . COPD (chronic obstructive pulmonary disease) (HCC)   . Hyperlipidemia   . Hypertension   . Hypothyroidism   . Neurogenic bladder   . 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 Parkinson's disease (HCC)   . 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 APPENDECTOMY    . JOINT REPLACEMENT    . REPLACEMENT TOTAL KNEE BILATERAL Bilateral 2007 & 2012     reports that he quit smoking about 20 years ago. His smoking use included Cigarettes. He has a 10.00 pack-year smoking history. He has never used smokeless tobacco. He reports that he does not drink alcohol or use drugs.  No Known Allergies  Family History  Problem Relation Age of Onset  . Heart attack Brother      Prior to Admission medications   Medication Sig Start Date End Date Taking? Authorizing Provider  albuterol (PROVENTIL HFA;VENTOLIN HFA) 108 (90 Base) MCG/ACT inhaler Inhale 2 puffs into the lungs 4 (four) times daily as needed for wheezing or shortness of breath. 04/01/16 04/01/17 Yes [provider]  aspirin EC 81 MG tablet Take 81 mg by mouth daily.   Yes [provider]  atorvastatin (LIPITOR) 20 MG tablet Take 20 mg by mouth at bedtime.    Yes [provider]  CALCIUM PO Take 1 tablet by mouth daily.   Yes [provider]  carbidopa-levodopa (SINEMET CR) 50-200 MG tablet Take 1 tablet by mouth at bedtime. 09/26/16  Yes Tat, Octaviano Batty, DO  carbidopa-levodopa (SINEMET IR) 25-100 MG tablet Take 1.5 tablets by mouth 6 (six) times daily. 1.5/1/1.5/1/1.5/1   Yes [provider]  citalopram (CELEXA) 20 MG tablet Take 0.5 tablets (10 mg total) by mouth at bedtime. 06/26/16  Yes Randel Pigg, Dorma Russell, MD  docusate sodium (COLACE) 100 MG capsule Take 100 mg by mouth daily.    Yes [provider]  ferrous sulfate 325 (65 FE) MG tablet Take 325 mg by mouth daily with breakfast.   Yes [provider]  gabapentin (NEURONTIN) 100 MG capsule Take 2 capsules (200 mg total) by mouth daily after supper. 09/26/16  Yes Tat, Octaviano Batty, DO  levothyroxine (SYNTHROID, LEVOTHROID) 88 MCG tablet Take 88 mcg by mouth daily before breakfast.   Yes [provider]  Omega-3 Fatty Acids (FISH OIL PO) Take 1 capsule by mouth daily.   Yes [provider]  omeprazole (PRILOSEC) 20 MG capsule Take 20 mg by mouth daily.   Yes [provider]  tamsulosin (FLOMAX) 0.4 MG CAPS capsule Take 0.4 mg by mouth at bedtime.    Yes [provider]  cephALEXin (KEFLEX) 500 MG capsule Take 1 capsule (500 mg total) by mouth every 12 (twelve) hours. Patient not taking: Reported on 11/09/2016 08/11/16   Calvert Cantor, MD  polyethylene glycol (MIRALAX / GLYCOLAX) packet Take 17 g by mouth daily as needed for mild constipation. Patient not taking: Reported on 11/09/2016 08/11/16   Calvert Cantor, MD    Physical Exam: Vitals:   11/09/16 1854 11/09/16 1900 11/09/16 2204  BP: 119/62  (!) 105/55  Pulse: (!) 102  63  Resp: 18  16  Temp: (!) 100.4 F (38 C)  98 F (36.7 C)  TempSrc: Oral  Oral  SpO2: 99%  99%  Weight:  76.2 kg (168 lb)       Constitutional: NAD, calm, in apparent discomfort, shivering  Eyes: PERTLA, lids and conjunctivae normal ENMT: Mucous membranes are moist. Posterior pharynx clear of any exudate or lesions.   Neck: normal, supple, no masses, no thyromegaly Respiratory: clear to auscultation bilaterally, expiratory wheeze, no crackles. Normal respiratory effort.   Cardiovascular: S1 & S2 heard, regular rate and rhythm. Trace pretibial edema bilaterally. No significant JVD. Abdomen: No distension, no tenderness, no masses palpated. Bowel sounds active.  Musculoskeletal: no clubbing / cyanosis. No joint deformity upper and lower extremities.   Skin: no significant rashes, lesions, ulcers. Warm, dry, well-perfused. Neurologic: CN 2-12 grossly intact. Sensation intact, DTR  normal. Strength 5/5 in all 4 limbs.  Psychiatric: Alert and oriented x 3. Pleasant and cooperative.     Labs on Admission: I have personally reviewed following labs and imaging studies  CBC:  Recent Labs Lab 11/09/16 1902  WBC 11.3*  NEUTROABS 9.7*  HGB 10.3*  HCT 30.0*  MCV 87.5  PLT 153   Basic Metabolic Panel:  Recent Labs Lab 11/09/16 1902  NA 138  K 4.4  CL 103  CO2 28  GLUCOSE 140*  BUN 27*  CREATININE 1.21  CALCIUM 9.7   GFR: Estimated Creatinine Clearance: 49.5 mL/min (by C-G formula based on SCr of 1.21 mg/dL). Liver Function Tests:  Recent Labs Lab 11/09/16 1902  AST 19  ALT <5*  ALKPHOS 56  BILITOT 0.6  PROT 7.3  ALBUMIN 4.0   No results for input(s): LIPASE, AMYLASE in the last 168 hours. No results for input(s): AMMONIA in the last 168 hours. Coagulation Profile: No results for input(s): INR, PROTIME in the last 168  hours. Cardiac Enzymes: No results for input(s): CKTOTAL, CKMB, CKMBINDEX, TROPONINI in the last 168 hours. BNP (last 3 results) No results for input(s): PROBNP in the last 8760 hours. HbA1C: No results for input(s): HGBA1C in the last 72 hours. CBG: No results for input(s): GLUCAP in the last 168 hours. Lipid Profile: No results for input(s): CHOL, HDL, LDLCALC, TRIG, CHOLHDL, LDLDIRECT in the last 72 hours. Thyroid Function Tests: No results for input(s): TSH, T4TOTAL, FREET4, T3FREE, THYROIDAB in the last 72 hours. Anemia Panel: No results for input(s): VITAMINB12, FOLATE, FERRITIN, TIBC, IRON, RETICCTPCT in the last 72 hours. Urine analysis:    Component Value Date/Time   COLORURINE YELLOW 11/09/2016 1902   APPEARANCEUR CLOUDY (A) 11/09/2016 1902   LABSPEC 1.014 11/09/2016 1902   PHURINE 5.0 11/09/2016 1902   GLUCOSEU >=500 (A) 11/09/2016 1902   HGBUR SMALL (A) 11/09/2016 1902   BILIRUBINUR NEGATIVE 11/09/2016 1902   KETONESUR 5 (A) 11/09/2016 1902   PROTEINUR 30 (A) 11/09/2016 1902   NITRITE POSITIVE (A)  11/09/2016 1902   LEUKOCYTESUR LARGE (A) 11/09/2016 1902   Sepsis Labs: @LABRCNTIP (procalcitonin:4,lacticidven:4) )No results found for this or any previous visit (from the past 240 hour(s)).   Radiological Exams on Admission: Dg Chest 2 View  Result Date: 11/09/2016 CLINICAL DATA:  Fevers EXAM: CHEST  2 VIEW COMPARISON:  08/08/2016 FINDINGS: The heart size and mediastinal contours are within normal limits. Both lungs are clear. The visualized skeletal structures are unremarkable. IMPRESSION: No active cardiopulmonary disease. Electronically Signed   By: Alcide Clever M.D.   On: 11/09/2016 19:44    EKG: Not performed.   Assessment/Plan  1. Recurrent UTI  - Pt presents with fevers, chills, lethargy  - Noted to be febrile on arrival with leukocytosis, clear CXR, and UA suggestive of recurrent infection  - Hx of pseudomonal UTI  - Send urine for culture and start empiric cefepime  - Continue supportive care with antipyretics, analgesia, IVF hydration    2. Parkinson disease  - Stable  - Continue Sinemet    3. Iron-deficiency anemia  - Hgb is 10.3 and stable on admission with no bleeding evident  - Continue iron-supplementation   4. Hypothyroidism  - Continue Synthroid     DVT prophylaxis: sq heparin  Code Status: Full  Family Communication: Family updated at bedside Disposition Plan: Observe on med-surg Consults called: None Admission status: Observation    Briscoe Deutscher, MD [facility] Pager [phone]  If 7PM-7AM, please contact night-coverage [email or web address] Password [email or web address]  11/09/2016, 10:18 PM

## 2016-11-10 DIAGNOSIS — Z87440 Personal history of urinary (tract) infections: Secondary | ICD-10-CM | POA: Diagnosis not present

## 2016-11-10 DIAGNOSIS — J449 Chronic obstructive pulmonary disease, unspecified: Secondary | ICD-10-CM | POA: Diagnosis present

## 2016-11-10 DIAGNOSIS — D509 Iron deficiency anemia, unspecified: Secondary | ICD-10-CM | POA: Diagnosis present

## 2016-11-10 DIAGNOSIS — Y846 Urinary catheterization as the cause of abnormal reaction of the patient, or of later complication, without mention of misadventure at the time of the procedure: Secondary | ICD-10-CM | POA: Diagnosis present

## 2016-11-10 DIAGNOSIS — Z87891 Personal history of nicotine dependence: Secondary | ICD-10-CM | POA: Diagnosis not present

## 2016-11-10 DIAGNOSIS — E039 Hypothyroidism, unspecified: Secondary | ICD-10-CM | POA: Diagnosis present

## 2016-11-10 DIAGNOSIS — Z96653 Presence of artificial knee joint, bilateral: Secondary | ICD-10-CM | POA: Diagnosis present

## 2016-11-10 DIAGNOSIS — E785 Hyperlipidemia, unspecified: Secondary | ICD-10-CM | POA: Diagnosis present

## 2016-11-10 DIAGNOSIS — N319 Neuromuscular dysfunction of bladder, unspecified: Secondary | ICD-10-CM | POA: Diagnosis present

## 2016-11-10 DIAGNOSIS — F39 Unspecified mood [affective] disorder: Secondary | ICD-10-CM | POA: Diagnosis present

## 2016-11-10 DIAGNOSIS — N390 Urinary tract infection, site not specified: Secondary | ICD-10-CM | POA: Diagnosis present

## 2016-11-10 DIAGNOSIS — T83518A Infection and inflammatory reaction due to other urinary catheter, initial encounter: Secondary | ICD-10-CM | POA: Diagnosis present

## 2016-11-10 DIAGNOSIS — N179 Acute kidney failure, unspecified: Secondary | ICD-10-CM | POA: Diagnosis present

## 2016-11-10 DIAGNOSIS — G8929 Other chronic pain: Secondary | ICD-10-CM | POA: Diagnosis present

## 2016-11-10 DIAGNOSIS — Z7982 Long term (current) use of aspirin: Secondary | ICD-10-CM | POA: Diagnosis not present

## 2016-11-10 DIAGNOSIS — R509 Fever, unspecified: Secondary | ICD-10-CM | POA: Diagnosis present

## 2016-11-10 DIAGNOSIS — G20 Parkinson's disease: Secondary | ICD-10-CM | POA: Diagnosis present

## 2016-11-10 DIAGNOSIS — I10 Essential (primary) hypertension: Secondary | ICD-10-CM | POA: Diagnosis present

## 2016-11-10 DIAGNOSIS — Z8249 Family history of ischemic heart disease and other diseases of the circulatory system: Secondary | ICD-10-CM | POA: Diagnosis not present

## 2016-11-10 LAB — BASIC METABOLIC PANEL
Anion gap: 5 (ref 5–15)
BUN: 22 mg/dL — AB (ref 6–20)
CO2: 25 mmol/L (ref 22–32)
CREATININE: 1.02 mg/dL (ref 0.61–1.24)
Calcium: 8.8 mg/dL — ABNORMAL LOW (ref 8.9–10.3)
Chloride: 107 mmol/L (ref 101–111)
Glucose, Bld: 100 mg/dL — ABNORMAL HIGH (ref 65–99)
Potassium: 4 mmol/L (ref 3.5–5.1)
Sodium: 137 mmol/L (ref 135–145)

## 2016-11-10 LAB — CBC WITH DIFFERENTIAL/PLATELET
BASOS PCT: 0 %
Basophils Absolute: 0 10*3/uL (ref 0.0–0.1)
Eosinophils Absolute: 0.2 10*3/uL (ref 0.0–0.7)
Eosinophils Relative: 2 %
HEMATOCRIT: 26.3 % — AB (ref 39.0–52.0)
Hemoglobin: 9.2 g/dL — ABNORMAL LOW (ref 13.0–17.0)
Lymphocytes Relative: 21 %
Lymphs Abs: 2.4 10*3/uL (ref 0.7–4.0)
MCH: 30.6 pg (ref 26.0–34.0)
MCHC: 35 g/dL (ref 30.0–36.0)
MCV: 87.4 fL (ref 78.0–100.0)
MONO ABS: 1 10*3/uL (ref 0.1–1.0)
MONOS PCT: 9 %
NEUTROS ABS: 7.9 10*3/uL — AB (ref 1.7–7.7)
Neutrophils Relative %: 68 %
Platelets: 139 10*3/uL — ABNORMAL LOW (ref 150–400)
RBC: 3.01 MIL/uL — ABNORMAL LOW (ref 4.22–5.81)
RDW: 13.7 % (ref 11.5–15.5)
WBC: 11.5 10*3/uL — ABNORMAL HIGH (ref 4.0–10.5)

## 2016-11-10 LAB — GLUCOSE, CAPILLARY: GLUCOSE-CAPILLARY: 89 mg/dL (ref 65–99)

## 2016-11-10 NOTE — Progress Notes (Signed)
[facility] Progress Note  Patient: Bryan Vega ZOX:[id number]   PCP: Cheron Schaumann., MD DOB: 11/19/1940   DOA: 11/09/2016   DOS: 11/10/2016   Date of Service: the patient was seen and examined on 11/10/2016  Subjective: Continues to feel fatigued and tired. No nausea no vomiting. No fever overnight. No diarrhea reported. Oral intake is improving.  Brief hospital course: Pt. with PMH of Parkinson's disease, neurogenic bladder using self-catheterization at home, hypothyroidism, COPD, IDA; admitted on 11/09/2016, presented with complaint of fever and chills, was found to have UTI. Currently further plan is continue IV antibiotics.  Assessment and Plan: 1. Catheter associated urinary tract infection present on admission. Patient doing in and out self cath during the day and using diapers overnight. Presented with fever and chills leukocytosis, acute kidney injury as well as tachycardia. Currently has a condom catheter. Urine culture currently pending. No fever since admission. Continue IV antibiotics IV cefepime and monitor the results.  2. Neurogenic bladder Recurrent UTI. Patient is on self-catheterization during the day and diaper during the night. Discuss with urology, I recommend to increase the frequency of self-catheterization to ensure complete emptying of the bladder and outpatient follow-up. Patient has at least one episode of UTI once a month this year. Recently treated with Augmentin  3. Parkinson's disease. Continuing home regimen. Patient has autonomic dysregulation continue to monitor for now.  4. Hypothyroidism. Continue Synthroid.  5. Mood disorder. Chronic pain. Continue home regimen.  Diet: Cardiac diet DVT Prophylaxis: subcutaneous Heparin  Advance goals of care discussion: full code  Family Communication: family was present at bedside, at the time of interview. The pt provided permission to discuss medical plan with the family. Opportunity was  given to ask question and all questions were answered satisfactorily.   Disposition:  Discharge to be determined.  Consultants: none Procedures: none  Antibiotics: Anti-infectives    Start     Dose/Rate Route Frequency Ordered Stop   11/09/16 2230  ceFEPIme (MAXIPIME) 1 g in dextrose 5 % 50 mL IVPB     1 g 100 mL/hr over 30 Minutes Intravenous Every 12 hours 11/09/16 2218         Objective: Physical Exam: Vitals:   11/09/16 2204 11/09/16 2345 11/10/16 0613 11/10/16 1325  BP: (!) 105/55 105/70 133/65 (!) 133/54  Pulse: 63 62 66 69  Resp: 16 16 16 16   Temp: 98 F (36.7 C) 98.9 F (37.2 C) 98.9 F (37.2 C) 98.4 F (36.9 C)  TempSrc: Oral Oral Oral Oral  SpO2: 99% 99% 99% 99%  Weight:  75 kg (165 lb 5.5 oz)    Height:  5\' 8"  (1.727 m)      Intake/Output Summary (Last 24 hours) at 11/10/16 1545 Last data filed at 11/10/16 0845  Gross per 24 hour  Intake             1120 ml  Output              300 ml  Net              820 ml   Filed Weights   11/09/16 1900 11/09/16 2345  Weight: 76.2 kg (168 lb) 75 kg (165 lb 5.5 oz)   General: Alert, Awake and Oriented to Time, Place and Person. Appear in mild distress, affect appropriate Eyes: PERRL, Conjunctiva normal ENT: Oral Mucosa clear moist. Neck: no JVD, no Abnormal Mass Or lumps Cardiovascular: S1 and S2 Present, no Murmur, Peripheral Pulses Present Respiratory: normal respiratory effort,  Bilateral Air entry equal and Decreased, no use of accessory muscle, Clear to Auscultation, no Crackles, no wheezes Abdomen: Bowel Sound present, Soft and no tenderness, no hernia Skin: no redness, no Rash, no induration Extremities: no Pedal edema, no calf tenderness Neurologic: Grossly no focal neuro deficit. Bilaterally Equal motor strength  Data Reviewed: CBC:  Recent Labs Lab 11/09/16 1902 11/10/16 0353  WBC 11.3* 11.5*  NEUTROABS 9.7* 7.9*  HGB 10.3* 9.2*  HCT 30.0* 26.3*  MCV 87.5 87.4  PLT 153 139*   Basic  Metabolic Panel:  Recent Labs Lab 11/09/16 1902 11/10/16 0353  NA 138 137  K 4.4 4.0  CL 103 107  CO2 28 25  GLUCOSE 140* 100*  BUN 27* 22*  CREATININE 1.21 1.02  CALCIUM 9.7 8.8*    Liver Function Tests:  Recent Labs Lab 11/09/16 1902  AST 19  ALT <5*  ALKPHOS 56  BILITOT 0.6  PROT 7.3  ALBUMIN 4.0   No results for input(s): LIPASE, AMYLASE in the last 168 hours. No results for input(s): AMMONIA in the last 168 hours. Coagulation Profile: No results for input(s): INR, PROTIME in the last 168 hours. Cardiac Enzymes: No results for input(s): CKTOTAL, CKMB, CKMBINDEX, TROPONINI in the last 168 hours. BNP (last 3 results) No results for input(s): PROBNP in the last 8760 hours. CBG:  Recent Labs Lab 11/10/16 0828  GLUCAP 89   Studies: Dg Chest 2 View  Result Date: 11/09/2016 CLINICAL DATA:  Fevers EXAM: CHEST  2 VIEW COMPARISON:  08/08/2016 FINDINGS: The heart size and mediastinal contours are within normal limits. Both lungs are clear. The visualized skeletal structures are unremarkable. IMPRESSION: No active cardiopulmonary disease. Electronically Signed   By: Alcide Clever M.D.   On: 11/09/2016 19:44    Scheduled Meds: . aspirin EC  81 mg Oral Daily  . atorvastatin  20 mg Oral q1800  . carbidopa-levodopa  1 tablet Oral QHS  . carbidopa-levodopa  1.5 tablet Oral 6 X Daily  . citalopram  10 mg Oral QHS  . docusate sodium  100 mg Oral Daily  . ferrous sulfate  325 mg Oral Q breakfast  . gabapentin  200 mg Oral QPC supper  . heparin  5,000 Units Subcutaneous Q8H  . levothyroxine  88 mcg Oral QAC breakfast  . omega-3 acid ethyl esters  1 g Oral Daily  . pantoprazole  40 mg Oral Daily  . tamsulosin  0.4 mg Oral QHS   Continuous Infusions: . ceFEPime (MAXIPIME) IV Stopped (11/10/16 1109)   PRN Meds: acetaminophen **OR** acetaminophen, albuterol, bisacodyl, HYDROcodone-acetaminophen, ondansetron **OR** ondansetron (ZOFRAN) IV, senna-docusate  Time spent: 35  minutes  Author: Lynden Oxford, MD [facility] Pager: [phone] 11/10/2016 3:45 PM  If 7PM-7AM, please contact night-coverage at [email or web address], password [email or web address]

## 2016-11-11 LAB — BASIC METABOLIC PANEL
ANION GAP: 8 (ref 5–15)
BUN: 18 mg/dL (ref 6–20)
CALCIUM: 9.2 mg/dL (ref 8.9–10.3)
CO2: 27 mmol/L (ref 22–32)
Chloride: 104 mmol/L (ref 101–111)
Creatinine, Ser: 1.07 mg/dL (ref 0.61–1.24)
GFR calc Af Amer: >60 mL/min (ref >60)
GFR calc non Af Amer: >60 mL/min (ref >60)
GLUCOSE: 115 mg/dL — AB (ref 65–99)
POTASSIUM: 4.3 mmol/L (ref 3.5–5.1)
Sodium: 139 mmol/L (ref 135–145)

## 2016-11-11 LAB — CBC WITH DIFFERENTIAL/PLATELET
BASOS ABS: 0 10*3/uL (ref 0.0–0.1)
Basophils Relative: 0 %
Eosinophils Absolute: 0.3 10*3/uL (ref 0.0–0.7)
Eosinophils Relative: 4 %
HEMATOCRIT: 28 % — AB (ref 39.0–52.0)
Hemoglobin: 9.5 g/dL — ABNORMAL LOW (ref 13.0–17.0)
LYMPHS ABS: 2.3 10*3/uL (ref 0.7–4.0)
LYMPHS PCT: 33 %
MCH: 30.1 pg (ref 26.0–34.0)
MCHC: 33.9 g/dL (ref 30.0–36.0)
MCV: 88.6 fL (ref 78.0–100.0)
MONO ABS: 0.5 10*3/uL (ref 0.1–1.0)
Monocytes Relative: 7 %
NEUTROS ABS: 4.1 10*3/uL (ref 1.7–7.7)
Neutrophils Relative %: 56 %
Platelets: 155 10*3/uL (ref 150–400)
RBC: 3.16 MIL/uL — ABNORMAL LOW (ref 4.22–5.81)
RDW: 13.8 % (ref 11.5–15.5)
WBC: 7.2 10*3/uL (ref 4.0–10.5)

## 2016-11-11 LAB — GLUCOSE, CAPILLARY: Glucose-Capillary: 80 mg/dL (ref 65–99)

## 2016-11-11 MED ORDER — SACCHAROMYCES BOULARDII 250 MG PO CAPS
250.0000 mg | ORAL_CAPSULE | Freq: Two times a day (BID) | ORAL | Status: DC
  Administered 2016-11-11: 250 mg via ORAL
  Filled 2016-11-11: qty 1

## 2016-11-11 MED ORDER — CIPROFLOXACIN HCL 500 MG PO TABS: 500.0000 mg | ORAL_TABLET | Freq: Two times a day (BID) | ORAL | Status: DC

## 2016-11-11 MED ORDER — SENNOSIDES-DOCUSATE SODIUM 8.6-50 MG PO TABS: 1.0000 | ORAL_TABLET | Freq: Every evening | ORAL | 0 refills | Status: DC | PRN

## 2016-11-11 MED ORDER — BISACODYL 10 MG RE SUPP
10.0000 mg | Freq: Once | RECTAL | Status: AC
  Administered 2016-11-11: 10 mg via RECTAL
  Filled 2016-11-11: qty 1

## 2016-11-11 MED ORDER — CIPROFLOXACIN HCL 500 MG PO TABS: 500.0000 mg | ORAL_TABLET | Freq: Two times a day (BID) | ORAL | 0 refills | Status: AC

## 2016-11-11 MED ORDER — SACCHAROMYCES BOULARDII 250 MG PO CAPS: 250.0000 mg | ORAL_CAPSULE | Freq: Two times a day (BID) | ORAL | 0 refills | Status: DC

## 2016-11-11 MED ORDER — POLYETHYLENE GLYCOL 3350 17 G PO PACK
17.0000 g | PACK | Freq: Every day | ORAL | Status: DC
  Administered 2016-11-11: 17 g via ORAL
  Filled 2016-11-11: qty 1

## 2016-11-11 MED ORDER — POLYETHYLENE GLYCOL 3350 17 G PO PACK: 17.0000 g | PACK | Freq: Every day | ORAL | 0 refills | Status: AC | PRN

## 2016-11-11 NOTE — Care Management Note (Signed)
Case Management Note  Patient Details  Name: Bryan Vega MRN: [id number] Date of Birth: 1940-04-26  Subjective/Objective:       76 yo admitted with Recurrent Uti. Hx of Parkinsons.            Action/Plan: From home with spouse. PT recommendations reviewed. Pt has wheelchair, shower chair, BSC and RW at home. No need for HHPT at this time. CM will continue to follow.  Expected Discharge Date:   (unknown)               Expected Discharge Plan:  Home/Self Care  In-House Referral:     Discharge planning Services  CM Consult  Post Acute Care Choice:    Choice offered to:     DME Arranged:    DME Agency:     HH Arranged:    HH Agency:     Status of Service:  In process, will continue to follow  If discussed at Long Length of Stay Meetings, dates discussed:    Additional CommentsBartholome Bill, RN 11/11/2016, 1:27 PM [phone]

## 2016-11-11 NOTE — Progress Notes (Signed)
Patient d/c home with family. Discharge instructions given and discussed with son and wife, verbalized understanding. Patient had a bowel movement earlier. Patient is stable.

## 2016-11-11 NOTE — Evaluation (Signed)
Physical Therapy Evaluation Patient Details Name: Bryan Vega MRN: [id number] DOB: 1940/10/21 Today's Date: 11/11/2016   History of Present Illness  Bryan Vega is a 76 y.o. gentleman with a history of Parkinson's disease, autonomic dysfunction with orthostatic hypertension (attributed to Parkinson's disease per family), neurogenic bladder (also attributed to Parkinson's disease; he requires in and out cath 2-3 times daily), HTN, HLD, COPD, and hypothyroidism; pt presents to the ED on 11/11/16 with fever, weakness  Clinical Impression  Pt admitted as above and presenting with functional mobility limitations 2* generalized weakness and balance and movement deficits associated with Parkinsons.  Pt ambulated in hall this am with spouse present.  Pt and spouse report movement approaching baseline for pt in mornings prior to showering.  Pt plans dc home with family assist.    Follow Up Recommendations No PT follow up    Equipment Recommendations  None recommended by PT    Recommendations for Other Services       Precautions / Restrictions Precautions Precautions: Fall Restrictions Weight Bearing Restrictions: No      Mobility  Bed Mobility Overal bed mobility: Needs Assistance Bed Mobility: Supine to Sit     Supine to sit: Min assist     General bed mobility comments: Pt able to bring LEs to EOB but requiring min assist to bring trunk to upright and complete transition to sitting EOB  Transfers Overall transfer level: Needs assistance Equipment used: Rolling walker (2 wheeled) Transfers: Sit to/from Stand Sit to Stand: Min assist         General transfer comment: cues for transition position and use of UEs to self assist  Ambulation/Gait Ambulation/Gait assistance: Min assist;Min guard Ambulation Distance (Feet): 111 Feet Assistive device: Rolling walker (2 wheeled) Gait Pattern/deviations: Step-to pattern;Step-through pattern;Decreased step length - right;Decreased  step length - left;Shuffle;Trunk flexed Gait velocity: decr Gait velocity interpretation: Below normal speed for age/gender General Gait Details: Several short standing rest breaks with cues for posture and position from [employer]            Wheelchair Mobility    Modified Rankin (Stroke Patients Only)       Balance Overall balance assessment: Needs assistance Sitting-balance support: No upper extremity supported;Feet supported Sitting balance-Leahy Scale: Good     Standing balance support: Bilateral upper extremity supported Standing balance-Leahy Scale: Poor                               Pertinent Vitals/Pain Pain Assessment: No/denies pain    Home Living Family/patient expects to be discharged to:: Private residence Living Arrangements: Spouse/significant other Available Help at Discharge: Family;Available 24 hours/day Type of Home: House Home Access: Stairs to enter   [employer] of Steps: 1 Home Layout: One level Home Equipment: Shower seat;Grab bars - tub/shower;Bedside commode;Wheelchair - [employer] - 2 wheels      Prior Function Level of Independence: Needs assistance   Gait / Transfers Assistance Needed: Wife A with bed mobility prior to shower,  MIN A with transfer and bed mobility, mod I ambulation  ADL's / Homemaking Assistance Needed: assist for showering,uses shower seat, has aide         Hand Dominance        Extremity/Trunk Assessment   Upper Extremity Assessment Upper Extremity Assessment: Generalized weakness    Lower Extremity Assessment Lower Extremity Assessment: Generalized weakness    Cervical / Trunk Assessment Cervical / Trunk Assessment: Kyphotic  Communication  Communication: Expressive difficulties  Cognition Arousal/Alertness: Awake/alert Behavior During Therapy: WFL for tasks assessed/performed Overall Cognitive Status: Within Functional Limits for tasks assessed                                         General Comments      Exercises     Assessment/Plan    PT Assessment Patient needs continued PT services  PT Problem List Decreased strength;Decreased activity tolerance;Decreased balance;Decreased mobility;Decreased knowledge of use of DME       PT Treatment Interventions DME instruction;Gait training;Functional mobility training;Therapeutic activities;Stair training;Patient/family education    PT Goals (Current goals can be found in the Care Plan section)  Acute Rehab PT Goals Patient Stated Goal: Resume previous lifestyle PT Goal Formulation: With patient/family Time For Goal Achievement: 11/22/16 Potential to Achieve Goals: Good    Frequency Min 3X/week   Barriers to discharge        Co-evaluation               AM-PAC PT "6 Clicks" Daily Activity  Outcome Measure Difficulty turning over in bed (including adjusting bedclothes, sheets and blankets)?: A Lot Difficulty moving from lying on back to sitting on the side of the bed? : Unable Difficulty sitting down on and standing up from a chair with arms (e.g., wheelchair, bedside commode, etc,.)?: Unable Help needed moving to and from a bed to chair (including a wheelchair)?: A Lot Help needed walking in hospital room?: A Little Help needed climbing 3-5 steps with a railing? : A Lot 6 Click Score: 11    End of Session Equipment Utilized During Treatment: Gait belt Activity Tolerance: Patient tolerated treatment well Patient left: in chair;with call bell/phone within reach;with family/visitor present Nurse Communication: Mobility status PT Visit Diagnosis: Unsteadiness on feet (R26.81);Difficulty in walking, not elsewhere classified (R26.2)    Time: 1610-9604 PT Time Calculation (min) (ACUTE ONLY): 20 min   Charges:   PT Evaluation $PT Eval Low Complexity: 1 Low     PT G Codes:        Pg [phone]   Bryan Vega 11/11/2016, 1:19 PM

## 2016-11-11 NOTE — Discharge Instructions (Signed)
Clean Intermittent Catheterization, Male  Clean intermittent catheterization (CIC) is a procedure to remove urine from the bladder by placing a small, flexible tube (catheter) into the bladder though the urethra. The urethra is a tube in the body that carries urine from the bladder out of the body.  CIC may be done when:  · You cannot completely empty your bladder on your own. This may be due to a blockage in the bladder or urethra.  · Your bladder leaks urine. This may happen when the muscles or nerves near the bladder are not working normally, so the bladder overflows.    Your health care provider will show you how to perform CIC and will help you to feel comfortable performing this procedure at home. Your health care provider will also help you to get the home care supplies that are needed for this procedure.  What supplies will I need?  · Germ-free (sterile), water-based lubricant.  · A container for urine collection. You may also use the toilet to dispose of urine from the catheter.  · A catheter. Your health care provider will determine the best size for you.  · Sterile gloves.  · Sterile gauze.  · Medicated sterile swabs.  How do I perform the procedure?  Most people need CIC at least 4 times per day to adequately empty the bladder. Your health care provider will tell you how often you should perform CIC.  To perform CIC, follow these steps:  1. Wash your hands with soap and water. If soap and water are not available, use hand sanitizer.  2. Prepare the supplies that you will use during the procedure. Open the catheter pack, the lubricant, and the pack of medicated sterile swabs. If you have been told to keep the procedure sterile, do not touch your supplies until you are wearing gloves.  3. Get in a comfortable position. Possible positions include:  ? Sitting on a toilet, a chair, or the edge of a bed.  ? Standing near a toilet.  ? Lying down with your head raised on pillows and your knees pointing to  the ceiling.  4. If you are using a urine collection container, position it between your legs.  5. Urinate, if you are able.  6. Put on gloves.  7. Apply lubricant to about 2 inches (5 cm) of the tip of the catheter.  8. Set the catheter down on a clean, dry surface within reach.  9. Gently stretch your penis out from your body. Pull back any skin that covers the end of your penis (foreskin). Clean the end of your penis with medicated sterile swabs as told by your health care provider.  10. Hold your penis upward at a 45–60 degree angle. This helps to straighten the urethra.  11. Slowly insert the lubricated catheter 2–3 inches (5–8 cm) straight into your urethra until urine flows freely. Allow urine to drain into the toilet or the urine collection container.  12. When urine starts to flow freely, insert the catheter 1 inch (3 cm) more.  13. When urine stops flowing, slowly remove the catheter.  14. Note the color, amount, and odor of the urine.  15. Clean your penis using soap and water.  16. Wash your hands with soap and water.  17. Follow package instructions about how to clean the catheter after each use.    What should I do at home?  How Often Should I Perform CIC?  · Do CIC to empty your bladder every   4–6 hours or as often as told by your health care provider.  · If you have symptoms of too much urine in your bladder (overdistension) and you are not able to urinate, perform CIC. Symptoms of overdistension may include:  ? Restlessness.  ? Sweating or chills.  ? Headache.  ? Flushed or pale skin.  ? Cold limbs.  ? Bloated lower abdomen.  What Are Some Steps That I Can Take to Avoid Problems?  · Drink enough fluid to keep your urine clear or pale yellow.  · Avoid caffeine. Caffeine may make you urinate more frequently and more urgently.  · Dispose of a multiple use catheter when it becomes dry, brittle, or cloudy. This usually happens after you use the catheter for 1 week.   · Take over-the-counter and prescription medicines only as told by your health care provider.  · Keep all follow-up visits as told by your health care provider. This is important.  Contact a health care provider if:  · You have difficulty performing CIC.  · You have urine leaking during CIC.  · You have:  ? Dark or cloudy urine.  ? Blood in your urine or in your catheter.  ? A change in the smell of your urine or discharge.  ? A burning feeling while you urinate.  · You feel nauseous or you vomit.  · You have pain in your abdomen, your back, or your sides below your ribs.  · You have swelling or redness around the opening of your urethra.  · You develop a rash or sores on your skin.  Get help right away if:  · You have a fever.  · You have symptoms that do not go away after 3 days.  · You have symptoms that suddenly get worse.  · You have severe pain.  · The amount of urine that drains from your bladder decreases.  This information is not intended to replace advice given to you by your health care provider. Make sure you discuss any questions you have with your health care provider.  Document Released: 04/12/2010 Document Revised: 08/16/2015 Document Reviewed: 09/22/2014  Elsevier Interactive Patient Education © 2018 [employer]

## 2016-11-11 NOTE — Progress Notes (Signed)
Nutrition Brief Note  Patient identified on the Malnutrition Screening Tool (MST) Report  Pt with good intakes so far this admission. Weight is stable.  Wt Readings from Last 15 Encounters:  11/09/16 165 lb 5.5 oz (75 kg)  08/08/16 175 lb (79.4 kg)  07/17/16 173 lb (78.5 kg)  06/26/16 169 lb 15.6 oz (77.1 kg)  01/23/16 177 lb (80.3 kg)  01/20/16 166 lb 0.1 oz (75.3 kg)  12/20/15 157 lb (71.2 kg)    Body mass index is 25.14 kg/m. Patient meets criteria for overweight based on current BMI.   Current diet order is regular, patient is consuming approximately 100% of meals at this time. Labs and medications reviewed.   No nutrition interventions warranted at this time. If nutrition issues arise, please consult RD.   Tilda Franco, MS, RD, LDN Pager: [phone] After Hours Pager: [phone]

## 2016-11-13 LAB — URINE CULTURE

## 2016-11-14 ENCOUNTER — Encounter: Payer: Self-pay | Admitting: Neurology

## 2016-11-15 LAB — CULTURE, BLOOD (ROUTINE X 2)
Culture: NO GROWTH
Culture: NO GROWTH
Special Requests: ADEQUATE
Special Requests: ADEQUATE

## 2016-11-17 NOTE — Discharge Summary (Signed)
[facility] Discharge Summary   Patient: Bryan Vega ZOX:[id number]   PCP: Cheron Schaumann., MD DOB: [birth year]   Date of admission: 11/09/2016   Date of discharge: 11/11/2016     Discharge Diagnoses:  Principal Problem:   Recurrent UTI Active Problems:   Parkinson disease (HCC)   Neurogenic bladder   UTI (urinary tract infection)   Admitted From: home Disposition:  home  Recommendations for Outpatient Follow-up:  1. Please follow up with urology in 2 weks   Follow-up Information    Velazquez, Vira Browns., MD. Schedule an appointment as soon as possible for a visit in 1 week(s).   Specialty:  Internal Medicine Contact information: [address] [place] [address] [phone]        Marcine Matar, MD. Schedule an appointment as soon as possible for a visit in 1 month(s).   Specialty:  Urology Contact information: [address] [place] [address] [phone]          Diet recommendation: regular diet  Activity: The patient is advised to gradually reintroduce usual activities.  Discharge Condition: good  Code Status: full code  History of present illness: As per the H and P dictated on admission, " Bryan Vega is a 76 y.o. male with medical history significant for Parkinson's disease, iron deficiency anemia, hypothyroidism, COPD, and recurrent UTI, now presenting to the emergency department for evaluation of fevers, chills, and lethargy. Patient was reportedly in his usual state of health until 2 days ago when he noted the development of mild chills and fatigue. Yesterday he experienced worsening in his chills, was noted to be febrile, and slept most of the day. He typically ambulates with a walker and is able to attend to his basic ADLs, but has been unable to do this for the past day due to his lethargy. He has recurrent UTI and completed a course of Augmentin 2 weeks ago. Denies any flank pain, denies chest pain or cough, and denies headache,  change in vision or hearing, or focal numbness or weakness."  Hospital Course:  Summary of his active problems in the hospital is as following. 1. Catheter associated urinary tract infection present on admission. Patient doing in and out self cath during the day and using diapers overnight. Presented with fever and chills leukocytosis, acute kidney injury as well as tachycardia. Currently has a condom catheter. Urine culture currently pending. No fever since admission. Family requested discharge home, culture were reincubated on the day of discharge.  Based on prior culture data, cipro will be efective to treat and will finish 5 day course.   2. Neurogenic bladder Recurrent UTI. Patient is on self-catheterization during the day and diaper during the night. Discuss with urology, I recommend to increase the frequency of self-catheterization to ensure complete emptying of the bladder and outpatient follow-up. Patient has at least one episode of UTI once a month this year. Recently treated with Augmentin  3. Parkinson's disease. Continuing home regimen. Patient has autonomic dysregulation  4. Hypothyroidism. Continue Synthroid.  5. Mood disorder. Chronic pain. Continue home regimen.  All other chronic medical condition were stable during the hospitalization.  Patient was seen by physical therapy, who recommended no PT follow up needed On the day of the discharge the patient's vitals were stable, and no other acute medical condition were reported by patient. the patient was felt safe to be discharge at hoe with family.  Procedures and Results:  none   Consultations:  none  DISCHARGE MEDICATION:  Discharge Medication List as of 11/11/2016  3:33 PM    START taking these medications   Details  ciprofloxacin (CIPRO) 500 MG tablet Take 1 tablet (500 mg total) by mouth 2 (two) times daily., Starting Tue 11/11/2016, Until Thu 11/13/2016, Normal    saccharomyces boulardii  (FLORASTOR) 250 MG capsule Take 1 capsule (250 mg total) by mouth 2 (two) times daily., Starting Tue 11/11/2016, Normal    senna-docusate (SENOKOT-S) 8.6-50 MG tablet Take 1 tablet by mouth at bedtime as needed for mild constipation., Starting Tue 11/11/2016, Normal      CONTINUE these medications which have CHANGED   Details  polyethylene glycol (MIRALAX / GLYCOLAX) packet Take 17 g by mouth daily as needed for mild constipation., Starting Tue 11/11/2016, Normal      CONTINUE these medications which have NOT CHANGED   Details  albuterol (PROVENTIL HFA;VENTOLIN HFA) 108 (90 Base) MCG/ACT inhaler Inhale 2 puffs into the lungs 4 (four) times daily as needed for wheezing or shortness of breath., Starting Tue 04/01/2016, Until Wed 04/01/2017, Historical Med    aspirin EC 81 MG tablet Take 81 mg by mouth daily., Historical Med    atorvastatin (LIPITOR) 20 MG tablet Take 20 mg by mouth at bedtime. , Historical Med    CALCIUM PO Take 1 tablet by mouth daily., Historical Med    carbidopa-levodopa (SINEMET CR) 50-200 MG tablet Take 1 tablet by mouth at bedtime., Starting Fri 09/26/2016, Normal    carbidopa-levodopa (SINEMET IR) 25-100 MG tablet Take 1.5 tablets by mouth 6 (six) times daily. 1.5/1/1.5/1/1.5/1, Historical Med    citalopram (CELEXA) 20 MG tablet Take 0.5 tablets (10 mg total) by mouth at bedtime., Starting Thu 06/26/2016, No Print    docusate sodium (COLACE) 100 MG capsule Take 100 mg by mouth daily. , Historical Med    ferrous sulfate 325 (65 FE) MG tablet Take 325 mg by mouth daily with breakfast., Historical Med    gabapentin (NEURONTIN) 100 MG capsule Take 2 capsules (200 mg total) by mouth daily after supper., Starting Fri 09/26/2016, Normal    levothyroxine (SYNTHROID, LEVOTHROID) 88 MCG tablet Take 88 mcg by mouth daily before breakfast., Historical Med    Omega-3 Fatty Acids (FISH OIL PO) Take 1 capsule by mouth daily., Historical Med    omeprazole (PRILOSEC) 20 MG capsule Take 20  mg by mouth daily., Historical Med    tamsulosin (FLOMAX) 0.4 MG CAPS capsule Take 0.4 mg by mouth at bedtime. , Historical Med      STOP taking these medications     cephALEXin (KEFLEX) 500 MG capsule        No Known Allergies Discharge Instructions    Ambulatory referral to Urology    Complete by:  As directed    Diet - low sodium heart healthy    Complete by:  As directed    Discharge instructions    Complete by:  As directed    It is important that you read following instructions as well as go over your medication list with RN to help you understand your care after this hospitalization.  Discharge Instructions: Please follow-up with PCP in one week  Please request your primary care physician to go over all Hospital Tests and Procedure/Radiological results at the follow up,  Please get all Hospital records sent to your PCP by signing hospital release before you go home.   Do not take more than prescribed Pain, Sleep and Anxiety Medications. You were cared for by a hospitalist during your  hospital stay. If you have any questions about your discharge medications or the care you received while you were in the hospital after you are discharged, you can call office at [phone] and ask to speak with the hospitalist on call if the hospitalist that took care of you is not available.  Once you are discharged, your primary care physician will handle any further medical issues. Please note that NO REFILLS for any discharge medications will be authorized once you are discharged, as it is imperative that you return to your primary care physician (or establish a relationship with a primary care physician if you do not have one) for your aftercare needs so that they can reassess your need for medications and monitor your lab values. You Must read complete instructions/literature along with all the possible adverse reactions/side effects for all the Medicines you take and that have been  prescribed to you. Take any new Medicines after you have completely understood and accept all the possible adverse reactions/side effects. Wear Seat belts while driving. If you have smoked or chewed Tobacco in the last 2 yrs please stop smoking and/or stop any Recreational drug use.   In and Out Cath    Complete by:  As directed    Every 6 hours.   Increase activity slowly    Complete by:  As directed      Discharge Exam: Filed Weights   11/09/16 1900 11/09/16 2345  Weight: 76.2 kg (168 lb) 75 kg (165 lb 5.5 oz)   Vitals:   11/11/16 0456 11/11/16 1430  BP: (!) 138/57 (!) 144/60  Pulse: 63 64  Resp: 16 17  Temp: 98.6 F (37 C) 97.8 F (36.6 C)  SpO2: 98% 99%   General: Appear in no distress, no Rash; Oral Mucosa moist. Cardiovascular: S1 and S2 Present, no Murmur, no JVD Respiratory: Bilateral Air entry present and Clear to Auscultation, no Crackles, no wheezes Abdomen: Bowel Sound present, Soft and no tenderness Extremities: no Pedal edema, no calf tenderness Neurology: Grossly no focal neuro deficit.  The results of significant diagnostics from this hospitalization (including imaging, microbiology, ancillary and laboratory) are listed below for reference.    Significant Diagnostic Studies: Dg Chest 2 View  Result Date: 11/09/2016 CLINICAL DATA:  Fevers EXAM: CHEST  2 VIEW COMPARISON:  08/08/2016 FINDINGS: The heart size and mediastinal contours are within normal limits. Both lungs are clear. The visualized skeletal structures are unremarkable. IMPRESSION: No active cardiopulmonary disease. Electronically Signed   By: Alcide Clever M.D.   On: 11/09/2016 19:44    Microbiology: Recent Results (from the past 240 hour(s))  Blood culture (routine x 2)     Status: None   Collection Time: 11/09/16  7:47 PM  Result Value Ref Range Status   Specimen Description BLOOD LEFT WRIST  Final   Special Requests   Final    BOTTLES DRAWN AEROBIC AND ANAEROBIC Blood Culture adequate volume     Culture NO GROWTH 5 DAYS  Final   Report Status 11/15/2016 FINAL  Final  Blood culture (routine x 2)     Status: None   Collection Time: 11/09/16  7:52 PM  Result Value Ref Range Status   Specimen Description BLOOD RIGHT WRIST  Final   Special Requests   Final    BOTTLES DRAWN AEROBIC AND ANAEROBIC Blood Culture adequate volume   Culture NO GROWTH 5 DAYS  Final   Report Status 11/15/2016 FINAL  Final  Urine culture     Status: None  Collection Time: 11/10/16 12:00 PM  Result Value Ref Range Status   Specimen Description URINE, RANDOM  Final   Special Requests URINE, RANDOM  Final   Culture   Final    PATIENT IDENTIFICATION ERROR. PLEASE DISREGARD RESULTS. ACCOUNT WILL BE CREDITED. Performed at [facility], [address] [place], [place] [address]    Report Status 11/13/2016 FINAL  Final  Urine Culture     Status: Abnormal   Collection Time: 11/11/16  3:40 PM  Result Value Ref Range Status   Specimen Description URINE, RANDOM  Final   Special Requests NONE  Final   Culture >=100,000 COLONIES/mL KLEBSIELLA PNEUMONIAE (A)  Final   Report Status 11/13/2016 FINAL  Final   Organism ID, Bacteria KLEBSIELLA PNEUMONIAE (A)  Final      Susceptibility   Klebsiella pneumoniae - MIC*    AMPICILLIN RESISTANT Resistant     CEFAZOLIN <=4 SENSITIVE Sensitive     CEFTRIAXONE <=1 SENSITIVE Sensitive     CIPROFLOXACIN <=0.25 SENSITIVE Sensitive     GENTAMICIN <=1 SENSITIVE Sensitive     IMIPENEM <=0.25 SENSITIVE Sensitive     NITROFURANTOIN 32 SENSITIVE Sensitive     TRIMETH/SULFA <=20 SENSITIVE Sensitive     AMPICILLIN/SULBACTAM 4 SENSITIVE Sensitive     PIP/TAZO <=4 SENSITIVE Sensitive     Extended ESBL NEGATIVE Sensitive     * >=100,000 COLONIES/mL KLEBSIELLA PNEUMONIAE     Labs: CBC:  Recent Labs Lab 11/11/16 1144  WBC 7.2  NEUTROABS 4.1  HGB 9.5*  HCT 28.0*  MCV 88.6  PLT 155   Basic Metabolic Panel:  Recent Labs Lab 11/11/16 1144  NA 139  K 4.3  CL 104   CO2 27  GLUCOSE 115*  BUN 18  CREATININE 1.07  CALCIUM 9.2   Liver Function Tests: No results for input(s): AST, ALT, ALKPHOS, BILITOT, PROT, ALBUMIN in the last 168 hours. No results for input(s): LIPASE, AMYLASE in the last 168 hours. No results for input(s): AMMONIA in the last 168 hours. Cardiac Enzymes: No results for input(s): CKTOTAL, CKMB, CKMBINDEX, TROPONINI in the last 168 hours. BNP (last 3 results)  Recent Labs  06/24/16 1426 06/24/16 2037  BNP 104.2* 155.1*   CBG:  Recent Labs Lab 11/11/16 0728  GLUCAP 80   Time spent: 35 minutes  Signed:  Tagg, Ipek Westra  [facility] 11/11/2016   , 11:07 AM

## 2016-12-11 NOTE — Progress Notes (Signed)
Bryan Vega was seen today in the movement disorders clinic for neurologic consultation at the request of [name], PA-C.    This patient is accompanied in the office by his son and wife who supplements the history. The consultation is for the evaluation of PD.  He was previously seen by Dr. Hyacinth Meeker at [facility].  The records that were made available to me were reviewed.  He was last seen by Dr. Hyacinth Meeker on 06/04/16.   Pt dx with PD in approximately 2011/2012 and his first sx was R hand tremor.  He was started on carbidopa/levodopa 25/100 at the time of dx and has never been on any other medication for the diagnosis.  He is currently on carbidopa/levodopa 25/100, 2 po tid (6am/12pm/5pm).  Pt isn't sure that med has ever been helpful but son thinks that it has.    His PD meds have been adjusted (up and down) over time to accomodate [facility].  Just recently, PCP added labetolol and hydralazine because BP max were in 180's.  Not long thereafter (06/24/16) [facility].  He was also hyponatremic during that stay and celexa dose decreased as that was felt possible contributor to etiology.  He followed back up with his treating physician assistant and had high blood pressures and then norvasc added on 07/11/16.  09/26/16 update:  Patient seen today, accompanied by his wife and son in law who supplement the history.  The patient is now on carbidopa/levodopa 25/100 on the following schedule: 1.5 tablet/1 tablet/1.5 tablet/1 tablet/1.5 tablet/1 tablet.  He was in the hospital for urosepsis.  This significantly brought out symptoms.  This was at the end of May.  I reviewed those records.  He is now off of all blood pressure medications.  On requip 0.5 mg, 3 tablets after dinner and 2 at bed.  It was increased with the course of time.  His daughter states that it was started because of a pulling sensation in the legs.  Had 2 falls related to freezing.  No hallucinations.  Noting still having BP  fluctuations  12/12/16 update:  Pt seen as a work in today.  This patient is accompanied in the office by his wife and son who supplements the history.  Multiple phone calls have been received since last visit by his daughter and he now has an appointment at [facility] but that was not until March originally.  I did email Dr. Zola Button to see if we could get him in sooner but didn't hear back.  I had my office call [facility] yesterday and they had an appointment for January.  Last visit, I suggested weaning the patient off of Requip because of memory change and orthostasis.  I recommended that he take carbidopa/levodopa 25/100 on the following schedule: 1.5 tablets 5 times per day and 2 tablets at 6 pm.   They state they are taking 1.5 tablets 6 times per day.   I recommended we add carbidopa/levodopa 50/200 at bed for RLS symptoms, for which requip was previously being used.    RLS is well controlled.  Patient's daughter had called suggesting that perhaps I prescribed Apokyn to the patient for a pulling sensation in his neck.  I explained that this medication would not be appropriate given his severe orthostatic symptoms in the past.  She did not want to further changes levodopa dose as she felt that this was not beneficial.  Duopa was mentioned on the telephone as perhaps an  alternative.  Interestingly, only 2 days after this phone call the patient ended up admitted to the hospital with catheter associated klebsiella UTI.  No therapy came to the house after d/c from the hospital.  He has had more freezing and especially tough in the AM.  It takes an hour to turn "on."  One of biggest c/o is neck pulling that is unrelated to medication timing.  If he is standing, he feels that the pull will pull him off of balance.  Its slightly painful.  No hallucinations.  Memory has been really good since meds adjusted, so much so that he has been able to manage his own meds with minimal assist from wife laying out his meds.   PREVIOUS  MEDICATIONS: Sinemet  ALLERGIES:  No Known Allergies  CURRENT MEDICATIONS:  Outpatient Encounter Prescriptions as of 12/12/2016  Medication Sig  . albuterol (PROVENTIL HFA;VENTOLIN HFA) 108 (90 Base) MCG/ACT inhaler Inhale 2 puffs into the lungs 4 (four) times daily as needed for wheezing or shortness of breath.  Marland Kitchen aspirin EC 81 MG tablet Take 81 mg by mouth daily.  Marland Kitchen atorvastatin (LIPITOR) 20 MG tablet Take 20 mg by mouth at bedtime.   Marland Kitchen CALCIUM PO Take 1 tablet by mouth daily.  . carbidopa-levodopa (SINEMET CR) 50-200 MG tablet Take 1 tablet by mouth at bedtime.  . carbidopa-levodopa (SINEMET IR) 25-100 MG tablet Take 1.5 tablets by mouth 6 (six) times daily.   . citalopram (CELEXA) 20 MG tablet Take 0.5 tablets (10 mg total) by mouth at bedtime.  . docusate sodium (COLACE) 100 MG capsule Take 100 mg by mouth daily.   . ferrous sulfate 325 (65 FE) MG tablet Take 325 mg by mouth daily with breakfast.  . gabapentin (NEURONTIN) 100 MG capsule Take 2 capsules (200 mg total) by mouth daily after supper.  . levothyroxine (SYNTHROID, LEVOTHROID) 88 MCG tablet Take 88 mcg by mouth daily before breakfast.  . Omega-3 Fatty Acids (FISH OIL PO) Take 1 capsule by mouth daily.  Marland Kitchen omeprazole (PRILOSEC) 20 MG capsule Take 20 mg by mouth daily.  . polyethylene glycol (MIRALAX / GLYCOLAX) packet Take 17 g by mouth daily as needed for mild constipation.  . saccharomyces boulardii (FLORASTOR) 250 MG capsule Take 1 capsule (250 mg total) by mouth 2 (two) times daily.  . [DISCONTINUED] senna-docusate (SENOKOT-S) 8.6-50 MG tablet Take 1 tablet by mouth at bedtime as needed for mild constipation.  . [DISCONTINUED] tamsulosin (FLOMAX) 0.4 MG CAPS capsule Take 0.4 mg by mouth at bedtime.    No facility-administered encounter medications on file as of 12/12/2016.     PAST MEDICAL HISTORY:   Past Medical History:  Diagnosis Date  . Bacteria in urine 07/16/2016  . COPD (chronic obstructive pulmonary disease) (HCC)    . Hyperlipidemia   . Hypertension   . Hypothyroidism   . Neurogenic bladder   . 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 Parkinson's disease (HCC)   . 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 APPENDECTOMY    . JOINT REPLACEMENT    . 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 Marital status: Married    Spouse name: N/A  . Number of children: N/A  . Years of education: N/A   Occupational History  . Not on file.   Social History Main Topics  . Smoking status:  Former Smoker    Packs/day: 0.50    Years: 20.00    Types: Cigarettes    Quit date: 04/20/1996  . Smokeless tobacco: Never Used  . Alcohol use No  . Drug use: No  . Sexual activity: No   Other Topics Concern  . Not on file   Social History Narrative  . No narrative on file    FAMILY HISTORY:   Family Status  Relation Status  . Mother Deceased  . Father Deceased  . Brother Deceased  . Child Alive    ROS:  A complete 10 system review of systems was obtained and was unremarkable apart from what is mentioned above.  PHYSICAL EXAMINATION:    VITALS:   Vitals:   12/12/16 0831  BP: 120/76  Pulse: 62  SpO2: 98%    GEN:  The patient appears stated age and is in NAD. HEENT:  Normocephalic, atraumatic.  The mucous membranes are moist. The superficial temporal arteries are without ropiness or tenderness.  Drooling is noted. CV:  RRR Lungs:  CTAB Neck/HEME:  There are no carotid bruits bilaterally.  Neurological examination:  Orientation: He is alert and oriented x 3.  Looks to family to provide finer aspects of hx.   Montreal Cognitive Assessment  07/17/2016  Visuospatial/ Executive (0/5) 2  Naming (0/3) 2  Attention: Read list of digits (0/2) 1  Attention: Read list of letters (0/1) 1  Attention: Serial 7 subtraction starting at 100 (0/3) 3  Language: Repeat phrase  (0/2) 0  Language : Fluency (0/1) 0  Abstraction (0/2) 0  Delayed Recall (0/5) 0  Orientation (0/6) 6  Total 15  Adjusted Score (based on education) 15   Cranial nerves: There is good facial symmetry.  The speech is pseudobulbar in quality. Soft palate rises symmetrically and there is no tongue deviation. Hearing is intact to conversational tone. Sensation: Sensation is intact to light and pinprick throughout (facial, trunk, extremities). Vibration is intact at the bilateral big toe. There is no extinction with double simultaneous stimulation. There is no sensory dermatomal level identified. Motor: Strength is 5/5 in the bilateral upper and lower extremities.   Shoulder shrug is equal and symmetric.  There is no pronator drift.  Movement examination: Tone: There is good tone today Abnormal movements:mild dyskinesia of the L leg Coordination:  There is mild decremation with RAM's, seen with hand opening and closing, finger taps, alternation of supination but mostly with heel and toe taps Gait and Station: The patient has mild difficulty arising out of a deep-seated chair without the use of the hands and instead pushes off the [facility] and has mild assist from examiner and son. The patient does well with the walker but slightly drags the right leg.  ASSESSMENT/PLAN:  1.  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   -gave family some parameters for use of carbidopa/levodopa 25/100.  Currently taking 9 tablets per day.  Can take up to 12 per day, but would recommend that we take more in the AM.  Currently taking 1.5 tablets 6 times per day.  Told family increase first dose to 2 tablets for a week to see if that helps morning "on" and if not increase that to 2-1/2 tablets for a week and  if not helpful go although it up to 3 tablets in the morning.  -on carbidopa/levodopa 50/200 for RLS and that has been helpful.  He will continue that.  -explained again that UTI's can make the physical sx's of PD worse. Have had several phone calls here to change meds, only to find out that patient has UTI.  -pt has appointment [facility] [place]  -have weaned him off of requip because of OH and memory change  -Long discussion about duopa and I showed them the tube in the mechanism.  His son seemed somewhat interested.  They are going to think about that.  Talked about the pump/patch that is currently in trial period that is obviously not an option currently.  -I am having difficulty understanding whether or not this had pulling is an on or off phenomenon.  He definitely had some dyskinesia today.  We may be able to try very low dose amantadine and see if that helps without making memory worse.  I did not want to add that today.  -discussed methods to help with freezing (big steps, stopping before moving, marching, etc)  2.  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  3.  He is going to be following up with [facility] in January and we will decide if he needs to follow-up here or will continue to follow there.  Much greater than 50% of this visit was spent in counseling and coordinating care.  Total face to face time:  30 min     Cc:  Cheron Schaumann., MD

## 2016-12-12 ENCOUNTER — Encounter: Payer: Self-pay | Admitting: Neurology

## 2016-12-12 ENCOUNTER — Ambulatory Visit (INDEPENDENT_AMBULATORY_CARE_PROVIDER_SITE_OTHER): Payer: Medicare Other | Admitting: Neurology

## 2016-12-12 VITALS — BP 120/76 | HR 62

## 2016-12-12 DIAGNOSIS — G20 Parkinson's disease: Secondary | ICD-10-CM

## 2016-12-12 NOTE — Patient Instructions (Addendum)
1. We were able to move your appt with [facility] to April 10, 2017 at 9:20 am. They will send you more information in the mail. To check for cancellations you can call [phone].  2. Switch to a standard walker.   3. Consider Duopa. Handouts given.   4. You are currently taking Carbidopa Levodopa 25/100 IR - 9 tablets daily.  You can increase to a total of 12 tablets daily. Continue Carbidopa Levodopa 50/200 CR at night.   Because of problems with morning on time, we recommend increasing morning dose to 2 tablets. If this doesn't work you can increase to 2.5 tablets, then 3 tablets. Do not go above three tablets at a time.   5. Keep follow up with [facility] and then decide if you want to see [place] back.

## 2016-12-17 ENCOUNTER — Ambulatory Visit: Payer: Medicare Other | Admitting: Infectious Disease

## 2016-12-29 ENCOUNTER — Ambulatory Visit: Payer: Medicare Other | Admitting: Neurology

## 2017-01-07 ENCOUNTER — Telehealth: Payer: Self-pay | Admitting: Neurology

## 2017-01-07 DIAGNOSIS — G20 Parkinson's disease: Secondary | ICD-10-CM

## 2017-01-07 NOTE — Telephone Encounter (Signed)
Left message on machine for patient's daughter to call back.  To see if they want any particular location/company for PT. Awaiting call back.

## 2017-01-07 NOTE — Telephone Encounter (Signed)
Sure

## 2017-01-07 NOTE — Telephone Encounter (Signed)
Patient's daughter called needing to see if her dad could start PT? She said he is feeling better with his Blood Pressure. Please Advise. Thanks

## 2017-01-08 NOTE — Telephone Encounter (Signed)
Patient's daughter called back and states she thinks they have used [facility] in the past, but they have no preference.  [facility]

## 2017-01-13 ENCOUNTER — Telehealth: Payer: Self-pay | Admitting: Neurology

## 2017-01-13 ENCOUNTER — Ambulatory Visit: Payer: Medicare Other | Admitting: Neurology

## 2017-01-13 NOTE — Telephone Encounter (Signed)
New Message  Sean verbalized requesting verbal order for PT for twice a week for six weeks  Drug interaction between carbidop-levodopa and pts iron medication.

## 2017-01-13 NOTE — Telephone Encounter (Signed)
[facility].

## 2017-01-21 ENCOUNTER — Other Ambulatory Visit: Payer: Self-pay | Admitting: Neurology

## 2017-01-23 ENCOUNTER — Telehealth: Payer: Self-pay | Admitting: Neurology

## 2017-01-23 IMAGING — DX DG CHEST 1V PORT
1 series · 1 of 1 positions shown · non-contrast
Comparison: Radiographs December 20, 2015.

CLINICAL DATA: Weakness.

EXAM:
PORTABLE CHEST 1 VIEW

[chest ap]
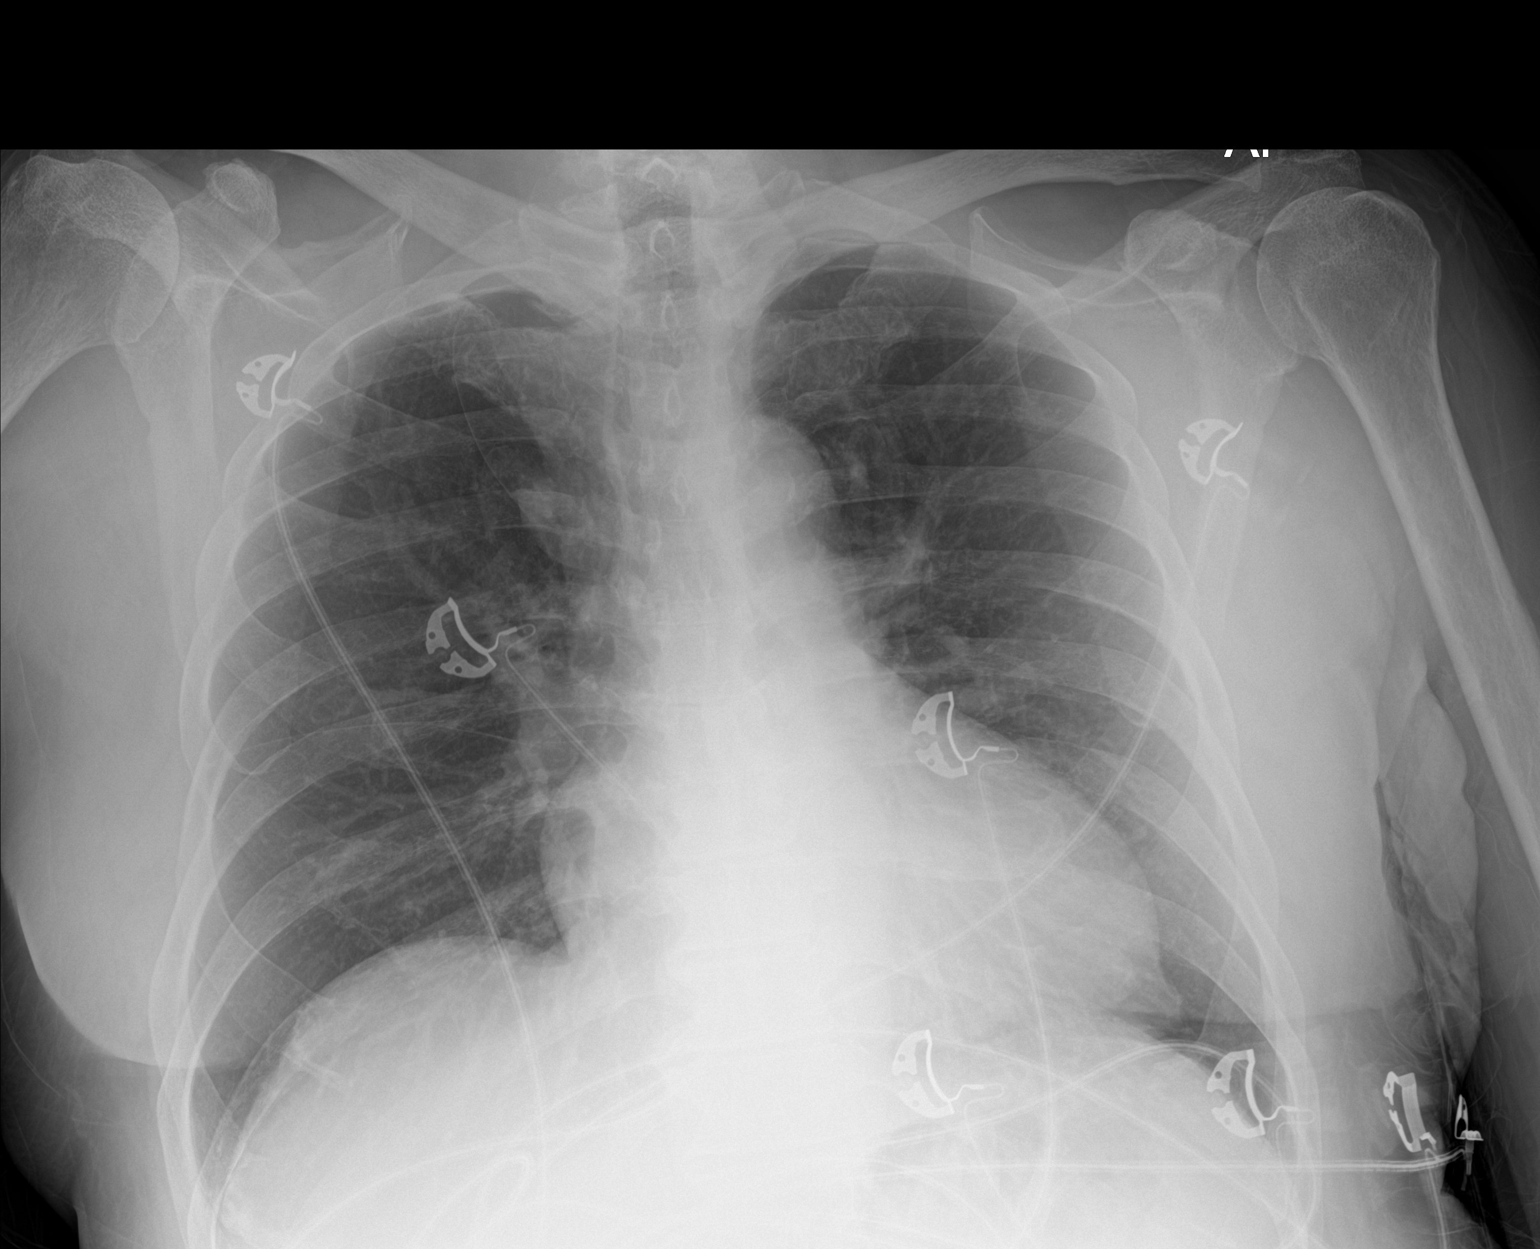

[1 of 1 positions shown; findings below may reference images not displayed]

FINDINGS: The heart size and mediastinal contours are within normal limits.
Both lungs are clear. No pneumothorax or pleural effusion is noted.
The visualized skeletal structures are unremarkable.
IMPRESSION: No acute cardiopulmonary abnormality seen.

## 2017-01-23 NOTE — Telephone Encounter (Signed)
Pt's relative Trinna Postlex called and said they are having a hard time with the pharmacy trying to refill the carbadopa

## 2017-01-23 NOTE — Telephone Encounter (Signed)
Made aware we [facility] [place] [facility] 01/21/17. He will contact them and call back if any issues.

## 2017-02-27 ENCOUNTER — Other Ambulatory Visit: Payer: Self-pay | Admitting: Neurology

## 2017-06-15 ENCOUNTER — Other Ambulatory Visit: Payer: Self-pay | Admitting: Neurology

## 2017-07-11 ENCOUNTER — Inpatient Hospital Stay (HOSPITAL_COMMUNITY)
Admission: EM | Admit: 2017-07-11 | Discharge: 2017-07-22 | DRG: 296 | Disposition: E | Payer: Medicare Other | Attending: Pulmonary Disease | Admitting: Pulmonary Disease

## 2017-07-11 ENCOUNTER — Emergency Department (HOSPITAL_COMMUNITY): Payer: Medicare Other

## 2017-07-11 ENCOUNTER — Inpatient Hospital Stay (HOSPITAL_COMMUNITY): Payer: Medicare Other

## 2017-07-11 DIAGNOSIS — I469 Cardiac arrest, cause unspecified: Secondary | ICD-10-CM | POA: Diagnosis present

## 2017-07-11 DIAGNOSIS — J9602 Acute respiratory failure with hypercapnia: Secondary | ICD-10-CM | POA: Diagnosis present

## 2017-07-11 DIAGNOSIS — E872 Acidosis, unspecified: Secondary | ICD-10-CM | POA: Diagnosis present

## 2017-07-11 DIAGNOSIS — J449 Chronic obstructive pulmonary disease, unspecified: Secondary | ICD-10-CM | POA: Diagnosis present

## 2017-07-11 DIAGNOSIS — N170 Acute kidney failure with tubular necrosis: Secondary | ICD-10-CM | POA: Diagnosis present

## 2017-07-11 DIAGNOSIS — Z87891 Personal history of nicotine dependence: Secondary | ICD-10-CM

## 2017-07-11 DIAGNOSIS — G20 Parkinson's disease: Secondary | ICD-10-CM | POA: Diagnosis present

## 2017-07-11 DIAGNOSIS — Z7982 Long term (current) use of aspirin: Secondary | ICD-10-CM

## 2017-07-11 DIAGNOSIS — I10 Essential (primary) hypertension: Secondary | ICD-10-CM | POA: Diagnosis present

## 2017-07-11 DIAGNOSIS — Z978 Presence of other specified devices: Secondary | ICD-10-CM

## 2017-07-11 DIAGNOSIS — R0600 Dyspnea, unspecified: Secondary | ICD-10-CM | POA: Diagnosis not present

## 2017-07-11 DIAGNOSIS — J9600 Acute respiratory failure, unspecified whether with hypoxia or hypercapnia: Secondary | ICD-10-CM

## 2017-07-11 DIAGNOSIS — R402112 Coma scale, eyes open, never, at arrival to emergency department: Secondary | ICD-10-CM | POA: Diagnosis present

## 2017-07-11 DIAGNOSIS — E039 Hypothyroidism, unspecified: Secondary | ICD-10-CM | POA: Diagnosis present

## 2017-07-11 DIAGNOSIS — N319 Neuromuscular dysfunction of bladder, unspecified: Secondary | ICD-10-CM | POA: Diagnosis present

## 2017-07-11 DIAGNOSIS — J690 Pneumonitis due to inhalation of food and vomit: Secondary | ICD-10-CM | POA: Diagnosis present

## 2017-07-11 DIAGNOSIS — S2239XA Fracture of one rib, unspecified side, initial encounter for closed fracture: Secondary | ICD-10-CM | POA: Diagnosis present

## 2017-07-11 DIAGNOSIS — Z66 Do not resuscitate: Secondary | ICD-10-CM | POA: Diagnosis not present

## 2017-07-11 DIAGNOSIS — R402312 Coma scale, best motor response, none, at arrival to emergency department: Secondary | ICD-10-CM | POA: Diagnosis present

## 2017-07-11 DIAGNOSIS — D649 Anemia, unspecified: Secondary | ICD-10-CM | POA: Diagnosis present

## 2017-07-11 DIAGNOSIS — Z4682 Encounter for fitting and adjustment of non-vascular catheter: Secondary | ICD-10-CM

## 2017-07-11 DIAGNOSIS — R001 Bradycardia, unspecified: Secondary | ICD-10-CM | POA: Diagnosis present

## 2017-07-11 DIAGNOSIS — G931 Anoxic brain damage, not elsewhere classified: Secondary | ICD-10-CM | POA: Diagnosis not present

## 2017-07-11 DIAGNOSIS — Z8249 Family history of ischemic heart disease and other diseases of the circulatory system: Secondary | ICD-10-CM

## 2017-07-11 DIAGNOSIS — R402212 Coma scale, best verbal response, none, at arrival to emergency department: Secondary | ICD-10-CM | POA: Diagnosis present

## 2017-07-11 DIAGNOSIS — S2231XA Fracture of one rib, right side, initial encounter for closed fracture: Secondary | ICD-10-CM | POA: Diagnosis present

## 2017-07-11 DIAGNOSIS — G40A01 Absence epileptic syndrome, not intractable, with status epilepticus: Secondary | ICD-10-CM | POA: Diagnosis present

## 2017-07-11 DIAGNOSIS — G40901 Epilepsy, unspecified, not intractable, with status epilepticus: Secondary | ICD-10-CM | POA: Diagnosis not present

## 2017-07-11 DIAGNOSIS — Z515 Encounter for palliative care: Secondary | ICD-10-CM | POA: Diagnosis not present

## 2017-07-11 DIAGNOSIS — Z96653 Presence of artificial knee joint, bilateral: Secondary | ICD-10-CM | POA: Diagnosis present

## 2017-07-11 DIAGNOSIS — R579 Shock, unspecified: Secondary | ICD-10-CM | POA: Diagnosis present

## 2017-07-11 DIAGNOSIS — Z87440 Personal history of urinary (tract) infections: Secondary | ICD-10-CM

## 2017-07-11 DIAGNOSIS — J982 Interstitial emphysema: Secondary | ICD-10-CM | POA: Diagnosis present

## 2017-07-11 DIAGNOSIS — N179 Acute kidney failure, unspecified: Secondary | ICD-10-CM | POA: Diagnosis present

## 2017-07-11 DIAGNOSIS — Z79890 Hormone replacement therapy (postmenopausal): Secondary | ICD-10-CM

## 2017-07-11 DIAGNOSIS — R739 Hyperglycemia, unspecified: Secondary | ICD-10-CM | POA: Diagnosis present

## 2017-07-11 HISTORY — DX: Anoxic brain damage, not elsewhere classified: G93.1

## 2017-07-11 LAB — URINALYSIS, ROUTINE W REFLEX MICROSCOPIC
BILIRUBIN URINE: NEGATIVE
Glucose, UA: NEGATIVE mg/dL
KETONES UR: 5 mg/dL — AB
Nitrite: NEGATIVE
Protein, ur: 100 mg/dL — AB
SQUAMOUS EPITHELIAL / LPF: NONE SEEN
Specific Gravity, Urine: >1.046 — ABNORMAL HIGH (ref 1.005–1.030)
pH: 5 (ref 5.0–8.0)

## 2017-07-11 LAB — I-STAT ARTERIAL BLOOD GAS, ED
ACID-BASE DEFICIT: 1 mmol/L (ref 0.0–2.0)
Acid-base deficit: 4 mmol/L — ABNORMAL HIGH (ref 0.0–2.0)
Bicarbonate: 26 mmol/L (ref 20.0–28.0)
Bicarbonate: 22.5 mmol/L (ref 20.0–28.0)
O2 SAT: 100 %
O2 Saturation: 94 %
PCO2 ART: 44.6 mmHg (ref 32.0–48.0)
PH ART: 7.289 — AB (ref 7.350–7.450)
PH ART: 7.302 — AB (ref 7.350–7.450)
PO2 ART: 72 mmHg — AB (ref 83.0–108.0)
Patient temperature: 98
Patient temperature: 95.4
TCO2: 28 mmol/L (ref 22–32)
TCO2: 24 mmol/L (ref 22–32)
pCO2 arterial: 54 mmHg — ABNORMAL HIGH (ref 32.0–48.0)
pO2, Arterial: 398 mmHg — ABNORMAL HIGH (ref 83.0–108.0)

## 2017-07-11 LAB — HEPATIC FUNCTION PANEL
ALK PHOS: 54 U/L (ref 38–126)
ALT: 9 U/L — AB (ref 17–63)
AST: 64 U/L — AB (ref 15–41)
Albumin: 2.9 g/dL — ABNORMAL LOW (ref 3.5–5.0)
BILIRUBIN TOTAL: 0.6 mg/dL (ref 0.3–1.2)
Bilirubin, Direct: 0.1 mg/dL (ref 0.1–0.5)
Indirect Bilirubin: 0.5 mg/dL (ref 0.3–0.9)
Total Protein: 5.7 g/dL — ABNORMAL LOW (ref 6.5–8.1)

## 2017-07-11 LAB — CBC WITH DIFFERENTIAL/PLATELET
BASOS ABS: 0 10*3/uL (ref 0.0–0.1)
Basophils Relative: 0 %
EOS ABS: 0.3 10*3/uL (ref 0.0–0.7)
Eosinophils Relative: 2 %
HCT: 30.2 % — ABNORMAL LOW (ref 39.0–52.0)
HEMOGLOBIN: 9.3 g/dL — AB (ref 13.0–17.0)
LYMPHS PCT: 36 %
Lymphs Abs: 6.2 10*3/uL — ABNORMAL HIGH (ref 0.7–4.0)
MCH: 28.9 pg (ref 26.0–34.0)
MCHC: 30.8 g/dL (ref 30.0–36.0)
MCV: 93.8 fL (ref 78.0–100.0)
MONO ABS: 0.5 10*3/uL (ref 0.1–1.0)
Monocytes Relative: 3 %
NEUTROS PCT: 59 %
Neutro Abs: 10.2 10*3/uL — ABNORMAL HIGH (ref 1.7–7.7)
PLATELETS: 188 10*3/uL (ref 150–400)
RBC: 3.22 MIL/uL — AB (ref 4.22–5.81)
RDW: 14 % (ref 11.5–15.5)
WBC: 17.2 10*3/uL — AB (ref 4.0–10.5)

## 2017-07-11 LAB — BASIC METABOLIC PANEL
Anion gap: 16 — ABNORMAL HIGH (ref 5–15)
BUN: 23 mg/dL — AB (ref 6–20)
CHLORIDE: 103 mmol/L (ref 101–111)
CO2: 19 mmol/L — AB (ref 22–32)
CREATININE: 1.66 mg/dL — AB (ref 0.61–1.24)
Calcium: 8.7 mg/dL — ABNORMAL LOW (ref 8.9–10.3)
GFR calc Af Amer: 44 mL/min — ABNORMAL LOW (ref >60)
GFR calc non Af Amer: 38 mL/min — ABNORMAL LOW (ref >60)
Glucose, Bld: 194 mg/dL — ABNORMAL HIGH (ref 65–99)
POTASSIUM: 4.3 mmol/L (ref 3.5–5.1)
SODIUM: 138 mmol/L (ref 135–145)

## 2017-07-11 LAB — I-STAT CHEM 8, ED
BUN: 25 mg/dL — ABNORMAL HIGH (ref 6–20)
CHLORIDE: 102 mmol/L (ref 101–111)
Calcium, Ion: 1.17 mmol/L (ref 1.15–1.40)
Creatinine, Ser: 1.4 mg/dL — ABNORMAL HIGH (ref 0.61–1.24)
GLUCOSE: 188 mg/dL — AB (ref 65–99)
HEMATOCRIT: 27 % — AB (ref 39.0–52.0)
Hemoglobin: 9.2 g/dL — ABNORMAL LOW (ref 13.0–17.0)
POTASSIUM: 4.3 mmol/L (ref 3.5–5.1)
Sodium: 140 mmol/L (ref 135–145)
TCO2: 26 mmol/L (ref 22–32)

## 2017-07-11 LAB — GLUCOSE, CAPILLARY
GLUCOSE-CAPILLARY: 93 mg/dL (ref 65–99)
Glucose-Capillary: 187 mg/dL — ABNORMAL HIGH (ref 65–99)

## 2017-07-11 LAB — PROTIME-INR
INR: 1.08
PROTHROMBIN TIME: 13.9 s (ref 11.4–15.2)

## 2017-07-11 LAB — AMYLASE: Amylase: 194 U/L — ABNORMAL HIGH (ref 28–100)

## 2017-07-11 LAB — I-STAT TROPONIN, ED: TROPONIN I, POC: 0 ng/mL (ref 0.00–0.08)

## 2017-07-11 LAB — CBG MONITORING, ED: GLUCOSE-CAPILLARY: 186 mg/dL — AB (ref 65–99)

## 2017-07-11 LAB — TYPE AND SCREEN
ABO/RH(D): A POS
ANTIBODY SCREEN: NEGATIVE

## 2017-07-11 LAB — ABO/RH: ABO/RH(D): A POS

## 2017-07-11 LAB — I-STAT CG4 LACTIC ACID, ED
LACTIC ACID, VENOUS: 4.81 mmol/L — AB (ref 0.5–1.9)
Lactic Acid, Venous: 7.35 mmol/L (ref 0.5–1.9)

## 2017-07-11 LAB — LACTIC ACID, PLASMA: Lactic Acid, Venous: 2.3 mmol/L (ref 0.5–1.9)

## 2017-07-11 LAB — MRSA PCR SCREENING: MRSA BY PCR: NEGATIVE

## 2017-07-11 LAB — PROCALCITONIN: Procalcitonin: 0.17 ng/mL

## 2017-07-11 LAB — LIPASE, BLOOD: LIPASE: 41 U/L (ref 11–51)

## 2017-07-11 LAB — TROPONIN I: TROPONIN I: 0.12 ng/mL — AB (ref <0.03)

## 2017-07-11 LAB — BRAIN NATRIURETIC PEPTIDE: B NATRIURETIC PEPTIDE 5: 76.6 pg/mL (ref 0.0–100.0)

## 2017-07-11 LAB — CORTISOL: Cortisol, Plasma: 23.9 ug/dL

## 2017-07-11 LAB — TRIGLYCERIDES: TRIGLYCERIDES: 96 mg/dL (ref <150)

## 2017-07-11 MED ORDER — IOPAMIDOL (ISOVUE-370) INJECTION 76%
80.0000 mL | Freq: Once | INTRAVENOUS | Status: AC | PRN
  Administered 2017-07-11: 80 mL via INTRAVENOUS

## 2017-07-11 MED ORDER — PROPOFOL 1000 MG/100ML IV EMUL
0.0000 ug/kg/min | INTRAVENOUS | Status: DC
  Administered 2017-07-11: 5 ug/kg/min via INTRAVENOUS
  Administered 2017-07-12 (×2): 20 ug/kg/min via INTRAVENOUS
  Filled 2017-07-11 (×2): qty 100

## 2017-07-11 MED ORDER — LEVETIRACETAM IN NACL 1500 MG/100ML IV SOLN
1500.0000 mg | Freq: Once | INTRAVENOUS | Status: AC
  Administered 2017-07-11: 1500 mg via INTRAVENOUS
  Filled 2017-07-11: qty 100

## 2017-07-11 MED ORDER — PANTOPRAZOLE SODIUM 40 MG PO PACK
40.0000 mg | PACK | Freq: Every day | ORAL | Status: DC
  Administered 2017-07-12: 40 mg
  Filled 2017-07-11 (×2): qty 20

## 2017-07-11 MED ORDER — IOPAMIDOL (ISOVUE-370) INJECTION 76%
INTRAVENOUS | Status: AC
  Filled 2017-07-11: qty 100

## 2017-07-11 MED ORDER — LORAZEPAM 2 MG/ML IJ SOLN: 2.0000 mg | Freq: Once | INTRAMUSCULAR | Status: AC

## 2017-07-11 MED ORDER — HEPARIN SODIUM (PORCINE) 5000 UNIT/ML IJ SOLN
5000.0000 [IU] | Freq: Three times a day (TID) | INTRAMUSCULAR | Status: DC
  Administered 2017-07-11 – 2017-07-12 (×3): 5000 [IU] via SUBCUTANEOUS
  Filled 2017-07-11 (×5): qty 1

## 2017-07-11 MED ORDER — ALBUTEROL SULFATE (2.5 MG/3ML) 0.083% IN NEBU
2.5000 mg | INHALATION_SOLUTION | RESPIRATORY_TRACT | Status: DC
  Administered 2017-07-11: 2.5 mg via RESPIRATORY_TRACT
  Filled 2017-07-11: qty 3

## 2017-07-11 MED ORDER — LEVETIRACETAM IN NACL 1000 MG/100ML IV SOLN: 1000.0000 mg | Freq: Two times a day (BID) | INTRAVENOUS | Status: DC

## 2017-07-11 MED ORDER — MIDAZOLAM HCL 2 MG/2ML IJ SOLN
INTRAMUSCULAR | Status: AC
  Administered 2017-07-11: 2 mg
  Filled 2017-07-11: qty 2

## 2017-07-11 MED ORDER — FENTANYL CITRATE (PF) 100 MCG/2ML IJ SOLN: 50.0000 ug | INTRAMUSCULAR | Status: DC | PRN

## 2017-07-11 MED ORDER — ACETAMINOPHEN 160 MG/5ML PO SOLN
650.0000 mg | Freq: Four times a day (QID) | ORAL | Status: DC | PRN
Start: 2017-07-11 — End: 2017-07-13
  Administered 2017-07-11: 650 mg
  Filled 2017-07-11: qty 20.3

## 2017-07-11 MED ORDER — SODIUM CHLORIDE 0.9 % IV SOLN: INTRAVENOUS | Status: DC | PRN

## 2017-07-11 MED ORDER — ASPIRIN 81 MG PO CHEW
81.0000 mg | CHEWABLE_TABLET | Freq: Every day | ORAL | Status: DC
  Administered 2017-07-11 – 2017-07-12 (×2): 81 mg via ORAL
  Filled 2017-07-11 (×2): qty 1

## 2017-07-11 MED ORDER — VALPROATE SODIUM 500 MG/5ML IV SOLN
500.0000 mg | Freq: Three times a day (TID) | INTRAVENOUS | Status: DC
  Administered 2017-07-12 (×2): 500 mg via INTRAVENOUS
  Filled 2017-07-11 (×4): qty 5

## 2017-07-11 MED ORDER — FENTANYL 2500MCG IN NS 250ML (10MCG/ML) PREMIX INFUSION
25.0000 ug/h | INTRAVENOUS | Status: DC
  Administered 2017-07-11: 25 ug/h via INTRAVENOUS
  Filled 2017-07-11: qty 250

## 2017-07-11 MED ORDER — VANCOMYCIN HCL 10 G IV SOLR
1500.0000 mg | Freq: Once | INTRAVENOUS | Status: AC
  Administered 2017-07-11: 1500 mg via INTRAVENOUS
  Filled 2017-07-11: qty 1500

## 2017-07-11 MED ORDER — FENTANYL CITRATE (PF) 100 MCG/2ML IJ SOLN
50.0000 ug | INTRAMUSCULAR | Status: DC | PRN
  Administered 2017-07-12: 50 ug via INTRAVENOUS
  Filled 2017-07-11 (×2): qty 2

## 2017-07-11 MED ORDER — VALPROATE SODIUM 500 MG/5ML IV SOLN
2000.0000 mg | Freq: Once | INTRAVENOUS | Status: AC
  Administered 2017-07-11: 2000 mg via INTRAVENOUS
  Filled 2017-07-11: qty 20

## 2017-07-11 MED ORDER — FENTANYL BOLUS VIA INFUSION
25.0000 ug | INTRAVENOUS | Status: DC | PRN
  Filled 2017-07-11: qty 25

## 2017-07-11 MED ORDER — EPINEPHRINE PF 1 MG/ML IJ SOLN
0.5000 ug/min | INTRAVENOUS | Status: DC
  Administered 2017-07-11: 5 ug/min via INTRAVENOUS
  Filled 2017-07-11: qty 4

## 2017-07-11 MED ORDER — LORAZEPAM 2 MG/ML IJ SOLN
INTRAMUSCULAR | Status: AC
  Administered 2017-07-11: 2 mg
  Filled 2017-07-11: qty 1

## 2017-07-11 MED ORDER — MIDAZOLAM HCL 2 MG/2ML IJ SOLN: 1.0000 mg | INTRAMUSCULAR | Status: DC | PRN

## 2017-07-11 MED ORDER — PIPERACILLIN-TAZOBACTAM 3.375 G IVPB
3.3750 g | Freq: Three times a day (TID) | INTRAVENOUS | Status: DC
  Administered 2017-07-11 – 2017-07-12 (×3): 3.375 g via INTRAVENOUS
  Filled 2017-07-11 (×6): qty 50

## 2017-07-11 MED ORDER — ALBUTEROL SULFATE (2.5 MG/3ML) 0.083% IN NEBU: 2.5000 mg | INHALATION_SOLUTION | RESPIRATORY_TRACT | Status: DC | PRN

## 2017-07-11 MED ORDER — SODIUM CHLORIDE 0.9 % IV SOLN
250.0000 mL | INTRAVENOUS | Status: DC | PRN
  Administered 2017-07-11: 250 mL via INTRAVENOUS

## 2017-07-11 MED ORDER — ACETAMINOPHEN 650 MG RE SUPP: 650.0000 mg | Freq: Four times a day (QID) | RECTAL | Status: DC | PRN

## 2017-07-11 MED ORDER — VANCOMYCIN HCL IN DEXTROSE 750-5 MG/150ML-% IV SOLN
750.0000 mg | Freq: Two times a day (BID) | INTRAVENOUS | Status: DC
  Administered 2017-07-12 (×2): 750 mg via INTRAVENOUS
  Filled 2017-07-11 (×3): qty 150

## 2017-07-11 MED ORDER — SODIUM CHLORIDE 0.9 % IV BOLUS
1000.0000 mL | Freq: Once | INTRAVENOUS | Status: AC
  Administered 2017-07-11: 1000 mL via INTRAVENOUS

## 2017-07-11 MED ORDER — PIPERACILLIN-TAZOBACTAM 3.375 G IVPB 30 MIN: 3.3750 g | Freq: Once | INTRAVENOUS | Status: DC

## 2017-07-11 MED ORDER — INSULIN ASPART 100 UNIT/ML ~~LOC~~ SOLN: 1.0000 [IU] | SUBCUTANEOUS | Status: DC

## 2017-07-11 MED ORDER — ACETAMINOPHEN 650 MG RE SUPP: 325.0000 mg | Freq: Four times a day (QID) | RECTAL | Status: DC | PRN

## 2017-07-11 MED ORDER — CARBIDOPA-LEVODOPA 25-100 MG PO TABS
1.0000 | ORAL_TABLET | Freq: Three times a day (TID) | ORAL | Status: DC
Start: 2017-07-11 — End: 2017-07-12
  Administered 2017-07-11 – 2017-07-12 (×2): 1
  Filled 2017-07-11 (×3): qty 1

## 2017-07-11 MED ORDER — FENTANYL CITRATE (PF) 100 MCG/2ML IJ SOLN
50.0000 ug | Freq: Once | INTRAMUSCULAR | Status: AC
  Administered 2017-07-11: 50 ug via INTRAVENOUS

## 2017-07-11 MED ORDER — SODIUM CHLORIDE 0.9 % IV SOLN
750.0000 mg | Freq: Two times a day (BID) | INTRAVENOUS | Status: DC
  Administered 2017-07-12: 750 mg via INTRAVENOUS
  Filled 2017-07-11 (×3): qty 7.5

## 2017-07-11 MED ORDER — IPRATROPIUM-ALBUTEROL 0.5-2.5 (3) MG/3ML IN SOLN
3.0000 mL | RESPIRATORY_TRACT | Status: DC
  Administered 2017-07-11 – 2017-07-12 (×6): 3 mL via RESPIRATORY_TRACT
  Filled 2017-07-11 (×6): qty 3

## 2017-07-11 NOTE — ED Notes (Signed)
Report to [facility] rn on [id number]

## 2017-07-11 NOTE — Progress Notes (Signed)
   06/24/2017 1900  Clinical Encounter Type  Visited With Patient;Patient and family together  Visit Type Initial  Referral From Nurse  Consult/Referral To Chaplain  Spiritual Encounters  Spiritual Needs Emotional  Stress Factors  Patient Stress Factors Exhausted  Family Stress Factors Exhausted    Pt had cardiac arrest and was intubated when I arrived in the ED. Pt's wife and son were on-site in emotional distress. Chaplain had conversations with Pt' wife who said they had been talking to each other while watching TV when she suddenly noticed Pt slum behind chair and got quiet. Chaplain provided emotional support through empathic and reflective listening and compassionate presence.  Ashly Goethe a Water quality scientistMusiko-Hol[place], [employer]. du PontChaplain

## 2017-07-11 NOTE — ED Notes (Addendum)
Weaning from the epin drip

## 2017-07-11 NOTE — ED Notes (Signed)
I Stat Lactic Acid results of 4.81 reported to dr. Charm BargesButler.

## 2017-07-11 NOTE — ED Notes (Signed)
Critical care at the bedside has  Elevated there pts head

## 2017-07-11 NOTE — ED Provider Notes (Signed)
[facility] EMERGENCY DEPARTMENT Provider Note   CSN: [id number] Arrival date & time: 07/02/2017  1323     History   Chief Complaint No chief complaint on file.   HPI Bryan Vega is a 77 y.o. male.  Here as by EMS after sustaining a cardiac arrest at home.  Per the report he was in his usual state of health today and family witnessed him immediately collapsed while talking.  He received CPR by family and then fire department catheter continued CPR and no shock advised on the AED.  When paramedics got there they intubated the patient found him in asystole continued CPR and epi with return to sinus tachycardia with pulses.  He eventually bradyed down and they started CPR and put transcutaneous pacer pads on him and he arrives to the emergency department being transcutaneously paced.  The history is provided by the EMS personnel.  Cardiac Arrest  Witnessed by:  Family member Incident location:  Home Time since incident:  1 hour Time before BLS initiated:  Immediate Time before ALS initiated:  > 10 minutes Condition upon EMS arrival:  Apneic and unresponsive Pulse:  Absent Initial cardiac rhythm per EMS:  Asystole Treatments prior to arrival:  CCR Airway:  Intubation prior to arrival Rhythm on admission to ED:  Sinus tachycardia Associated symptoms: loss of consciousness and syncope   Associated symptoms: no chest pain   Risk factors: no drug overdose, no heart problem and no trauma     Past Medical History:  Diagnosis Date  . Bacteria in urine 07/16/2016  . COPD (chronic obstructive pulmonary disease) (HCC)   . Hyperlipidemia   . Hypertension   . Hypothyroidism   . Neurogenic bladder   . 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 Parkinson's disease (HCC)   . 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 UTI (urinary tract infection) 11/09/2016  . Acute lower UTI 08/08/2016  . 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 Bacteria  in urine 07/16/2016  . 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 Pyelonephritis, acute 06/24/2016  . 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 Hyponatremia 01/21/2016  . Anemia 01/21/2016  . Infection due to urethral catheter (HCC)   . Sepsis secondary to UTI (HCC) 01/20/2016  . Hyperlipidemia 01/20/2016  . Hypothyroidism 01/20/2016  . Hypotension 12/20/2015  . Lower urinary tract infectious disease 12/20/2015  . Generalized weakness 12/20/2015  . Parkinson disease (HCC) 12/20/2015  . Neurogenic bladder 12/20/2015    Past Surgical History:  Procedure Laterality Date  . APPENDECTOMY    . JOINT REPLACEMENT    . 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 Authorizing Provider  albuterol (PROVENTIL HFA;VENTOLIN HFA) 108 (90 Base) MCG/ACT inhaler Inhale 2 puffs into the lungs 4 (four) times daily as needed for wheezing or shortness of breath. 04/01/16 04/01/17  [provider]  aspirin EC 81 MG tablet Take 81 mg by mouth daily.    [provider]  atorvastatin (LIPITOR) 20 MG tablet Take 20 mg by mouth at bedtime.     [provider]  CALCIUM PO Take 1 tablet by mouth daily.    [provider]  carbidopa-levodopa (SINEMET CR) 50-200 MG tablet TAKE 1 TABLET BY MOUTH AT BEDTIME 01/21/17   Tat, Rebecca S, DO  carbidopa-levodopa (SINEMET IR) 25-100 MG tablet Take 1.5 tablets  by mouth 6 (six) times daily.     [provider]  citalopram (CELEXA) 20 MG tablet Take 0.5 tablets (10 mg total) by mouth at bedtime. 06/26/16   Lenox Ponds, MD  docusate sodium (COLACE) 100 MG capsule Take 100 mg by mouth daily.     [provider]  ferrous sulfate 325 (65 FE) MG tablet Take 325 mg by mouth daily with breakfast.    [provider]  gabapentin (NEURONTIN) 100 MG capsule TAKE 2 CAPSULES BY MOUTH ONCE DAILY AFTER SUPPER 03/04/17   Tat,  Octaviano Batty, DO  levothyroxine (SYNTHROID, LEVOTHROID) 88 MCG tablet Take 88 mcg by mouth daily before breakfast.    [provider]  Omega-3 Fatty Acids (FISH OIL PO) Take 1 capsule by mouth daily.    [provider]  omeprazole (PRILOSEC) 20 MG capsule Take 20 mg by mouth daily.    [provider]  polyethylene glycol (MIRALAX / GLYCOLAX) packet Take 17 g by mouth daily as needed for mild constipation. 11/11/16   Rolly Salter, MD  saccharomyces boulardii (FLORASTOR) 250 MG capsule Take 1 capsule (250 mg total) by mouth 2 (two) times daily. 11/11/16   Rolly Salter, MD    Family History Family History  Problem Relation Age of Onset  . Heart attack Brother     Social History Social History   Tobacco Use  . Smoking status: Former Smoker    Packs/day: 0.50    Years: 20.00    Pack years: 10.00    Types: Cigarettes    Last attempt to quit: 04/20/1996    Years since quitting: 21.2  . Smokeless tobacco: Never Used  Substance Use Topics  . Alcohol use: No  . Drug use: No     Allergies   Patient has no known allergies.   Review of Systems Review of Systems  Unable to perform ROS: Intubated  Cardiovascular: Positive for syncope. Negative for chest pain.  Neurological: Positive for loss of consciousness.     Physical Exam Updated Vital Signs BP (!) 121/52   Pulse 72   Resp 18   SpO2 100%   Physical Exam  Constitutional: He appears well-developed and well-nourished.  HENT:  Head: Normocephalic and atraumatic.  Right Ear: External ear normal.  Left Ear: External ear normal.  Orally intubated.  Some dried vomit on the face.  Pupils are mid and fixed.  Eyes: Conjunctivae are normal. No scleral icterus.  Neck:  Patient in c-collar, trachea midline.  Cardiovascular: Normal rate and regular rhythm.  Pulmonary/Chest:  Decreased breath sounds left greater than right.  ET tube adjusted with improvement.  Coarse rhonchi throughout.  Abdominal:  Soft. He exhibits no mass. There is no tenderness. There is no guarding.  Musculoskeletal: Normal range of motion. He exhibits no tenderness or deformity.  Neurological: He is unresponsive. GCS eye subscore is 1. GCS motor subscore is 1.  Patient was unresponsive.  Has an intermittent cough and some intermittent spasms that involve all 4 extremities.  Skin: Skin is warm and dry. No rash noted. He is not diaphoretic.     ED Treatments / Results  Labs (all labs ordered are listed, but only abnormal results are displayed) Labs Reviewed  BASIC METABOLIC PANEL - Abnormal; Notable for the following components:      Result Value   CO2 19 (*)    Glucose, Bld 194 (*)    BUN 23 (*)    Creatinine, Ser 1.66 (*)  Calcium 8.7 (*)    GFR calc non Af Amer 38 (*)    GFR calc Af Amer 44 (*)    Anion gap 16 (*)    All other components within normal limits  CBC WITH DIFFERENTIAL/PLATELET - Abnormal; Notable for the following components:   WBC 17.2 (*)    RBC 3.22 (*)    Hemoglobin 9.3 (*)    HCT 30.2 (*)    Neutro Abs 10.2 (*)    Lymphs Abs 6.2 (*)    All other components within normal limits  GLUCOSE, CAPILLARY - Abnormal; Notable for the following components:   Glucose-Capillary 187 (*)    All other components within normal limits  I-STAT CHEM 8, ED - Abnormal; Notable for the following components:   BUN 25 (*)    Creatinine, Ser 1.40 (*)    Glucose, Bld 188 (*)    Hemoglobin 9.2 (*)    HCT 27.0 (*)    All other components within normal limits  I-STAT CG4 LACTIC ACID, ED - Abnormal; Notable for the following components:   Lactic Acid, Venous 7.35 (*)    All other components within normal limits  CBG MONITORING, ED - Abnormal; Notable for the following components:   Glucose-Capillary 186 (*)    All other components within normal limits  I-STAT ARTERIAL BLOOD GAS, ED - Abnormal; Notable for the following components:   pH, Arterial 7.289 (*)    pCO2 arterial 54.0 (*)    pO2, Arterial  398.0 (*)    All other components within normal limits  I-STAT CG4 LACTIC ACID, ED - Abnormal; Notable for the following components:   Lactic Acid, Venous 4.81 (*)    All other components within normal limits  I-STAT ARTERIAL BLOOD GAS, ED - Abnormal; Notable for the following components:   pH, Arterial 7.302 (*)    pO2, Arterial 72.0 (*)    Acid-base deficit 4.0 (*)    All other components within normal limits  CULTURE, RESPIRATORY (NON-EXPECTORATED)  CULTURE, RESPIRATORY (NON-EXPECTORATED)  MRSA PCR SCREENING  PROTIME-INR  AMYLASE  LIPASE, BLOOD  BRAIN NATRIURETIC PEPTIDE  PROCALCITONIN  CORTISOL  CBC  BASIC METABOLIC PANEL  BLOOD GAS, ARTERIAL  MAGNESIUM  PHOSPHORUS  I-STAT TROPONIN, ED  TYPE AND SCREEN  ABO/RH    EKG EKG Interpretation  Date/Time:  Saturday July 11 2017 13:28:13 EDT Ventricular Rate:  79 PR Interval:    QRS Duration: 86 QT Interval:  437 QTC Calculation: 501 R Axis:   -19 Text Interpretation:  Sinus rhythm Borderline prolonged PR interval Borderline left axis deviation Abnormal R-wave progression, early transition Prolonged QT interval Confirmed by Meridee ScoreButler, Michael [phone]([id number]) on 07/03/2017 2:45:41 PM   Radiology Dg Chest Portable 1 View  Result Date: 07/17/2017 CLINICAL DATA:  Check OG placement EXAM: PORTABLE CHEST 1 VIEW COMPARISON:  Film from earlier in the same day. FINDINGS: Cardiac shadow is stable. Endotracheal tube and gastric catheter are noted in satisfactory position. Lungs are clear without focal infiltrate or effusion. Some persistent subcutaneous emphysema is noted in the region of the left shoulder. Right seventh rib fracture is noted somewhat better appreciated on the current exam. IMPRESSION: Gastric catheter in satisfactory position. Right rib fracture somewhat better seen on the current exam. Stable subcutaneous emphysema about the left shoulder. This may be related to the recent arrest. Electronically Signed   By: Alcide CleverMark  Lukens M.D.    On: 07/01/2017 14:55   Dg Chest Portable 1 View  Result Date: 07/14/2017 CLINICAL DATA:  Check endotracheal tube placement EXAM: PORTABLE CHEST 1 VIEW COMPARISON:  11/09/2016 FINDINGS: Cardiac shadow is within normal limits. Endotracheal tube is noted in satisfactory position 2.7 cm above the carina. Azygos lobe is noted. No focal infiltrate or sizable effusion is seen. No acute bony abnormality is seen. Some subcutaneous emphysema is noted in the region of the left clavicle and left shoulder. This is of uncertain significance. No pneumothorax is seen. IMPRESSION: Endotracheal tube as described. Mild subcutaneous emphysema in the region of the left shoulder without definitive pneumothorax or other abnormality. This is of uncertain significance. Continued follow-up is recommended. Electronically Signed   By: Alcide Clever M.D.   On: 07/10/2017 14:40    Procedures .Critical Care Performed by: Terrilee Files, MD Authorized by: Terrilee Files, MD   Critical care provider statement:    Critical care time (minutes):  45   Critical care time was exclusive of:  Separately billable procedures and treating other patients   Critical care was necessary to treat or prevent imminent or life-threatening deterioration of the following conditions:  Cardiac failure, circulatory failure, CNS failure or compromise and respiratory failure   Critical care was time spent personally by me on the following activities:  Development of treatment plan with patient or surrogate, discussions with consultants, evaluation of patient's response to treatment, examination of patient, obtaining history from patient or surrogate, ordering and performing treatments and interventions, ordering and review of laboratory studies, ordering and review of radiographic studies, pulse oximetry, re-evaluation of patient's condition and review of old charts   I assumed direction of critical care for this patient from another provider in my  specialty: no     (including critical care time)  Medications Ordered in ED Medications  EPINEPHrine (ADRENALIN) 4 mg in dextrose 5 % 250 mL (0.016 mg/mL) infusion (5 mcg/min Intravenous New Bag/Given 07/14/2017 1330)  fentaNYL (SUBLIMAZE) injection 50 mcg (has no administration in time range)  fentaNYL in NS (8mcg/ml) infusion-PREMIX (has no administration in time range)  fentaNYL (SUBLIMAZE) bolus via infusion 25 mcg (has no administration in time range)  midazolam (VERSED) injection 1 mg (has no administration in time range)  midazolam (VERSED) injection 1 mg (has no administration in time range)  midazolam (VERSED) 2 MG/2ML injection (has no administration in time range)     Initial Impression / Assessment and Plan / ED Course  I have reviewed the triage vital signs and the nursing notes.  Pertinent labs & imaging results that were available during my care of the patient were reviewed by me and considered in my medical decision making (see chart for details).  Clinical Course as of Jul 11 1452  Sat Jul 11, 2017  1346 Patient was seen by cardiology who recurrent does not have any indications to go to the Cath Lab.  They will follow along.   [MB]  1425 Discussed with intensivist who will evaluate the patient in the ED.   [MB]    Clinical Course User Index [MB] Terrilee Files, MD   Final Clinical Impressions(s) / ED Diagnoses   Final diagnoses:  Cardiac arrest [facility])    ED Discharge Orders    None       Terrilee Files, MD 07/15/2017 [phone]

## 2017-07-11 NOTE — Progress Notes (Signed)
CRITICAL VALUE ALERT  Critical Value:  LA=2.3, troponin=0.12  Date & Time Notied:  07/02/2017 2236  Provider Notified: Pola CornELINK  Orders Received/Actions taken: 1,000L NS bolus

## 2017-07-11 NOTE — Consult Note (Signed)
Cardiology Consultation:   Patient ID: Bryan Vega; [id number]; 14-Apr-1940   Admit date: 07/09/2017 Date of Consult: 07/14/2017  Primary Care Provider: Cheron Schaumann., MD Primary Cardiologist: na    Patient Profile:   Bryan Vega is a 77 y.o. male with a hx of Parkinsons diease who is being seen today for the evaluation of cardaic arrest at the request of Dr Ardeth Perfect.  History of Present Illness:   Mr. Barmore  History of COPD, HL, HTN, parkinsons disease admit post witnessed outpatient cardiac arrest. History is collected from the chart  Patient with witnessed arrest while at home with family. Family initiated CPR, subsequently followed by fire and rescue. The first rhythm reported was asytole. Unknown down time. Patient recovered pulse but significantly bradycardic, required temporary transcutaneous pacing. Intubated, started on epi drip in ER. His EKG off pacer is normal sinus rhythm, no ischemic changes. There were no apparent prior symptoms leading to the episode.    K 4.3 Cr 1.66 WBC 17.2 Hgb 9.3 INR 1 Lactic acid 7.3 7.29/54/398 istat trop neg  CXR: Mild subcutaneous emphysema in the region of the left shoulder without definitive pneumothorax or other abnormality. This is of uncertain significance. Continued follow-up is recommended  Post arrest EKG off pacer: NSR, no ischemic changes Past Medical History:  Diagnosis Date  . Bacteria in urine 07/16/2016  . COPD (chronic obstructive pulmonary disease) (HCC)   . Hyperlipidemia   . Hypertension   . Hypothyroidism   . Neurogenic bladder   . 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 Parkinson's disease (HCC)   . 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 APPENDECTOMY    . JOINT REPLACEMENT    . 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 albuterol  2.5 mg Nebulization Q4H  . heparin  5,000 Units Subcutaneous Q8H  .  pantoprazole sodium  40 mg Per Tube Daily   Continuous Infusions: . sodium chloride    . sodium chloride    . epinephrine Stopped (07/18/2017 1634)  . fentaNYL infusion INTRAVENOUS Stopped (06/30/2017 1635)  . piperacillin-tazobactam    . piperacillin-tazobactam (ZOSYN)  IV     PRN Meds: Place/Maintain arterial line **AND** sodium chloride, sodium chloride, albuterol, fentaNYL, midazolam, midazolam  Allergies:   No Known Allergies  Social History:   Social History   Socioeconomic History  . Marital status: Married    Spouse name: Not on file  . Number of children: Not on file  . Years of education: Not on file  . Highest education level: Not on file  Occupational History  . Not on file  Social Needs  . Financial resource strain: Not on file  . Food insecurity:    Worry: Not on file    Inability: Not on file  . Transportation needs:    Medical: Not on file    Non-medical: Not on file  Tobacco Use  . Smoking status: Former Smoker    Packs/day: 0.50    Years: 20.00    Pack years: 10.00    Types: Cigarettes    Last attempt to quit: 04/20/1996    Years since quitting: 21.2  . Smokeless tobacco: Never Used  Substance and Sexual Activity  . Alcohol use: No  . Drug use: No  . Sexual activity: Never  Lifestyle  . Physical activity:    Days per week: Not on file    Minutes per session: Not  on file  . Stress: Not on file  Relationships  . Social connections:    Talks on phone: Not on file    Gets together: Not on file    Attends religious service: Not on file    Active member of club or organization: Not on file    Attends meetings of clubs or organizations: Not on file    Relationship status: Not on file  . Intimate partner violence:    Fear of current or ex partner: Not on file    Emotionally abused: Not on file    Physically abused: Not on file    Forced sexual activity: Not on file  Other Topics Concern  . Not on file  Social History Narrative  . Not on file     Family History:    Family History  Problem Relation Age of Onset  . Heart attack Brother      ROS:  Unable to collect, patient intubated and sedated     Physical Exam/Data:   Vitals:   09/28/2017 1530 09/28/2017 1600 09/28/2017 1630 09/28/2017 1649  BP:      Pulse: 64 67 72   Resp: (!) 9 20 20    SpO2: 99% 98% 97%   Weight:    169 lb 5 oz (76.8 kg)  Height:    5\' 9"  (1.753 m)   No intake or output data in the 24 hours ending 09/28/2017 1650 Filed Weights   09/28/2017 1649  Weight: 169 lb 5 oz (76.8 kg)   Body mass index is 25 kg/m.  General:  sedated HEENT: normal Lymph: no adenopathy Neck: no JVD Endocrine:  No thryomegaly Cardiac:  RRR, no m/r/g Lungs:  Coarse bilaterally Abd: soft, nontender, no hepatomegaly  Ext: no edema Musculoskeletal:  No deformities, BUE and BLE strength normal and equal Skin: warm and dry  Neuro:  Sedated, cannot eval Psych:  Sedated, cannot eval   Laboratory Data:  Chemistry Recent Labs  Lab 09/28/2017 1339 09/28/2017 1344  NA 138 140  K 4.3 4.3  CL 103 102  CO2 19*  --   GLUCOSE 194* 188*  BUN 23* 25*  CREATININE 1.66* 1.40*  CALCIUM 8.7*  --   GFRNONAA 38*  --   GFRAA 44*  --   ANIONGAP 16*  --     No results for input(s): PROT, ALBUMIN, AST, ALT, ALKPHOS, BILITOT in the last 168 hours. Hematology Recent Labs  Lab 09/28/2017 1339 09/28/2017 1344  WBC 17.2*  --   RBC 3.22*  --   HGB 9.3* 9.2*  HCT 30.2* 27.0*  MCV 93.8  --   MCH 28.9  --   MCHC 30.8  --   RDW 14.0  --   PLT 188  --    Cardiac EnzymesNo results for input(s): TROPONINI in the last 168 hours.  Recent Labs  Lab 09/28/2017 1343  TROPIPOC 0.00    BNPNo results for input(s): BNP, PROBNP in the last 168 hours.  DDimer No results for input(s): DDIMER in the last 168 hours.  Radiology/Studies:  Dg Chest Portable 1 View  Result Date: 2017/06/17 CLINICAL DATA:  Check OG placement EXAM: PORTABLE CHEST 1 VIEW COMPARISON:  Film from earlier in the same day. FINDINGS:  Cardiac shadow is stable. Endotracheal tube and gastric catheter are noted in satisfactory position. Lungs are clear without focal infiltrate or effusion. Some persistent subcutaneous emphysema is noted in the region of the left shoulder. Right seventh rib fracture is noted somewhat better appreciated  on the current exam. IMPRESSION: Gastric catheter in satisfactory position. Right rib fracture somewhat better seen on the current exam. Stable subcutaneous emphysema about the left shoulder. This may be related to the recent arrest. Electronically Signed   By: Alcide Clever M.D.   On: 2017/08/09 14:55   Dg Chest Portable 1 View  Result Date: 08-09-2017 CLINICAL DATA:  Check endotracheal tube placement EXAM: PORTABLE CHEST 1 VIEW COMPARISON:  11/09/2016 FINDINGS: Cardiac shadow is within normal limits. Endotracheal tube is noted in satisfactory position 2.7 cm above the carina. Azygos lobe is noted. No focal infiltrate or sizable effusion is seen. No acute bony abnormality is seen. Some subcutaneous emphysema is noted in the region of the left clavicle and left shoulder. This is of uncertain significance. No pneumothorax is seen. IMPRESSION: Endotracheal tube as described. Mild subcutaneous emphysema in the region of the left shoulder without definitive pneumothorax or other abnormality. This is of uncertain significance. Continued follow-up is recommended. Electronically Signed   By: Alcide Clever M.D.   On: 08/09/17 14:40    Assessment and Plan:   1. Cardiac arrest - at this time unclear etiology - rhythm per report only known later into arrest reported as asystole - his EKG after resuscitation shows NSR, no ischemic changes - at this time available evidence does not strongly support a primary cardiac cause - we will follow enzymes, EKG, echo - consider ischemic testing pending his neurological status after prolonged arrest   2. Respiratory failure - vent management per ICU team      For  questions or updates, please contact [facility] Please consult [email or web address] for contact info under Cardiology/STEMI.   Joanie Coddington, MD  Aug 09, 2017 4:50 PM

## 2017-07-11 NOTE — Consult Note (Signed)
Neurology Consultation Reason for Consult: Abnormal movements Referring Physician: Janyth ContesEubanks, K  CC: Abnormal movements  History is obtained from: Chart review, family  HPI: Bryan Vega is a 77 y.o. male with a history of Parkinson's disease who presented with cardiac arrest earlier today.  Around noon, patient is wife saw him slumped over the couch.  The patient's son was at home and immediately started CPR.  The son got a pulse back, but apparently it was lost again as EMS intubated the patient and continued CPR.  He then had return to sinus tachycardia with pulses.   ROS: A 14 point ROS was performed and is negative except as noted in the HPI.   Past Medical History:  Diagnosis Date  . Bacteria in urine 07/16/2016  . COPD (chronic obstructive pulmonary disease) (HCC)   . Hyperlipidemia   . Hypertension   . Hypothyroidism   . Neurogenic bladder   . 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 Parkinson's disease (HCC)   . 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 Heart attack Brother      Social History:  reports that he quit smoking about 21 years ago. His smoking use included cigarettes. He has a 10.00 pack-year smoking history. He has never used smokeless tobacco. He reports that he does not drink alcohol or use drugs.   Exam: Current vital signs: BP 135/71   Pulse 96   Resp (!) 31   Ht 5\' 9"  (1.753 m)   Wt 76.8 kg (169 lb 5 oz)   SpO2 98%   BMI 25.00 kg/m  Vital signs in last 24 hours: Pulse Rate:  [60-96] 96 (04/20 2027) Resp:  [9-31] 31 (04/20 2027) BP: (55-156)/(35-71) 135/71 (04/20 1830) SpO2:  [96 %-100 %] 98 % (04/20 2027) Arterial Line BP: (105-189)/(41-69) 189/69 (04/20 1830) FiO2 (%):  [50 %] 50 % (04/20 2027) Weight:  [76.8 kg (169 lb 5 oz)] 76.8 kg (169 lb 5 oz) (04/20 1649)   Physical Exam  Constitutional: Appears well-developed and well-nourished.  Psych: Unresponsive Eyes: No scleral injection HENT: ET tube in  place Head: Normocephalic.  Cardiovascular: Normal rate and regular rhythm.  Respiratory: Effort normal, non-labored breathing GI: Soft.  No distension. There is no tenderness.  Skin: WDI  Neuro: Mental Status: Patient is comatose, eyes do not open except with myoclonic jerks. Cranial Nerves: II: Does not blink to threat pupils are equal, round, and reactive to light.   III,IV, VI: Eyes are disconjugate with the right eye hourly deviated compared to the left V: VII: Corneals present on the left, absent on the right X: Cough is absent Motor: He has increased tone and extensor postures bilaterally to any noxious stimulation. Sensory: As above  Cerebellar: Does not perform  He has intermittent generalized myoclonic jerks.  These are precipitated by stimulation.  I have reviewed labs in epic and the results pertinent to this consultation are: Borderline elevated creatinine at 1.66, elevated glucose at 194, normal sodium, normal potassium  I have reviewed the images obtained: CT head- possible early changes of hypoxic ischemic injury  Impression: 77 year old male with frequent generalized myoclonus following cardiac arrest.  In this setting, these clinical movements developing so early are highly correlated with a poor outcome.  At this point, I would favor treating him with Depakote and Keppra, however I do not think proceeding to general anesthesia would be indicated even if this does represent myoclonic status epilepticus.  Recommendations: 1) EEG  2) Keppra 1 g twice daily, Depakote 500 3 times daily 3) propofol can suppress the movements, though does not improve outcome.  This could be used in the short-term. 4) neurology will continue to follow   Ritta Slot, MD [facility] [phone]  If 7pm- 7am, please page neurology on call as listed in AMION.

## 2017-07-11 NOTE — ED Notes (Signed)
Critical care at the bedside. 

## 2017-07-11 NOTE — ED Notes (Signed)
Dr.Butler made aware of pt lactic acid results. Ed-lab

## 2017-07-11 NOTE — ED Notes (Signed)
Critical care has taken the pt off fentanyl epi drip just stopped

## 2017-07-11 NOTE — Progress Notes (Signed)
Full consult to follow. Patient reportedly at home with family, suddenlty became unresponsive and lost his pulse. CPD done by family, later by fire/rescue. From verbal report at time of rhythm check was asystole. In ER patient temporarily paced transcutaneous, now off. His EKG shows NSR, no ischemic changes. At this time unclear cause of his arrest, from preliminary information it is not clear cardiac is the etiology. Recommend admission to critical care, we will follow up initial workup and if evidence begins to support cardiac primary etiology consider cath at that time. Pressors to maintain bp, vent management per critical care. Time down unlcear at this time. We will follow.    Dina RichJonathan Aine Strycharz MD

## 2017-07-11 NOTE — Progress Notes (Signed)
[facility] notified of patients family wishing to speak with MD. Will continue to provide emotional support to family

## 2017-07-11 NOTE — H&P (Signed)
HISTORY & PHYSICAL  Patient Name: Bryan Vega MRN: [id number] DOB: 06-10-1940    ADMISSION DATE:  06/25/2017 DATE OF SERVICE:  06/22/2017  REFERRING MD:  Meridee Score, MD  CHIEF COMPLAINT: Cardiac arrest   HISTORY OF PRESENT ILLNESS  This 77 y.o. Asian [place] male reformed smoker presented to the [facility] via EMS with cardiac arrest.  The patient experienced a witnessed cardiac arrest at home.  The patient's wife saw the patient slumped over in the couch.  The patient's son Building control surveyor) was at home and immediately came to his side and initiated CPR.  911 was called.  The patient's son reports he was on successful in retrieving achieving Rascoe.  EMS intubated the patient and continued CPR.  In the emergency department, the patient demonstrated sinus tachycardia which degraded to bradycardia arrhythmia.  He was started on epinephrine infusion.  At the time of clinical interview, the patient is intubated and on mechanical ventilatory support (synchronous with the ventilator despite acid pH).  He is on fentanyl infusion.  Although the patient's wife and son were at home at the time of his arrest, no inciting event was witnessed.  EMS did report obvious signs of emesis.  REVIEW OF SYSTEMS This patient is critically ill and cannot provide additional history nor review of systems due to Unconsciousness and Ventilated.   PAST MEDICAL/SURGICAL/SOCIAL/FAMILY HISTORIES   Past Medical History:  Diagnosis Date  . Bacteria in urine 07/16/2016  . COPD (chronic obstructive pulmonary disease) (HCC)   . Hyperlipidemia   . Hypertension   . Hypothyroidism   . Neurogenic bladder   . 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 Parkinson's disease (HCC)   . 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 APPENDECTOMY    . JOINT REPLACEMENT    . 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 Smoking status: Former Smoker    Packs/day: 0.50    Years: 20.00    Pack years: 10.00    Types: Cigarettes    Last attempt to quit: 04/20/1996    Years since quitting: 21.2  . Smokeless tobacco: Never Used  Substance Use Topics  . Alcohol use: No    Family History  Problem Relation Age of Onset  . Heart attack Brother     No Known Allergies   Prior to Admission medications   Medication Sig Start Date End Date Taking? Authorizing Provider  albuterol (PROVENTIL HFA;VENTOLIN HFA) 108 (90 Base) MCG/ACT inhaler Inhale 2 puffs into the lungs 4 (four) times daily as needed for wheezing or shortness of breath. 04/01/16 07/04/2017 Yes [provider]  aspirin EC 81 MG tablet Take 81 mg by mouth daily.   Yes [provider]  atorvastatin (LIPITOR) 20 MG tablet Take 20 mg by mouth at bedtime.    Yes [provider]  CALCIUM PO Take 1 tablet by mouth daily.   Yes [provider]  carbidopa-levodopa (SINEMET IR) 25-100 MG tablet Take 2.5 tablets by mouth 4 (four) times daily.    Yes [provider]  citalopram (CELEXA) 20 MG tablet Take 0.5 tablets (10 mg total) by mouth at bedtime. Patient taking differently: Take 20 mg by mouth at bedtime.  06/26/16  Yes Randel Pigg, Dorma Russell, MD  docusate sodium (COLACE) 100 MG capsule Take 100 mg by mouth daily.    Yes [provider]  ferrous sulfate 325 (65 FE) MG tablet Take 325 mg by mouth daily with breakfast.   Yes [provider]  gabapentin (NEURONTIN) 100 MG capsule TAKE 2 CAPSULES BY MOUTH ONCE DAILY AFTER SUPPER Patient taking differently: TAKE 2 CAPSULES BY MOUTH TWICE DAILY 03/04/17  Yes Tat, Octaviano Batty, DO  levothyroxine (SYNTHROID, LEVOTHROID) 50 MCG tablet Take 50 mcg by mouth daily before breakfast.    Yes [provider]  Omega-3 Fatty Acids (FISH OIL PO) Take 1 capsule by mouth daily.   Yes [provider]  omeprazole (PRILOSEC)  20 MG capsule Take 20 mg by mouth daily.   Yes [provider]  polyethylene glycol (MIRALAX / GLYCOLAX) packet Take 17 g by mouth daily as needed for mild constipation. 11/11/16  Yes Rolly Salter, MD  rOPINIRole (REQUIP) 0.5 MG tablet Take 1-1.5 mg by mouth See admin instructions. Take 2 tablets (1mg ) at 1800 and take 3 tablets (1.5mg ) at bedtime.   Yes [provider]  tamsulosin (FLOMAX) 0.4 MG CAPS capsule Take 0.4 mg by mouth daily.   Yes [provider]    Current Facility-Administered Medications  Medication Dose Route Frequency Provider Last Rate Last Dose  . 0.9 %  sodium chloride infusion   Intra-arterial PRN Lennette Bihari, MD      . EPINEPHrine (ADRENALIN) 4 mg in dextrose 5 % 250 mL (0.016 mg/mL) infusion  0.5-20 mcg/min Intravenous Titrated Terrilee Files, MD 7.5 mL/hr at 06/30/2017 1548 2 mcg/min at 06/26/2017 1548  . fentaNYL (SUBLIMAZE) bolus via infusion 25 mcg  25 mcg Intravenous Q1H PRN Terrilee Files, MD      . fentaNYL in NS (39mcg/ml) infusion-PREMIX  25-400 mcg/hr Intravenous Continuous Terrilee Files, MD 2.5 mL/hr at 07/17/2017 1405 25 mcg/hr at 07/06/2017 1405  . midazolam (VERSED) injection 1 mg  1 mg Intravenous Q15 min PRN Terrilee Files, MD      . midazolam (VERSED) injection 1 mg  1 mg Intravenous Q2H PRN Terrilee Files, MD       Current Outpatient Medications  Medication Sig Dispense Refill  . albuterol (PROVENTIL HFA;VENTOLIN HFA) 108 (90 Base) MCG/ACT inhaler Inhale 2 puffs into the lungs 4 (four) times daily as needed for wheezing or shortness of breath.    Marland Kitchen aspirin EC 81 MG tablet Take 81 mg by mouth daily.    Marland Kitchen atorvastatin (LIPITOR) 20 MG tablet Take 20 mg by mouth at bedtime.     Marland Kitchen CALCIUM PO Take 1 tablet by mouth daily.    . carbidopa-levodopa (SINEMET IR) 25-100 MG tablet Take 2.5 tablets by mouth 4 (four) times daily.     . citalopram (CELEXA) 20 MG tablet Take 0.5 tablets (10 mg total) by mouth  at bedtime. (Patient taking differently: Take 20 mg by mouth at bedtime. )    . docusate sodium (COLACE) 100 MG capsule Take 100 mg by mouth daily.     . ferrous sulfate 325 (65 FE) MG tablet Take 325 mg by mouth daily with breakfast.    . gabapentin (NEURONTIN) 100 MG capsule TAKE 2 CAPSULES BY MOUTH ONCE DAILY AFTER SUPPER (Patient taking differently: TAKE 2 CAPSULES BY MOUTH TWICE DAILY) 180 capsule 0  . levothyroxine (SYNTHROID, LEVOTHROID) 50 MCG tablet Take 50 mcg by mouth daily before breakfast.     . Omega-3 Fatty Acids (FISH OIL PO) Take 1 capsule by mouth daily.    Marland Kitchen omeprazole (PRILOSEC) 20 MG capsule Take 20 mg  by mouth daily.    . polyethylene glycol (MIRALAX / GLYCOLAX) packet Take 17 g by mouth daily as needed for mild constipation. 14 each 0  . rOPINIRole (REQUIP) 0.5 MG tablet Take 1-1.5 mg by mouth See admin instructions. Take 2 tablets (1mg ) at 1800 and take 3 tablets (1.5mg ) at bedtime.    . tamsulosin (FLOMAX) 0.4 MG CAPS capsule Take 0.4 mg by mouth daily.      VITAL SIGNS: BP 111/64   Pulse 64   Resp (!) 9   Ht 5\' 8"  (1.727 m)   SpO2 99%   BMI 25.14 kg/m   VENTILATOR SETTINGS: Vent Mode: PRVC FiO2 (%):  [50 %] 50 % Set Rate:  [16 bmp-20 bmp] 20 bmp Vt Set:  [550 mL] 550 mL PEEP:  [5 cmH20] 5 cmH20 Plateau Pressure:  [19 cmH20] 19 cmH20  INTAKE / OUTPUT: No intake/output data recorded.  PHYSICAL EXAMINATION: GENERAL: Intubated.  Unresponsive/comatose.  HEAD: normocephalic, atraumatic EYE: Pupils are midline, sluggishly reactive.  No scleral icterus.  No pallor.  No doll's eyes. THROAT/ORAL CAVITY: ETT in situ. Mallampati ossification cannot be determined at this time.  OG tube in situ.  Copious green-brown secretions emanating from both the OG tube and ET tube circuits. NECK: supple, no thyromegaly, no JVD, no lymphadenopathy. Trachea midline. CHEST/LUNG: symmetric in development and expansion. Good air entry.  Rales and rhonchi bilaterally HEART: Regular  S1 and S2 without murmur, rub or gallop. ABDOMEN: soft, nontender, nondistended. Normoactive bowel sounds. No rebound. No guarding. No hepatosplenomegaly. EXTREMITIES: Edema: none. No cyanosis.  Digital clubbing noted. 2+ DP pulses LYMPHATIC: no cervical/axiallary/inguinal lymph nodes appreciated MUSCULOSKELETAL: No bulk atrophy  SKIN: No rash or lesion. NEUROLOGIC: No doll's eyes intact.  Equivocal corneal reflex.  Laboratory drive is diminished.  Strong cough/gag.  Ankle clonus bilaterally.    LABS:  BASIC METABOLIC PROFILE Recent Labs  Lab 07/06/2017 1339 06/25/2017 1344  NA 138 140  K 4.3 4.3  CL 103 102  CO2 19*  --   BUN 23* 25*  CREATININE 1.66* 1.40*  GLUCOSE 194* 188*  CALCIUM 8.7*  --     Glucose Recent Labs  Lab 07/09/2017 1339  GLUCAP 186*    Liver Enzymes No results for input(s): AST, ALT, ALKPHOS, BILITOT, ALBUMIN in the last 168 hours.  CBC Recent Labs  Lab 07/21/2017 1339 07/01/2017 1344  WBC 17.2*  --   HGB 9.3* 9.2*  HCT 30.2* 27.0*  PLT 188  --     COAGULATION STUDIES Recent Labs  Lab 07/03/2017 1339  INR 1.08    SEPSIS MARKERS Recent Labs  Lab 07/08/2017 1345 06/28/2017 1535  LATICACIDVEN 7.35* 4.81*    ABG Recent Labs  Lab 07/07/2017 1416 06/23/2017 1533  PHART 7.289* 7.302*  PCO2ART 54.0* 44.6  PO2ART 398.0* 72.0*    Cardiac Enzymes No results for input(s): TROPONINI, PROBNP in the last 168 hours.  Imaging Dg Chest Portable 1 View  Result Date: 06/25/2017 CLINICAL DATA:  Check OG placement EXAM: PORTABLE CHEST 1 VIEW COMPARISON:  Film from earlier in the same day. FINDINGS: Cardiac shadow is stable. Endotracheal tube and gastric catheter are noted in satisfactory position. Lungs are clear without focal infiltrate or effusion. Some persistent subcutaneous emphysema is noted in the region of the left shoulder. Right seventh rib fracture is noted somewhat better appreciated on the current exam. IMPRESSION: Gastric catheter in satisfactory  position. Right rib fracture somewhat better seen on the current exam. Stable subcutaneous emphysema about the left  shoulder. This may be related to the recent arrest. Electronically Signed   By: Alcide CleverMark  Lukens M.D.   On: 06/24/2017 14:55   Dg Chest Portable 1 View  Result Date: 07/01/2017 CLINICAL DATA:  Check endotracheal tube placement EXAM: PORTABLE CHEST 1 VIEW COMPARISON:  11/09/2016 FINDINGS: Cardiac shadow is within normal limits. Endotracheal tube is noted in satisfactory position 2.7 cm above the carina. Azygos lobe is noted. No focal infiltrate or sizable effusion is seen. No acute bony abnormality is seen. Some subcutaneous emphysema is noted in the region of the left clavicle and left shoulder. This is of uncertain significance. No pneumothorax is seen. IMPRESSION: Endotracheal tube as described. Mild subcutaneous emphysema in the region of the left shoulder without definitive pneumothorax or other abnormality. This is of uncertain significance. Continued follow-up is recommended. Electronically Signed   By: Alcide CleverMark  Lukens M.D.   On: 07/19/2017 14:40    SIGNIFICANT EVENTS: 4/20: Cardiac arrest at home with immediate initiation of CPR, required prolonged CPR.  Intubated in the field.  LINES/TUBES: 4/20: Endotracheal intubation in the field   ASSESSMENT / PLAN: Principal Problem:   Acute respiratory failure with hypercapnia (HCC) Active Problems:   Anoxic encephalopathy (HCC)   Aspiration pneumonia (HCC)   Normocytic anemia   Acute kidney injury (HCC)   Lactic acidosis   Parkinson's disease (HCC)   Rib fracture   By systems:  NEUROLOGIC  Anoxic encephalopathy  Parkinson's disease Prognosis is dismal. Continue supportive care. Patient's wife and son were at the bedside.  They have insisted on continuing FULL CODE STATUS.  PULMONARY  Acute hypercapnic respiratory failure  Aspiration pneumonia  Right seventh rib fracture, likely secondary to chest compressions: No  evidence of pneumothorax Titrate vent settings based on ABG results. Trach aspirate for Gram stain, culture/sensitivity.  Start Zosyn. Chest x-ray and ABG in a.m.  CARDIOVASCULAR  Shock, unknown etiology Wean epinephrine infusion as tolerated. Start norepinephrine, as needed. Start vasopressin infusion as needed.  RENAL  Acute kidney injury  Lactic acidosis Normal saline at 75 mL/h.  GASTROINTESTINAL  Emesis  HEMATOLOGIC  Normocytic anemia  Leukocytosis Monitor hemoglobin  ENDOCRINE  Hypothyroidism Check TSH. Continue Synthroid.   Admit to ICU under my service (Attending: Marcelle SmilingSeong-Joo Ata Pecha, MD) with the diagnoses highlighted above in the active Hospital Problem List (ASSESSMENT).  DVT PROPHYLAXIS: Heparin GI PROPHYLAXIS: Protonix  FAMILY  - Updates: Son and wife are at the bedside, updated.  - Inter-disciplinary family meet or Palliative Care meeting due by 07/17/2017   Marcelle SmilingSeong-Joo Annison Birchard, MD Board Certified by the ABIM, Pulmonary Diseases & Critical Care Medicine  [facility] Pager: [phone]  07/18/2017, 3:51 PM   Critical care time: 120 minutes. The treatment and management of the patient's condition was required based on the threat of imminent deterioration. This time reflects time spent by the physician evaluating, providing care and managing the critically ill patient's care. The time was spent at the immediate bedside (or on the same floor/unit and dedicated to this patient's care). Time involved in separately billable procedures is NOT included int he critical care time indicated above. Family meeting and update time may be included above if and only if the patient is unable/incompetent to participate in clinical interview and/or decision making, and the discussion was necessary to determining treatment decisions.  Marcelle SmilingSeong-Joo Doryan Bahl, MD

## 2017-07-11 NOTE — Progress Notes (Signed)
eLink Physician-Brief Progress Note Patient Name: Bryan LevinsManilal Vega DOB: March 01, 1941 MRN: [id number]   Date of Service  07/10/2017  HPI/Events of Note  Multiple issues: 1. Lactic Acid = 2.3 and 2. Troponin = 0.12 is setting of post cardiac arrest.   eICU Interventions  Will order: 1. Bolus with 0.9 NaCl 1 liter IV over 1 hour now.  2. ASA 81 mg per tube now and Q day. 3. Continue to trend Lactic Acid and Troponin.      Intervention Category Major Interventions: Acid-Base disturbance - evaluation and management Intermediate Interventions: Diagnostic test evaluation  Sommer,Steven Eugene 07/10/2017, 11:01 PM

## 2017-07-11 NOTE — Progress Notes (Signed)
PCCM Interval Note  Called to patient bedside to updated family. While in room patient noted to have myoclonic activity vs seizures. Neurology consulted.   Per Neurology given 2 mg Ativan. Depacon. And Restart Sinemet. EEG. Propofol ordered for suppression.   Bryan Vega, AGACNP-BC [facility]  Pgr: [phone]  PCCM Pgr: [phone]

## 2017-07-11 NOTE — ED Triage Notes (Signed)
Patient from home after witnessed arrest at approximately 1220. No complaints prior to arrest. Patient asystole on EMS arrival on scene. CPR initiated. Received 3x epi push en route. Sinus brady in 50's, external pacing started. Arrested again en route, ROSC after one epi. Patient arrives to ED externally paced with 75bpm . Changed to ED pacer, capture obtained at 80bpm . IO in left shoulder.

## 2017-07-11 NOTE — Procedures (Signed)
Arterial Catheter Insertion Procedure Note Alfred LevinsManilal Chaloux [id number] 1941/03/08  Procedure: Insertion of Arterial Catheter  Indications: Blood pressure monitoring  Procedure Details Consent: Unable to obtain consent because of emergent medical necessity. Time Out: Verified patient identification, verified procedure, site/side was marked, verified correct patient position, special equipment/implants available, medications/allergies/relevent history reviewed, required imaging and test results available.  Performed  Maximum sterile technique was used including antiseptics, cap, gloves, gown, hand hygiene, mask and sheet. Skin prep: Chlorhexidine; local anesthetic administered 20 gauge catheter was inserted into left radial artery using the Seldinger technique. ULTRASOUND GUIDANCE USED: YES Evaluation Blood flow good; BP tracing good. Complications: No apparent complications. RT placed arrow catheter on first attempt   Morley KosJohnson, Rayme Bui Leroy 07/20/2017

## 2017-07-11 NOTE — Progress Notes (Signed)
Pharmacy Antibiotic Note  Kwinton Allena Katzatel is a 77 y.o. male admitted on 07/18/2017 with pneumonia.  Pharmacy has been consulted for vancomycin dosing.  Plan: Vancomycin 1500mg  IV then 750mg  IV every 12 hours.  Goal trough 15-20 mcg/mL. Zosyn 3.375g IV q8h (4 hour infusion). Monitor clinical progression and LOT  Height: 5\' 9"  (175.3 cm) Weight: 169 lb 5 oz (76.8 kg) IBW/kg (Calculated) : 70.7  No data recorded.  Recent Labs  Lab 07/15/2017 1339 06/25/2017 1344 06/30/2017 1345 07/09/2017 1535  WBC 17.2*  --   --   --   CREATININE 1.66* 1.40*  --   --   LATICACIDVEN  --   --  7.35* 4.81*    Estimated Creatinine Clearance: 43.5 mL/min (A) (by C-G formula based on SCr of 1.4 mg/dL (H)).    No Known Allergies  Thank you for allowing pharmacy to be a part of this patient's care.  Toniann Failony L Jennife Zaucha 07/15/2017 4:57 PM

## 2017-07-11 NOTE — ED Notes (Signed)
Spoke with chaplain, Stated they would be down here once they finished w/ a family

## 2017-07-12 ENCOUNTER — Inpatient Hospital Stay (HOSPITAL_COMMUNITY): Payer: Medicare Other

## 2017-07-12 ENCOUNTER — Other Ambulatory Visit: Payer: Self-pay

## 2017-07-12 ENCOUNTER — Encounter (HOSPITAL_COMMUNITY): Payer: Self-pay

## 2017-07-12 DIAGNOSIS — G40901 Epilepsy, unspecified, not intractable, with status epilepticus: Secondary | ICD-10-CM

## 2017-07-12 DIAGNOSIS — R0600 Dyspnea, unspecified: Secondary | ICD-10-CM

## 2017-07-12 LAB — CBC
HCT: 27.8 % — ABNORMAL LOW (ref 39.0–52.0)
Hemoglobin: 9.2 g/dL — ABNORMAL LOW (ref 13.0–17.0)
MCH: 30.1 pg (ref 26.0–34.0)
MCHC: 33.1 g/dL (ref 30.0–36.0)
MCV: 90.8 fL (ref 78.0–100.0)
Platelets: 124 10*3/uL — ABNORMAL LOW (ref 150–400)
RBC: 3.06 MIL/uL — ABNORMAL LOW (ref 4.22–5.81)
RDW: 13.9 % (ref 11.5–15.5)
WBC: 4.8 10*3/uL (ref 4.0–10.5)

## 2017-07-12 LAB — RESPIRATORY PANEL BY PCR
Adenovirus: NOT DETECTED
BORDETELLA PERTUSSIS-RVPCR: NOT DETECTED
CORONAVIRUS 229E-RVPPCR: NOT DETECTED
Chlamydophila pneumoniae: NOT DETECTED
Coronavirus HKU1: NOT DETECTED
Coronavirus NL63: NOT DETECTED
Coronavirus OC43: NOT DETECTED
INFLUENZA B-RVPPCR: NOT DETECTED
Influenza A: NOT DETECTED
METAPNEUMOVIRUS-RVPPCR: NOT DETECTED
MYCOPLASMA PNEUMONIAE-RVPPCR: NOT DETECTED
PARAINFLUENZA VIRUS 2-RVPPCR: NOT DETECTED
Parainfluenza Virus 1: NOT DETECTED
Parainfluenza Virus 3: NOT DETECTED
Parainfluenza Virus 4: NOT DETECTED
RESPIRATORY SYNCYTIAL VIRUS-RVPPCR: NOT DETECTED
Rhinovirus / Enterovirus: NOT DETECTED

## 2017-07-12 LAB — GLUCOSE, CAPILLARY
GLUCOSE-CAPILLARY: 75 mg/dL (ref 65–99)
GLUCOSE-CAPILLARY: 92 mg/dL (ref 65–99)
Glucose-Capillary: 111 mg/dL — ABNORMAL HIGH (ref 65–99)
Glucose-Capillary: 69 mg/dL (ref 65–99)
Glucose-Capillary: 77 mg/dL (ref 65–99)
Glucose-Capillary: 84 mg/dL (ref 65–99)
Glucose-Capillary: 86 mg/dL (ref 65–99)

## 2017-07-12 LAB — TROPONIN I
TROPONIN I: 0.22 ng/mL — AB (ref <0.03)
Troponin I: 0.14 ng/mL (ref <0.03)
Troponin I: 0.1 ng/mL (ref <0.03)

## 2017-07-12 LAB — STREP PNEUMONIAE URINARY ANTIGEN: STREP PNEUMO URINARY ANTIGEN: NEGATIVE

## 2017-07-12 LAB — BASIC METABOLIC PANEL
Anion gap: 12 (ref 5–15)
BUN: 25 mg/dL — ABNORMAL HIGH (ref 6–20)
CALCIUM: 7.6 mg/dL — AB (ref 8.9–10.3)
CO2: 18 mmol/L — ABNORMAL LOW (ref 22–32)
CREATININE: 1.36 mg/dL — AB (ref 0.61–1.24)
Chloride: 110 mmol/L (ref 101–111)
GFR calc Af Amer: 56 mL/min — ABNORMAL LOW (ref >60)
GFR calc non Af Amer: 48 mL/min — ABNORMAL LOW (ref >60)
GLUCOSE: 89 mg/dL (ref 65–99)
Potassium: 3.5 mmol/L (ref 3.5–5.1)
Sodium: 140 mmol/L (ref 135–145)

## 2017-07-12 LAB — BLOOD GAS, ARTERIAL
ACID-BASE DEFICIT: 1.4 mmol/L (ref 0.0–2.0)
BICARBONATE: 22.1 mmol/L (ref 20.0–28.0)
Drawn by: 398991
FIO2: 40
LHR: 25 {breaths}/min
O2 Saturation: 98.6 %
PEEP/CPAP: 5 cmH2O
PH ART: 7.446 (ref 7.350–7.450)
Patient temperature: 98.6
VT: 550 mL
pCO2 arterial: 32.6 mmHg (ref 32.0–48.0)
pO2, Arterial: 124 mmHg — ABNORMAL HIGH (ref 83.0–108.0)

## 2017-07-12 LAB — MAGNESIUM: Magnesium: 2 mg/dL (ref 1.7–2.4)

## 2017-07-12 LAB — PHOSPHORUS: Phosphorus: 1.2 mg/dL — ABNORMAL LOW (ref 2.5–4.6)

## 2017-07-12 LAB — ECHOCARDIOGRAM COMPLETE
Height: 69 in
WEIGHTICAEL: 2709.01 [oz_av]

## 2017-07-12 LAB — LACTIC ACID, PLASMA
LACTIC ACID, VENOUS: 5.1 mmol/L — AB (ref 0.5–1.9)
Lactic Acid, Venous: 4 mmol/L (ref 0.5–1.9)

## 2017-07-12 MED ORDER — ONDANSETRON 4 MG PO TBDP
4.0000 mg | ORAL_TABLET | Freq: Four times a day (QID) | ORAL | Status: DC | PRN
  Filled 2017-07-12: qty 1

## 2017-07-12 MED ORDER — ORAL CARE MOUTH RINSE
15.0000 mL | OROMUCOSAL | Status: DC
  Administered 2017-07-12 (×8): 15 mL via OROMUCOSAL

## 2017-07-12 MED ORDER — POTASSIUM PHOSPHATES 15 MMOLE/5ML IV SOLN
30.0000 mmol | Freq: Once | INTRAVENOUS | Status: AC
  Administered 2017-07-12: 30 mmol via INTRAVENOUS
  Filled 2017-07-12: qty 10

## 2017-07-12 MED ORDER — CARBIDOPA-LEVODOPA 25-100 MG PO TABS
2.5000 | ORAL_TABLET | Freq: Four times a day (QID) | ORAL | Status: DC
  Administered 2017-07-12: 2.5
  Filled 2017-07-12 (×3): qty 2.5

## 2017-07-12 MED ORDER — LEVETIRACETAM IN NACL 1000 MG/100ML IV SOLN
1000.0000 mg | INTRAVENOUS | Status: AC
  Administered 2017-07-12: 1000 mg via INTRAVENOUS
  Filled 2017-07-12: qty 100

## 2017-07-12 MED ORDER — BIOTENE DRY MOUTH MT LIQD: 15.0000 mL | OROMUCOSAL | Status: DC | PRN

## 2017-07-12 MED ORDER — DEXTROSE 5 % IV SOLN
INTRAVENOUS | Status: DC
  Administered 2017-07-12: 13:00:00 via INTRAVENOUS

## 2017-07-12 MED ORDER — SODIUM CHLORIDE 0.9 % IV BOLUS: 1000.0000 mL | Freq: Once | INTRAVENOUS | Status: DC

## 2017-07-12 MED ORDER — HYDRALAZINE HCL 20 MG/ML IJ SOLN
5.0000 mg | Freq: Four times a day (QID) | INTRAMUSCULAR | Status: DC | PRN
  Administered 2017-07-12: 5 mg via INTRAVENOUS

## 2017-07-12 MED ORDER — CHLORHEXIDINE GLUCONATE 0.12% ORAL RINSE (MEDLINE KIT)
15.0000 mL | Freq: Two times a day (BID) | OROMUCOSAL | Status: DC
  Administered 2017-07-12 (×2): 15 mL via OROMUCOSAL

## 2017-07-12 MED ORDER — SODIUM CHLORIDE 0.9 % IV SOLN
1500.0000 mg | Freq: Once | INTRAVENOUS | Status: AC
  Administered 2017-07-12: 1500 mg via INTRAVENOUS
  Filled 2017-07-12: qty 30

## 2017-07-12 MED ORDER — SODIUM CHLORIDE 0.9 % IV BOLUS
1000.0000 mL | Freq: Once | INTRAVENOUS | Status: AC
  Administered 2017-07-12: 1000 mL via INTRAVENOUS

## 2017-07-12 MED ORDER — HYDRALAZINE HCL 20 MG/ML IJ SOLN
INTRAMUSCULAR | Status: AC
  Filled 2017-07-12: qty 1

## 2017-07-12 MED ORDER — MORPHINE 100MG IN NS 100ML (1MG/ML) PREMIX INFUSION
1.0000 mg/h | INTRAVENOUS | Status: DC
  Administered 2017-07-12: 2 mg/h via INTRAVENOUS
  Filled 2017-07-12: qty 100

## 2017-07-12 MED ORDER — POLYVINYL ALCOHOL 1.4 % OP SOLN
1.0000 [drp] | Freq: Four times a day (QID) | OPHTHALMIC | Status: DC | PRN
  Filled 2017-07-12: qty 15

## 2017-07-12 MED ORDER — LORAZEPAM 2 MG/ML IJ SOLN: 1.0000 mg | INTRAMUSCULAR | Status: DC | PRN

## 2017-07-12 MED ORDER — ONDANSETRON HCL 4 MG/2ML IJ SOLN: 4.0000 mg | Freq: Four times a day (QID) | INTRAMUSCULAR | Status: DC | PRN

## 2017-07-12 MED ORDER — MORPHINE BOLUS VIA INFUSION
2.0000 mg | INTRAVENOUS | Status: DC | PRN
  Administered 2017-07-12 (×2): 4 mg via INTRAVENOUS
  Administered 2017-07-12: 2 mg via INTRAVENOUS
  Administered 2017-07-12 (×3): 4 mg via INTRAVENOUS
  Administered 2017-07-12: 2 mg via INTRAVENOUS
  Administered 2017-07-12 – 2017-07-13 (×2): 4 mg via INTRAVENOUS
  Filled 2017-07-12: qty 4

## 2017-07-12 NOTE — Progress Notes (Signed)
Notified Dr. Darreld McleanVishal Fonder of family wanting to extubate and make patient comfortable. MD at bedside.

## 2017-07-12 NOTE — Progress Notes (Signed)
On my review of the EEG, there are generalized periodic discharges with a frequency 1 - 1.5 Hz and delta wave morphology. Though this is a pattern on the ictal-interictal continuum, I do not see any evidence of evolution to support an ictal nature.  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    Ritta SlotMcNeill Ora Bollig, MD [facility] [phone]  If 7pm- 7am, please page neurology on call as listed in AMION.

## 2017-07-12 NOTE — Progress Notes (Signed)
eLink Physician-Brief Progress Note Patient Name: Bryan Vega DOB: [birth year] MRN: [id number]   Date of Service  07/12/2017  HPI/Events of Note  Lactic Acid = 2.3  --> 4.0 --> 5.1. BP = 134/57, Hgb = 9.2 and Last LVEF = 60% to 65% (06/2016).  eICU Interventions  Will order: 1. Bolus with 0.9 NaCl 1 liter IV over 1 hour now.      Intervention Category Major Interventions: Acid-Base disturbance - evaluation and management  Sommer,Steven Eugene 07/12/2017, 3:58 AM

## 2017-07-12 NOTE — Progress Notes (Signed)
SAME DAY PROGRESS  EEG shows NCSE CT with early signs of hypoxic ischemic enceph. This evening, sinus tach, instability of vitals. Poor prognosis due to myolconic jerks on arrival and now NCSE with CT changes of hypoxic anoxic enceph. PCCM to discuss further with family.  REC: Poor prognosis d/w PCCM Dilantin load and Keppra additional dose for NCSE LTM EEG MRI when leads off. Can hold off for now.   Further recs on EEG review and clinical course  -- Milon DikesAshish Mariadelcarmen Corella, MD [facility] Pager: [phone] If 7pm to 7am, please call on call as listed on AMION.

## 2017-07-12 NOTE — Progress Notes (Signed)
Second L bolus started

## 2017-07-12 NOTE — Progress Notes (Signed)
EEG arrived at bedside.

## 2017-07-12 NOTE — Procedures (Signed)
Date of recording 07/12/2017  Referring physician Dr Wilford CornerArora  Reason for the study Cardiac arrest, unresponsive, on ventilatory support, propofol was turned off during the exam  Technical Digital EEG recording using 10-20 international electrode system.  Description of the recording Almost continuous generalized rhythmic delta suggesting electrographic seizure or status epilepticus. On the camera patient eyes are deviated up. No EEG reactivity with painful stimuli  Impression Nonconvulsive status epilepticus.

## 2017-07-12 NOTE — Progress Notes (Signed)
eLink Physician-Brief Progress Note Patient Name: Bryan Vega DOB: 11-Jan-1941 MRN: [id number]   Date of Service  07/12/2017  HPI/Events of Note  Lactic Acid = 2.3 --> 4.0. Last LVEF = 60% to 65%  eICU Interventions  Will bolus with 0.9 NaCl 1 liter IV over 1 hour now.      Intervention Category Major Interventions: Acid-Base disturbance - evaluation and management  Sommer,Steven Eugene 07/12/2017, 12:58 AM

## 2017-07-12 NOTE — Progress Notes (Signed)
Patient started back on propofol and stopped again due to low blood pressure. MD notified. Order given to bolus 1 L fluid.

## 2017-07-12 NOTE — Progress Notes (Signed)
EEG complete - results pending 

## 2017-07-12 NOTE — Progress Notes (Signed)
Patient extubated at this time per order, placed on 2 Lpm nasal cannula for comfort. RN and family at bedside. Patient comfortable at this time.

## 2017-07-12 NOTE — Progress Notes (Signed)
[facility] notified of LA=4.0. No new orders at this time.

## 2017-07-12 NOTE — Progress Notes (Signed)
Patient has new tachycardia in the 130s and sustaining. MD notified and requested to come to bedside. Order given to restart propofol.

## 2017-07-12 NOTE — Progress Notes (Signed)
Progress Note  Patient Name: Bryan Vega Date of Encounter: 07/12/2017  Primary Cardiologist: new  Subjective   No events overnight.   Inpatient Medications    Scheduled Meds: . aspirin  81 mg Oral Daily  . carbidopa-levodopa  1 tablet Per Tube TID  . chlorhexidine gluconate (MEDLINE KIT)  15 mL Mouth Rinse BID  . heparin  5,000 Units Subcutaneous Q8H  . insulin aspart  1-3 Units Subcutaneous Q4H  . ipratropium-albuterol  3 mL Nebulization Q4H  . mouth rinse  15 mL Mouth Rinse 10 times per day  . pantoprazole sodium  40 mg Per Tube Daily   Continuous Infusions: . sodium chloride    . sodium chloride 250 mL (07/09/2017 1800)  . levETIRAcetam Stopped (07/12/17 0804)  . piperacillin-tazobactam    . piperacillin-tazobactam (ZOSYN)  IV Stopped (07/12/17 0913)  . potassium PHOSPHATE IVPB (mmol) 30 mmol (07/12/17 0540)  . propofol (DIPRIVAN) infusion Stopped (07/12/17 1120)  . valproate sodium Stopped (07/12/17 9379)  . vancomycin Stopped (07/12/17 0240)   PRN Meds: Place/Maintain arterial line **AND** sodium chloride, sodium chloride, acetaminophen (TYLENOL) oral liquid 160 mg/5 mL, acetaminophen, albuterol, fentaNYL (SUBLIMAZE) injection, fentaNYL (SUBLIMAZE) injection, midazolam, midazolam   Vital Signs    Vitals:   07/12/17 0500 07/12/17 0630 07/12/17 0753 07/12/17 0804  BP: 122/65 106/68  (!) 149/47  Pulse: 80 79  76  Resp: 20 20    Temp:  97.8 F (36.6 C) 98.3 F (36.8 C)   TempSrc:  Rectal Oral   SpO2: 100% 100%  100%  Weight:      Height:        Intake/Output Summary (Last 24 hours) at 07/12/2017 1130 Last data filed at 07/12/2017 1000 Gross per 24 hour  Intake 3322.23 ml  Output 1370 ml  Net 1952.23 ml   Filed Weights   06/23/2017 1649 07/12/17 0350  Weight: 169 lb 5 oz (76.8 kg) 169 lb 5 oz (76.8 kg)    Telemetry    SR - Personally Reviewed  ECG    na  Physical Exam   GEN: Sedated Neck: No JVD Cardiac: RRR, no murmurs, rubs, or gallops.    Respiratory: Clear to auscultation bilaterally. GI: Soft, nontender, non-distended  MS: No edema; No deformity. Neuro:  intubated, sedated Psych: intubated, sedated  Labs    Chemistry Recent Labs  Lab 06/23/2017 1339 07/21/2017 1344 07/08/2017 2115 07/12/17 0251  NA 138 140  --  140  K 4.3 4.3  --  3.5  CL 103 102  --  110  CO2 19*  --   --  18*  GLUCOSE 194* 188*  --  89  BUN 23* 25*  --  25*  CREATININE 1.66* 1.40*  --  1.36*  CALCIUM 8.7*  --   --  7.6*  PROT  --   --  5.7*  --   ALBUMIN  --   --  2.9*  --   AST  --   --  64*  --   ALT  --   --  9*  --   ALKPHOS  --   --  54  --   BILITOT  --   --  0.6  --   GFRNONAA 38*  --   --  48*  GFRAA 44*  --   --  56*  ANIONGAP 16*  --   --  12     Hematology Recent Labs  Lab 07/20/2017 1339 07/10/2017 1344 07/12/17 0251  WBC 17.2*  --  4.8  RBC 3.22*  --  3.06*  HGB 9.3* 9.2* 9.2*  HCT 30.2* 27.0* 27.8*  MCV 93.8  --  90.8  MCH 28.9  --  30.1  MCHC 30.8  --  33.1  RDW 14.0  --  13.9  PLT 188  --  124*    Cardiac Enzymes Recent Labs  Lab 07/07/2017 2115 07/12/17 0251 07/12/17 0922  TROPONINI 0.12* 0.22* 0.14*    Recent Labs  Lab 07/18/2017 1343  TROPIPOC 0.00     BNP Recent Labs  Lab 07/20/2017 1732  BNP 76.6     DDimer No results for input(s): DDIMER in the last 168 hours.   Radiology    Ct Head Wo Contrast  Result Date: 07/04/2017 CLINICAL DATA:  Cardiac arrest, anoxic brain injury. History of hypertension, hyperlipidemia. EXAM: CT HEAD WITHOUT CONTRAST TECHNIQUE: Contiguous axial images were obtained from the base of the skull through the vertex without intravenous contrast. COMPARISON:  CT HEAD Aug 08, 2016 FINDINGS: BRAIN: No intraparenchymal hemorrhage, mass effect nor midline shift. The ventricles and sulci are normal for age. Mildly indistinct gray-white matter differentiation. No acute large vascular territory infarcts. No abnormal extra-axial fluid collections. Basal cisterns are patent. VASCULAR:  Mild calcific atherosclerosis of the carotid siphons. SKULL: No skull fracture. No significant scalp soft tissue swelling. SINUSES/ORBITS: Mild paranasal sinus mucosal thickening with air-fluid levels. Bilateral middle ear and mastoid effusions. Included ocular globes and orbital contents are non-suspicious. Status post RIGHT ocular lens implant. OTHER: Life-support lines in place. IMPRESSION: 1. 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 Electronically Signed   By: Elon Alas M.D.   On: 06/29/2017 20:21   Ct Angio Chest Pe W Or Wo Contrast  Result Date: 07/10/2017 CLINICAL DATA:  77 y/o M; post cardiac arrest with respiratory distress. EXAM: CT ANGIOGRAPHY CHEST WITH CONTRAST TECHNIQUE: Multidetector CT imaging of the chest was performed using the standard protocol during bolus administration of intravenous contrast. Multiplanar CT image reconstructions and MIPs were obtained to evaluate the vascular anatomy. CONTRAST:  65 cc Isovue 370 COMPARISON:  07/19/2017 chest radiograph FINDINGS: Cardiovascular: Normal heart size. No pericardial effusion. Moderate calcific atherosclerosis of the coronary arteries. Normal caliber thoracic aorta and main pulmonary artery. Mild calcific atherosclerosis of the aorta. Satisfactory opacification of the pulmonary arteries. No pulmonary embolus identified. Mediastinum/Nodes: Endotracheal tube 3.5 cm above the carina. Enteric tube tip in the distal stomach. Normal thyroid gland. Calcified mediastinal lymph nodes compatible with sequelae of prior granulomatous disease. No pneumomediastinum or mediastinal hematoma. Lungs/Pleura: Multiple areas of consolidation and ground-glass nodules in bronchovascular distribution greatest within the dependent upper and lower lobes bilaterally. No pleural effusion or pneumothorax. Upper Abdomen: Cysts in bilateral kidneys measuring up to 6.1 cm cyst in the upper pole of the left  kidney. Musculoskeletal: Acute minimally displaced right lateral seventh rib fracture. No other fracture identified. Multilevel degenerative changes of the spine. Subcutaneous emphysema present throughout the left upper chest wall and upper extremity. Review of the MIP images confirms the above findings. IMPRESSION: 1. No pulmonary embolus identified. 2. Multiple areas of consolidation greatest within the dependent upper and lower lobes which may represent multifocal pneumonia or possibly aspiration given cardiac arrest. 3. Acute minimally displaced fracture of the right lateral seventh rib. No pneumothorax. 4. Coronary and aortic calcific atherosclerosis. Electronically Signed   By: Kristine Garbe M.D.   On: 07/03/2017 20:31   Dg Chest Port 1 View  Result Date: 07/12/2017 CLINICAL DATA:  Aspiration pneumonia,  acute respiratory failure, anoxic encephalopathy. EXAM: PORTABLE CHEST 1 VIEW COMPARISON:  Portable chest and chest CTA dated 07/04/2017 FINDINGS: Endotracheal tube in satisfactory position. Nasogastric tube extending into the stomach. Normal sized heart. Mild bibasilar airspace opacity as well as increased opacity in the left lower lobe. Displaced right 7th rib fracture. No pneumothorax. IMPRESSION: 1. Interval mild bibasilar atelectasis and more pronounced left lower lobe atelectasis, pneumonia or aspiration pneumonitis. 2. Stable displaced right 7th rib fracture. Electronically Signed   By: Claudie Revering M.D.   On: 07/12/2017 09:02   Dg Chest Portable 1 View  Result Date: 07/17/2017 CLINICAL DATA:  Check OG placement EXAM: PORTABLE CHEST 1 VIEW COMPARISON:  Film from earlier in the same day. FINDINGS: Cardiac shadow is stable. Endotracheal tube and gastric catheter are noted in satisfactory position. Lungs are clear without focal infiltrate or effusion. Some persistent subcutaneous emphysema is noted in the region of the left shoulder. Right seventh rib fracture is noted somewhat better  appreciated on the current exam. IMPRESSION: Gastric catheter in satisfactory position. Right rib fracture somewhat better seen on the current exam. Stable subcutaneous emphysema about the left shoulder. This may be related to the recent arrest. Electronically Signed   By: Inez Catalina M.D.   On: 06/24/2017 14:55   Dg Chest Portable 1 View  Result Date: 07/10/2017 CLINICAL DATA:  Check endotracheal tube placement EXAM: PORTABLE CHEST 1 VIEW COMPARISON:  11/09/2016 FINDINGS: Cardiac shadow is within normal limits. Endotracheal tube is noted in satisfactory position 2.7 cm above the carina. Azygos lobe is noted. No focal infiltrate or sizable effusion is seen. No acute bony abnormality is seen. Some subcutaneous emphysema is noted in the region of the left clavicle and left shoulder. This is of uncertain significance. No pneumothorax is seen. IMPRESSION: Endotracheal tube as described. Mild subcutaneous emphysema in the region of the left shoulder without definitive pneumothorax or other abnormality. This is of uncertain significance. Continued follow-up is recommended. Electronically Signed   By: Inez Catalina M.D.   On: 07/06/2017 14:40    Cardiac Studies     Patient Profile     77 y.o. male Bayard More is a 77 y.o. male with a hx of Parkinsons diease who is being seen today for the evaluation of cardiac arrest at the request of Dr Carson Myrtle.     Assessment & Plan    1. Cardiac arrest - at this time unclear etiology - rhythm from later on in the arrest reported as asystole. No captured VT or VF. Bradycardia after resuscitation requiring transient transcutaneous pacing, now off.  - his EKG after resuscitation shows NSR, no ischemic changes. Trop only up to 0.22 and trending down, would argue against a primary ischemic event. - echo pending - CT PE negative  - at this time available evidence does not strongly support a primary cardiac cause - f/u echo. Consider ischemic testing pending his  neurological status after prolonged arrest - family member is CHF PA. She reports he had a normal cath in 2016  2. Seizure like activity - seen by neuro, poor overall neuro prognosis based on these findings per neuro - plan for EEG - CT head concern for hypoxic injury  3. Shock - post arrest. Initially was on epi drip now off, maintaing pressures  4. Possible pneumonia - abx per primary team   For questions or updates, please contact L[facility] Please consult [email or web address] for contact info under Cardiology/STEMI.      Signed, Carlyle Dolly, MD  07/12/2017, 11:30 AM

## 2017-07-12 NOTE — Progress Notes (Signed)
PCCM Interval Note  Evaluated patient who has had clinical change through the early afternoon.  He is evolving sinus tachycardia, heart rate 130s.  Also now becoming hypotensive.  He has a small cuff leak.  When air is replaced into his cuff balloon he temporarily improved but then the leak restarts.  His EEG has been read and shows almost continuous generalized rhythmic delta waves consistent with suspected status epilepticus.  I tried restarting his propofol but his blood pressure is in the 60s and he likely would not tolerate so it was held.  I do not believe he is stable enough to undergo a tube exchange right now.  We will check an ABG to ensure that he is adequately ventilating.  Replaces air in his cuff balloon intermittently.  Given his new tachycardia, hypotension and overall instability I am concerned that he is hemodynamically changing due to evolution of his brain injury.  We will continue his Keppra, valproate.  We will review with neurology, consider loading him with Dilantin or possibly starting benzodiazepines.  If we were to do this it would probably necessitate initiation of pressors.  With the overall picture here one of severe brain injury and overall decline I would like to avoid aggressive invasive interventions if at all possible.  I am not sure that starting pressors is going to be truly to his benefit.  I have mentioned to his daughter and wife at bedside that I do not believe there is any benefit to undergoing repeat CPR should he decline in front of us.  They understand and agree.  We will change his CODE STATUS to DNR.  Continue to try to stabilize him if possible to allow conversation with neurology.  Independent CC time 30 minutes  Levy Pupaobert Seferina Brokaw, MD, PhD 07/12/2017, 4:25 PM [facility] [phone] or if no answer [phone]

## 2017-07-12 NOTE — Progress Notes (Signed)
EEG reviewed again with Dr. Amada JupiterKirkpatrick.  Consistent with GPEDs and not so much with nonconvulsive status epilepticus.

## 2017-07-12 NOTE — Progress Notes (Signed)
RT at bedside to extubate patient. Family insisted on staying in the room. Will continue to provide emotional support to family at this time.

## 2017-07-12 NOTE — Progress Notes (Signed)
Patient is hypotensive. MD notified. Order received to infuse 1 L bolus.

## 2017-07-12 NOTE — Progress Notes (Signed)
Notified by RN that family has made the decision to pursue comfort care at this time with one way extubation. I discussed with the family and they are all in agreement to transition to full comfort care tonight given the poor prognosis with severe anoxic brain injury after cardiac arrest and prolonged CPR. CODE STATUS was changed to DO NOT RESUSCITATE earlier today. Will start morphine drip and extubate when family is ready.  Darreld McleanVishal Punches, MD Internal Medicine PGY-3

## 2017-07-12 NOTE — Progress Notes (Signed)
Neurology Progress Note   S:// No acute changes   O:// Current vital signs: BP (!) 149/47   Pulse 76   Temp 98.3 F (36.8 C) (Oral)   Resp 20   Ht _0  (1.753 m)   Wt 76.8 kg (169 lb 5 oz)   SpO2 100%   BMI 25.00 kg/m  Vital signs in last 24 hours: Temp:  [97.6 F (36.4 C)-102.7 F (39.3 C)] 98.3 F (36.8 C) (04/21 0753) Pulse Rate:  [60-102] 76 (04/21 0804) Resp:  [9-38] 20 (04/21 0630) BP: (55-166)/(35-77) 149/47 (04/21 0804) SpO2:  [96 %-100 %] 100 % (04/21 0804) Arterial Line BP: (105-233)/(41-76) 178/57 (04/21 0630) FiO2 (%):  [40 %-60 %] 40 % (04/21 0804) Weight:  [76.8 kg (169 lb 5 oz)] 76.8 kg (169 lb 5 oz) (04/21 0350) GEN: sedated on prop, intubated HEENT: [place] Ward AT [facility]: S1S2+ rrr Neurological Sedated intubated Sedation held for exam No spontaneous movements Some myoclonic eyelid movement seen Breathing over the vent occasionally No corneals No gag No response to nox stim  Medications  Current Facility-Administered Medications:  .  Place/Maintain arterial line, , , Until Discontinued **AND** 0.9 %  sodium chloride infusion, , Intra-arterial, PRN, Clifton James, MD .  0.9 %  sodium chloride infusion, 250 mL, Intravenous, PRN, Renee Pain, MD, Last Rate: 20 mL/hr at 07/05/2017 1800, 250 mL at 07/15/2017 1800 .  acetaminophen (TYLENOL) solution 650 mg, 650 mg, Per Tube, Q6H PRN, Omar Person, NP, 650 mg at 07/07/2017 2245 .  acetaminophen (TYLENOL) suppository 650 mg, 650 mg, Rectal, Q6H PRN, Omar Person, NP .  albuterol (PROVENTIL) (2.5 MG/3ML) 0.083% nebulizer solution 2.5 mg, 2.5 mg, Nebulization, Q4H PRN, Renee Pain, MD .  aspirin chewable tablet 81 mg, 81 mg, Oral, Daily, Anders Simmonds, MD, 81 mg at 07/06/2017 2312 .  carbidopa-levodopa (SINEMET IR) 25-100 MG per tablet immediate release 1 tablet, 1 tablet, Per Tube, TID, Omar Person, NP, 1 tablet at 06/22/2017 2245 .  chlorhexidine gluconate (MEDLINE KIT) (PERIDEX) 0.12  % solution 15 mL, 15 mL, Mouth Rinse, BID, Renee Pain, MD, 15 mL at 07/12/17 0800 .  fentaNYL (SUBLIMAZE) injection 50 mcg, 50 mcg, Intravenous, Q15 min PRN, Dewaine Oats, Katalina M, NP .  fentaNYL (SUBLIMAZE) injection 50 mcg, 50 mcg, Intravenous, Q2H PRN, Omar Person, NP .  heparin injection 5,000 Units, 5,000 Units, Subcutaneous, Q8H, Renee Pain, MD, 5,000 Units at 07/12/17 0515 .  insulin aspart (novoLOG) injection 1-3 Units, 1-3 Units, Subcutaneous, Q4H, Hammonds, Sharyn Blitz, MD .  ipratropium-albuterol (DUONEB) 0.5-2.5 (3) MG/3ML nebulizer solution 3 mL, 3 mL, Nebulization, Q4H, Hammonds, Sharyn Blitz, MD, 3 mL at 07/12/17 0803 .  levETIRAcetam (KEPPRA) 750 mg in sodium chloride 0.9 % 100 mL IVPB, 750 mg, Intravenous, Q12H, Greta Doom, MD, Stopped at 07/12/17 0804 .  MEDLINE mouth rinse, 15 mL, Mouth Rinse, 10 times per day, Renee Pain, MD, 15 mL at 07/12/17 0542 .  midazolam (VERSED) injection 1 mg, 1 mg, Intravenous, Q15 min PRN, Hayden Rasmussen, MD .  midazolam (VERSED) injection 1 mg, 1 mg, Intravenous, Q2H PRN, Hayden Rasmussen, MD .  pantoprazole sodium (PROTONIX) 40 mg/20 mL oral suspension 40 mg, 40 mg, Per Tube, Daily, Renee Pain, MD, Stopped at 06/28/2017 1732 .  piperacillin-tazobactam (ZOSYN) IVPB 3.375 g, 3.375 g, Intravenous, Once, Renee Pain, MD .  piperacillin-tazobactam (ZOSYN) IVPB 3.375 g, 3.375 g, Intravenous, Q8H, Renee Pain, MD, Last Rate: 12.5 mL/hr at 07/12/17 0513, 3.375 g  at 07/12/17 0513 .  potassium PHOSPHATE 30 mmol in dextrose 5 % 500 mL infusion, 30 mmol, Intravenous, Once, Omar Person, NP, Last Rate: 85 mL/hr at 07/12/17 0540, 30 mmol at 07/12/17 0540 .  propofol (DIPRIVAN) 1000 MG/100ML infusion, 0-50 mcg/kg/min, Intravenous, Continuous, Eubanks, Danie Chandler, NP, Last Rate: 9.2 mL/hr at 07/12/17 0634, 20 mcg/kg/min at 07/12/17 0634 .  valproate (DEPACON) 500 mg in dextrose 5 % 50 mL IVPB, 500 mg,  Intravenous, Q8H, Greta Doom, MD, Stopped at 07/12/17 [id number] .  vancomycin (VANCOCIN) IVPB 750 mg/150 ml premix, 750 mg, Intravenous, Q12H, Dawayne Cirri, RPH, Stopped at 07/12/17 0628 Labs CBC    Component Value Date/Time   WBC 4.8 07/12/2017 0251   RBC 3.06 (L) 07/12/2017 0251   HGB 9.2 (L) 07/12/2017 0251   HCT 27.8 (L) 07/12/2017 0251   PLT 124 (L) 07/12/2017 0251   MCV 90.8 07/12/2017 0251   MCH 30.1 07/12/2017 0251   MCHC 33.1 07/12/2017 0251   RDW 13.9 07/12/2017 0251   LYMPHSABS 6.2 (H) 07/09/2017 1339   MONOABS 0.5 07/20/2017 1339   EOSABS 0.3 07/15/2017 1339   BASOSABS 0.0 07/07/2017 1339    CMP     Component Value Date/Time   NA 140 07/12/2017 0251   K 3.5 07/12/2017 0251   CL 110 07/12/2017 0251   CO2 18 (L) 07/12/2017 0251   GLUCOSE 89 07/12/2017 0251   BUN 25 (H) 07/12/2017 0251   CREATININE 1.36 (H) 07/12/2017 0251   CALCIUM 7.6 (L) 07/12/2017 0251   PROT 5.7 (L) 06/30/2017 2115   ALBUMIN 2.9 (L) 06/30/2017 2115   AST 64 (H) 07/18/2017 2115   ALT 9 (L) 07/10/2017 2115   ALKPHOS 54 07/19/2017 2115   BILITOT 0.6 07/17/2017 2115   GFRNONAA 48 (L) 07/12/2017 0251   GFRAA 56 (L) 07/12/2017 0251   Imaging I have reviewed images in epic and the results pertinent to this consultation are: CT-scan of the brain - early changes consistent with hypoxic injury  Assessment:   77 year old man with a history of Parkinson's, status post cardiac arrest with the prolonged downtime nearly 1 hour, with frequent generalized myoclonus. Myoclonus so early after cardiac arrest with a CT scan suggestive of hypoxic ischemic injury usually portends poor prognosis for neurologically meaningful recovery.   Impression: Hypoxic/anoxic brain injury  Recommendations: Pending EEG -to evaluate for underlying seizure or myoclonic status epilepticus although clinically less likely. Continue supportive management per critical care Continue with Keppra and Depakote for  now. We will consider MRI depending on clinical course and EEG findings.  Will more likely be for informative purposes and not change course much. Had a detailed discussion with the wife and the daughter at bedside.  Explained the rationale for diagnostic studies.  Also explained potential for bad outcome and slim chances for any meaningful recovery. Discussed with the ICU attending on the floor.   -- Amie Portland, MD [facility] Pager: [phone] If 7pm to 7am, please call on call as listed on AMION.  CRITICAL CARE ATTESTATION This patient is critically ill and at significant risk of neurological worsening, death and care requires constant monitoring of vital signs, hemodynamics,respiratory and cardiac monitoring. I spent 35  minutes of neurocritical care time performing neurological assessment, discussion with family, other specialists and medical decision making of high complexity in the care of  this patient.

## 2017-07-12 NOTE — Progress Notes (Signed)
ELINK notified of LA 5.1. Will give another Liter of NS. Continuing to monitor.

## 2017-07-12 NOTE — Progress Notes (Signed)
  Echocardiogram 2D Echocardiogram has been performed.  Janalyn HarderWest, Lenny Bouchillon R 07/12/2017, 3:10 PM

## 2017-07-12 NOTE — Progress Notes (Signed)
HISTORY & PHYSICAL  Patient Name: Bryan Vega MRN: [id number] DOB: 06-23-1940    ADMISSION DATE:  2017/12/10 DATE OF SERVICE:  2017/12/10  REFERRING MD:  Meridee ScoreMichael Butler, MD  CHIEF COMPLAINT: Cardiac arrest   HISTORY OF PRESENT ILLNESS  This 77 y.o. Asian [place] male reformed smoker [facility] via EMS with cardiac arrest.  The patient experienced a witnessed cardiac arrest at home.  The patient's wife saw the patient slumped over in the couch.  The patient's son Building control surveyor(local law enforcement officer) was at home and immediately came to his side and initiated CPR.  911 was called.  The patient's son reports he was on successful in retrieving achieving [place].  EMS intubated the patient and continued CPR.  In the emergency department, the patient demonstrated sinus tachycardia which degraded to bradycardia arrhythmia.  He was started on epinephrine infusion.  At the time of clinical interview, the patient is intubated and on mechanical ventilatory support (synchronous with the ventilator despite acid pH).  He is on fentanyl infusion.  Although the patient's wife and son were at home at the time of his arrest, no inciting event was witnessed.  EMS did report obvious signs of emesis.  Interval Events:  Significant myoclonus noted last 24h, propofol started to suppress - improved Propofol now off   VITAL SIGNS: BP (!) 149/47   Pulse 76   Temp 98.3 F (36.8 C) (Oral)   Resp 20   Ht 5\' 9"  (1.753 m)   Wt 76.8 kg (169 lb 5 oz)   SpO2 100%   BMI 25.00 kg/m   VENTILATOR SETTINGS: Vent Mode: PRVC FiO2 (%):  [40 %-60 %] 40 % Set Rate:  [16 bmp-20 bmp] 20 bmp Vt Set:  [550 mL] 550 mL PEEP:  [5 cmH20] 5 cmH20 Plateau Pressure:  [18 cmH20-28 cmH20] 18 cmH20  INTAKE / OUTPUT: I/O last 3 completed shifts: In: 3303.8 [I.V.:317.3; IV Piggyback:2986.6] Out: 1120 [Urine:945; Emesis/NG output:175]  PHYSICAL EXAMINATION: GENERAL: Ill  appearing, comatose HEAD: [place]/AT EYE: sluggish pupils, negative doll's eyes THROAT/ORAL CAVITY: ETT in place NECK: normal CHEST/LUNG: no crackles or wheezes.  HEART: Regular S1 and S2 without murmur, rub or gallop. ABDOMEN: soft, NT, + BS EXTREMITIES: no edema MUSCULOSKELETAL: Normal tone SKIN: No rash NEUROLOGIC: No doll's eyes, no blink to threat, upward gaze, comatose even w any stim. Propofol off since this am.   LABS:  BASIC METABOLIC PROFILE Recent Labs  Lab 08-27-2017 1339 08-27-2017 1344 07/12/17 0251  NA 138 140 140  K 4.3 4.3 3.5  CL 103 102 110  CO2 19*  --  18*  BUN 23* 25* 25*  CREATININE 1.66* 1.40* 1.36*  GLUCOSE 194* 188* 89  CALCIUM 8.7*  --  7.6*  MG  --   --  2.0  PHOS  --   --  1.2*    Glucose Recent Labs  Lab 08-27-2017 1339 08-27-2017 1719 08-27-2017 2031 08-27-2017 2348 07/12/17 0404 07/12/17 0749  GLUCAP 186* 187* 93 86 77 84    Liver Enzymes Recent Labs  Lab 08-27-2017 2115  AST 64*  ALT 9*  ALKPHOS 54  BILITOT 0.6  ALBUMIN 2.9*    CBC Recent Labs  Lab 08-27-2017 1339 08-27-2017 1344 07/12/17 0251  WBC 17.2*  --  4.8  HGB 9.3* 9.2* 9.2*  HCT 30.2* 27.0* 27.8*  PLT 188  --  124*    COAGULATION STUDIES Recent Labs  Lab 08-27-2017 1339  INR 1.08  SEPSIS MARKERS Recent Labs  Lab 07/20/2017 1732 07/08/2017 2115 07/12/17 0005 07/12/17 0251  LATICACIDVEN  --  2.3* 4.0* 5.1*  PROCALCITON 0.17  --   --   --     ABG Recent Labs  Lab 06/29/2017 1416 07/10/2017 1533  PHART 7.289* 7.302*  PCO2ART 54.0* 44.6  PO2ART 398.0* 72.0*    Cardiac Enzymes Recent Labs  Lab 07/04/2017 2115 07/12/17 0251 07/12/17 0922  TROPONINI 0.12* 0.22* 0.14*    Imaging Ct Head Wo Contrast  Result Date: 07/12/2017 CLINICAL DATA:  Cardiac arrest, anoxic brain injury. History of hypertension, hyperlipidemia. EXAM: CT HEAD WITHOUT CONTRAST TECHNIQUE: Contiguous axial images were obtained from the base of the skull through the vertex without intravenous  contrast. COMPARISON:  CT HEAD Aug 08, 2016 FINDINGS: BRAIN: No intraparenchymal hemorrhage, mass effect nor midline shift. The ventricles and sulci are normal for age. Mildly indistinct gray-white matter differentiation. No acute large vascular territory infarcts. No abnormal extra-axial fluid collections. Basal cisterns are patent. VASCULAR: Mild calcific atherosclerosis of the carotid siphons. SKULL: No skull fracture. No significant scalp soft tissue swelling. SINUSES/ORBITS: Mild paranasal sinus mucosal thickening with air-fluid levels. Bilateral middle ear and mastoid effusions. Included ocular globes and orbital contents are non-suspicious. Status post RIGHT ocular lens implant. OTHER: Life-support lines in place. IMPRESSION: 1. 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 Electronically Signed   By: Awilda Metro M.D.   On: 06/25/2017 20:21   Ct Angio Chest Pe W Or Wo Contrast  Result Date: 06/22/2017 CLINICAL DATA:  77 y/o M; post cardiac arrest with respiratory distress. EXAM: CT ANGIOGRAPHY CHEST WITH CONTRAST TECHNIQUE: Multidetector CT imaging of the chest was performed using the standard protocol during bolus administration of intravenous contrast. Multiplanar CT image reconstructions and MIPs were obtained to evaluate the vascular anatomy. CONTRAST:  65 cc Isovue 370 COMPARISON:  07/19/2017 chest radiograph FINDINGS: Cardiovascular: Normal heart size. No pericardial effusion. Moderate calcific atherosclerosis of the coronary arteries. Normal caliber thoracic aorta and main pulmonary artery. Mild calcific atherosclerosis of the aorta. Satisfactory opacification of the pulmonary arteries. No pulmonary embolus identified. Mediastinum/Nodes: Endotracheal tube 3.5 cm above the carina. Enteric tube tip in the distal stomach. Normal thyroid gland. Calcified mediastinal lymph nodes compatible with sequelae of prior granulomatous disease. No  pneumomediastinum or mediastinal hematoma. Lungs/Pleura: Multiple areas of consolidation and ground-glass nodules in bronchovascular distribution greatest within the dependent upper and lower lobes bilaterally. No pleural effusion or pneumothorax. Upper Abdomen: Cysts in bilateral kidneys measuring up to 6.1 cm cyst in the upper pole of the left kidney. Musculoskeletal: Acute minimally displaced right lateral seventh rib fracture. No other fracture identified. Multilevel degenerative changes of the spine. Subcutaneous emphysema present throughout the left upper chest wall and upper extremity. Review of the MIP images confirms the above findings. IMPRESSION: 1. No pulmonary embolus identified. 2. Multiple areas of consolidation greatest within the dependent upper and lower lobes which may represent multifocal pneumonia or possibly aspiration given cardiac arrest. 3. Acute minimally displaced fracture of the right lateral seventh rib. No pneumothorax. 4. Coronary and aortic calcific atherosclerosis. Electronically Signed   By: Mitzi Hansen M.D.   On: 07/09/2017 20:31   Dg Chest Port 1 View  Result Date: 07/12/2017 CLINICAL DATA:  Aspiration pneumonia, acute respiratory failure, anoxic encephalopathy. EXAM: PORTABLE CHEST 1 VIEW COMPARISON:  Portable chest and chest CTA dated 07/12/2017 FINDINGS: Endotracheal tube in satisfactory position. Nasogastric tube extending into the stomach. Normal sized heart. Mild bibasilar airspace opacity  as well as increased opacity in the left lower lobe. Displaced right 7th rib fracture. No pneumothorax. IMPRESSION: 1. Interval mild bibasilar atelectasis and more pronounced left lower lobe atelectasis, pneumonia or aspiration pneumonitis. 2. Stable displaced right 7th rib fracture. Electronically Signed   By: Beckie Salts M.D.   On: 07/12/2017 09:02   Dg Chest Portable 1 View  Result Date: 07/08/2017 CLINICAL DATA:  Check OG placement EXAM: PORTABLE CHEST 1 VIEW  COMPARISON:  Film from earlier in the same day. FINDINGS: Cardiac shadow is stable. Endotracheal tube and gastric catheter are noted in satisfactory position. Lungs are clear without focal infiltrate or effusion. Some persistent subcutaneous emphysema is noted in the region of the left shoulder. Right seventh rib fracture is noted somewhat better appreciated on the current exam. IMPRESSION: Gastric catheter in satisfactory position. Right rib fracture somewhat better seen on the current exam. Stable subcutaneous emphysema about the left shoulder. This may be related to the recent arrest. Electronically Signed   By: Alcide Clever M.D.   On: 07/20/2017 14:55   Dg Chest Portable 1 View  Result Date: 07/10/2017 CLINICAL DATA:  Check endotracheal tube placement EXAM: PORTABLE CHEST 1 VIEW COMPARISON:  11/09/2016 FINDINGS: Cardiac shadow is within normal limits. Endotracheal tube is noted in satisfactory position 2.7 cm above the carina. Azygos lobe is noted. No focal infiltrate or sizable effusion is seen. No acute bony abnormality is seen. Some subcutaneous emphysema is noted in the region of the left clavicle and left shoulder. This is of uncertain significance. No pneumothorax is seen. IMPRESSION: Endotracheal tube as described. Mild subcutaneous emphysema in the region of the left shoulder without definitive pneumothorax or other abnormality. This is of uncertain significance. Continued follow-up is recommended. Electronically Signed   By: Alcide Clever M.D.   On: 07/06/2017 14:40    SIGNIFICANT EVENTS: 4/20: Cardiac arrest at home with immediate initiation of CPR, required prolonged CPR.  Intubated in the field.  LINES/TUBES: 4/20: Endotracheal intubation in the field   ASSESSMENT / PLAN: Principal Problem:   Acute respiratory failure with hypercapnia (HCC) Active Problems:   Parkinson's disease (HCC)   Normocytic anemia   Anoxic encephalopathy (HCC)   Aspiration pneumonia (HCC)   Rib fracture    Acute kidney injury (HCC)   Lactic acidosis   By systems:  NEUROLOGIC  Anoxic encephalopathy  Myoclonus  Parkinson's disease Continue supportive care pending EEG, MRI and Neurology eval and recommendations regarding prognosis. Would appear to be very poor  Family has not wanted to change goals of care until more neuro information available.  Valproate, Keppra as ordered  PULMONARY  Acute hypercapnic respiratory failure  Aspiration pneumonia  Right seventh rib fracture, likely secondary to chest compressions: No evidence of pneumothorax Continue current vent support. Not a candidate for extubation due to MS. He did have a spontaneous resp effort on my eval 4/21.  Follow CXR to insure no evolving PTX Empiric vanco + zosyn started for suspected aspiration PNA. Consider narrow 4/22   CARDIOVASCULAR  Shock, unknown etiology Wean pressors as able.  abx as below  RENAL  Acute kidney injury, ATN  Lactic acidosis Follow BMP and UOP with resuscitation, volume.   GASTROINTESTINAL  Emesis  HEMATOLOGIC  Normocytic anemia  Leukocytosis Follow CBC  ENDOCRINE  Hypothyroidism Continue synthroid    DVT PROPHYLAXIS: Heparin GI PROPHYLAXIS: Protonix  FAMILY  - Updates: daughter updated at bedside by Dr Delton Coombes 4/21  - Inter-disciplinary family meet or Palliative Care meeting due by 07/17/2017  Independent CC time 33 minutes  Levy Pupa, MD, PhD 07/12/2017, 3:20 PM [facility] [phone] or if no answer [phone]

## 2017-07-12 NOTE — Plan of Care (Signed)
  Problem: Clinical Measurements: Goal: Will remain free from infection Outcome: Not Progressing Goal: Diagnostic test results will improve Outcome: Not Progressing Goal: Respiratory complications will improve Outcome: Not Progressing   

## 2017-07-13 LAB — CULTURE, RESPIRATORY W GRAM STAIN: Culture: NORMAL

## 2017-07-14 LAB — URINE CULTURE: Culture: 30000 — AB

## 2017-07-15 ENCOUNTER — Telehealth: Payer: Self-pay

## 2017-07-15 NOTE — Telephone Encounter (Signed)
On 07/15/17 I received a d/c from [facility] The d/c is for cremation. The patient is a patient of Doctor Byrum. The d/c will be taken to Pulmonary Unit for signature.  On 07/16/17 I received the d/c back from Doctor Craige CottaSood who signed the d/c for Doctor Byrum. I got the d/c ready and called the funeral home to let them know the d/c is ready for pickup.  I also faxed a copy to the funeral home per the funeral home request.

## 2017-07-22 NOTE — Procedures (Signed)
LTM-EEG Report  HISTORY: Continuous video-EEG monitoring performed for 77 year old with anoxic injury, myoclonus, altered mental status.  ACQUISITION: International 10-20 system for electrode placement; 18 channels with additional eyes linked to ipsilateral ears and EKG. Additional T1-T2 electrodes were used. Continuous video recording obtained.   EEG NUMBER:  MEDICATIONS:  Day 1: PHT  DAY #1: from 1732 07/12/17 to 2300 07/12/17  BACKGROUND: An overall medium voltage continuous recording with poor spontaneous variability and reactivity. The background consisted of medium voltage 1-6Hz  activity bilaterally with no evidence of a posterior dominant rhythm. No clear reactivity or sleep architecture was seen.  EPILEPTIFORM/PERIODIC ACTIVITY: There were frequent bifrontal 1-1.5Hz  periodic discharges (GPDs) or GRDA without ictal evolution during this recording. SEIZURES: none  EVENTS: none   EKG: no significant arrhythmia  SUMMARY: This was an abnormal continuous video EEG due to background slowing, loss of normal physiologic features, and GPDs/GRDA. No seizures were seen. The patient expired and the recording was completed.

## 2017-07-22 NOTE — Progress Notes (Signed)
Patient expired. Verified with Jonny RuizJohn, RN. Family at bedside. Dr. Allena KatzPatel made aware.

## 2017-07-22 DEATH — deceased

## 2017-08-22 NOTE — Discharge Summary (Signed)
DEATH SUMMARY [facility] Pulmonary/Critical Care Medicine   Name: Bryan Vega MRN: [id number] DOB: 05/11/1940 77 y.o.  Date of Admission: 06/29/2017  1:23 PM Date of Discharge: 07-28-2017 Attending Physician: Leslye Peer, MD  Discharge Diagnosis: Principal Problem:   Acute respiratory failure with hypercapnia (HCC) Active Problems:   Parkinson's disease (HCC)   Normocytic anemia   Anoxic encephalopathy (HCC)   Aspiration pneumonia (HCC)   Rib fracture   Acute kidney injury (HCC)   Lactic acidosis   Cause of death: Anoxic Brain Injury secondary to cardiac arrest and prolonged CPR Time of death: 12:22 AM  Disposition and follow-up:   Mr.Maxten Pompey was discharged from [facility] in expired condition.    Hospital Course: 77 y.o. [place] male reformed smoker presented to the [facility] Emergency Department via EMS with cardiac arrest.  The patient experienced a witnessed cardiac arrest at home.  The patient's wife saw the patient slumped over in the couch.  The patient's son Building control surveyor) was at home and immediately came to his side and initiated CPR.  911 was called.  The patient's son reports he was unsuccessful in achieving ROSC. EMS intubated the patient and continued CPR.  In the emergency department, the patient demonstrated sinus tachycardia which degraded to bradycardia arrhythmia.  He was started on epinephrine infusion.  At the time of clinical interview, the patient was intubated and on mechanical ventilatory support (synchronous with the ventilator despite acid pH) and fentanyl infusion. His clinical status deteriorated further with hypotension, tachycardia, and severe anoxic brain injury. Dr. Delton Coombes discussed with family that aggressive invasive interventions or pressors were unlikely to provide any benefit. Code status was changed to DNR. Family decided to pursue full comfort care and one way  extubation on 4/21 PM and patient expired on 4/22 12:22 AM.  Signed: Darreld Mclean, MD  Internal Medicine PGY-3 08/05/2017, 11:32 PM

## 2018-07-15 IMAGING — CT CT HEAD W/O CM
4 series · 15 of 47 positions shown, 17 images · non-contrast
Comparison: CT HEAD August 08, 2016

CLINICAL DATA: Cardiac arrest, anoxic brain injury. History of
hypertension, hyperlipidemia.

EXAM:
CT HEAD WITHOUT CONTRAST
TECHNIQUE: Contiguous axial images were obtained from the base of the skull
through the vertex without intravenous contrast.

[Series 3: head wo · axial · 0.46mm/px · z∈[-96,+30]mm · 7 of 35 slices shown, 9 images]
[im 5/35  brain]
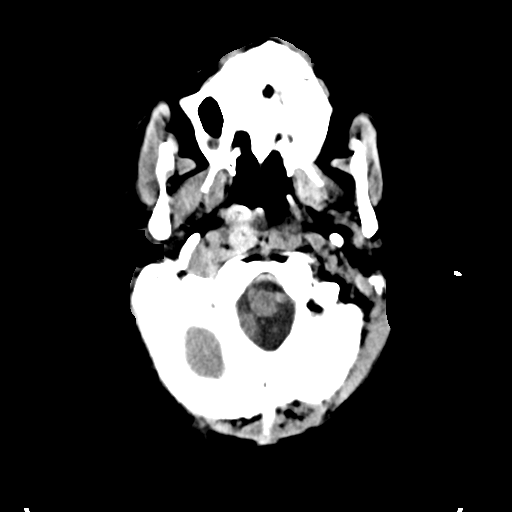
[im 5/35  bone]
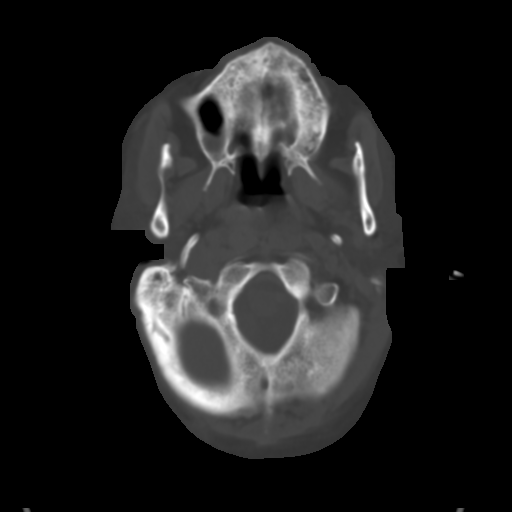
[im 9/35  brain]
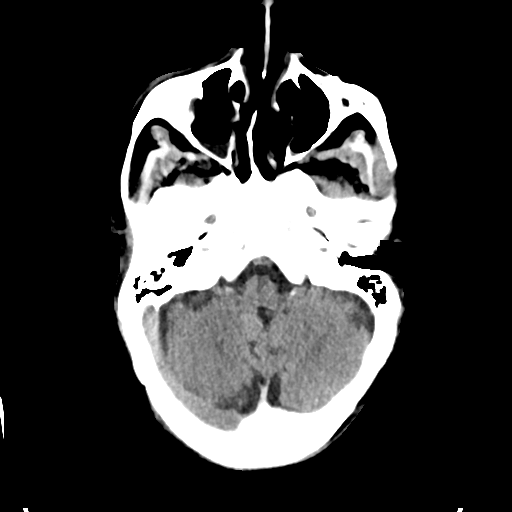
[im 13/35  brain]
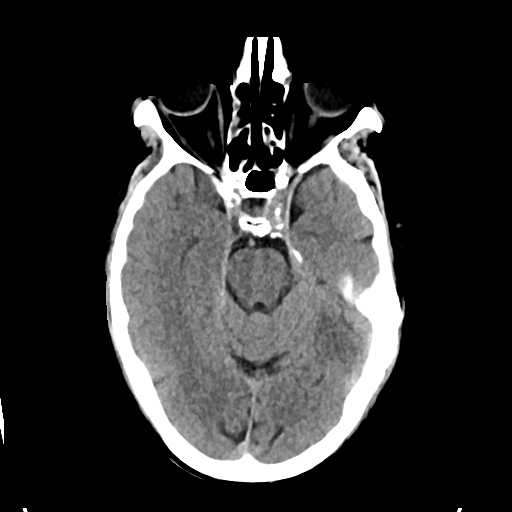
[im 18/35  brain]
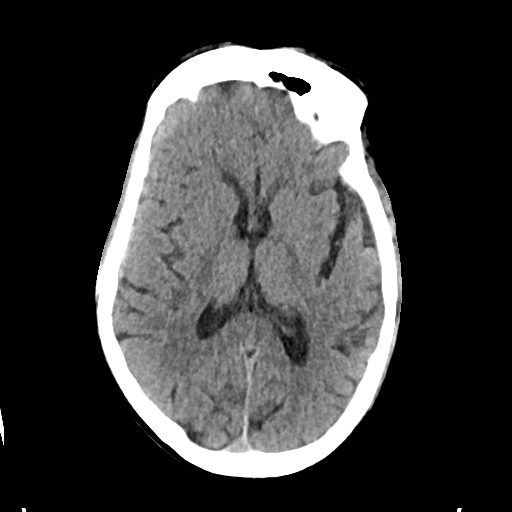
[im 22/35  brain]
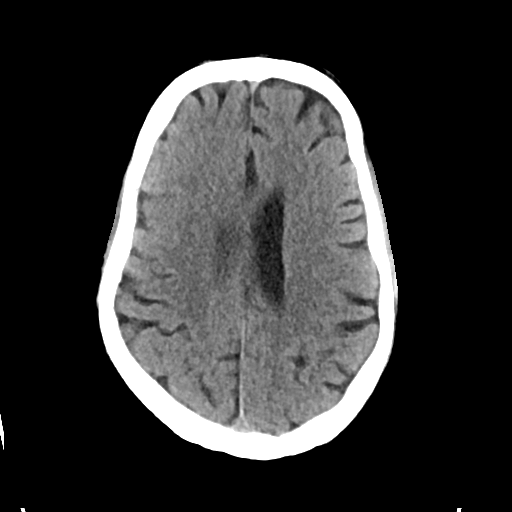
[im 22/35  bone]
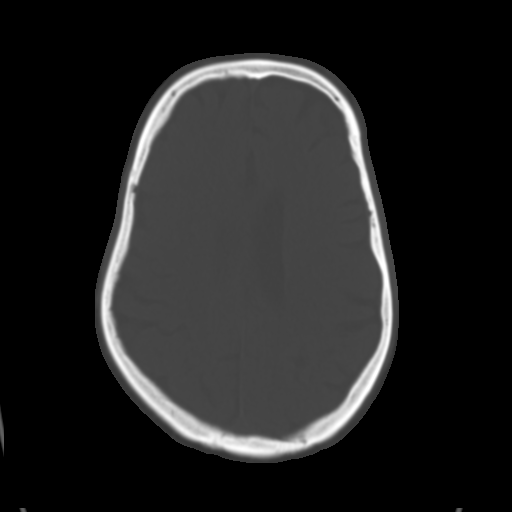
[im 26/35  brain]
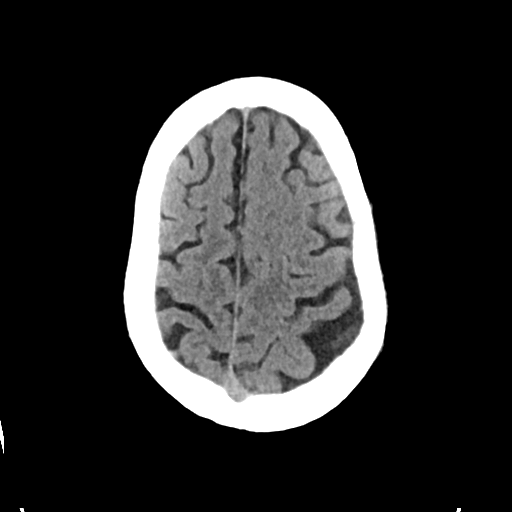
[im 30/35  brain]
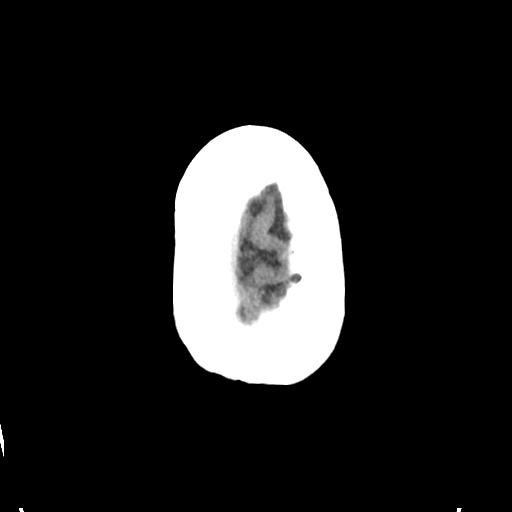

[Series 4: head bone · axial · 0.46mm/px · z∈[-100,-82]mm · 2 of 86 slices shown]
[im 9/86  bone]
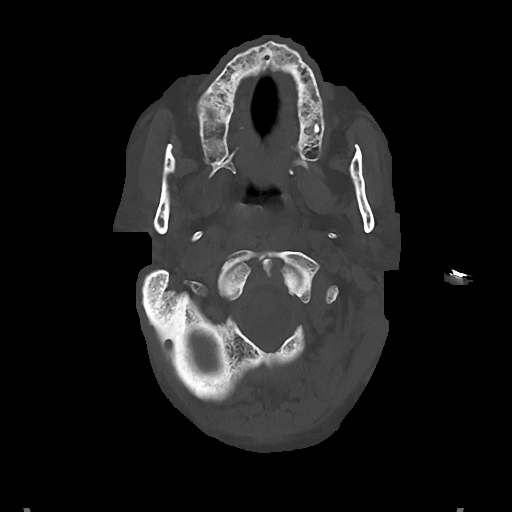
[im 18/86  bone]
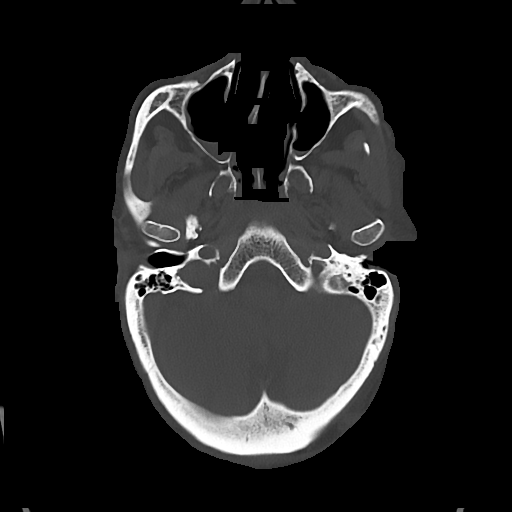

[Series 5: cor soft · coronal · 0.34mm/px · 3 of 68 slices shown]
[im 23/68  brain]
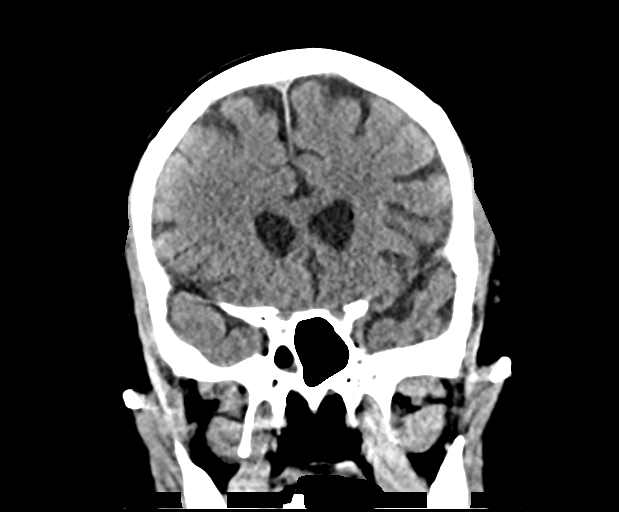
[im 30/68  brain]
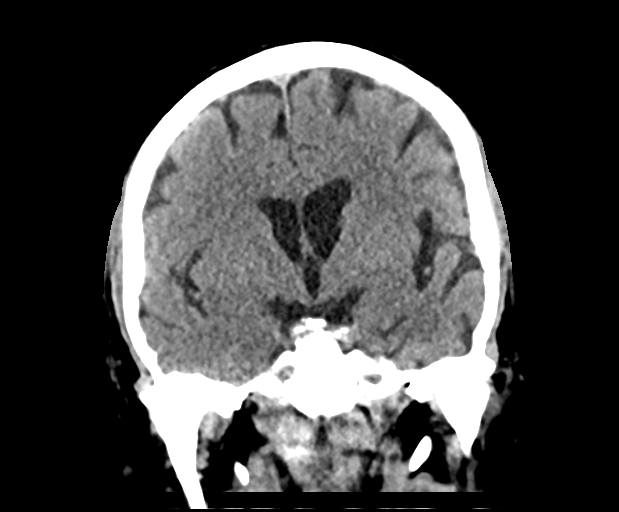
[im 38/68  brain]
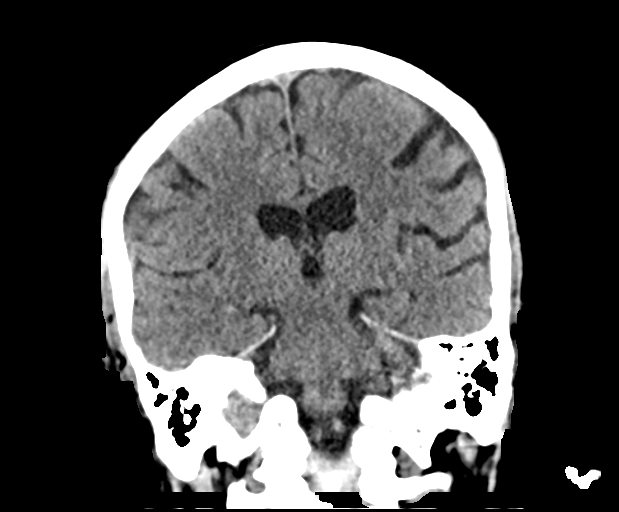

[Series 6: sag soft · sagittal · 0.33mm/px · 3 of 52 slices shown]
[im 18/52  brain]
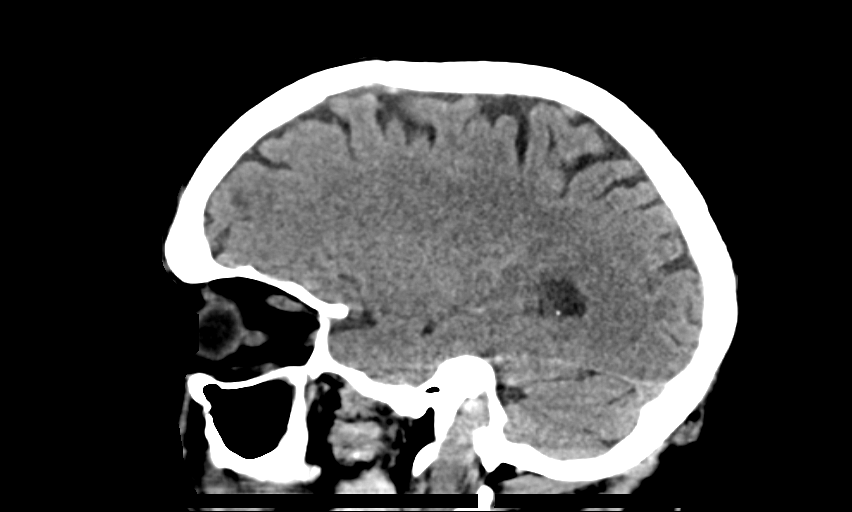
[im 26/52  brain]
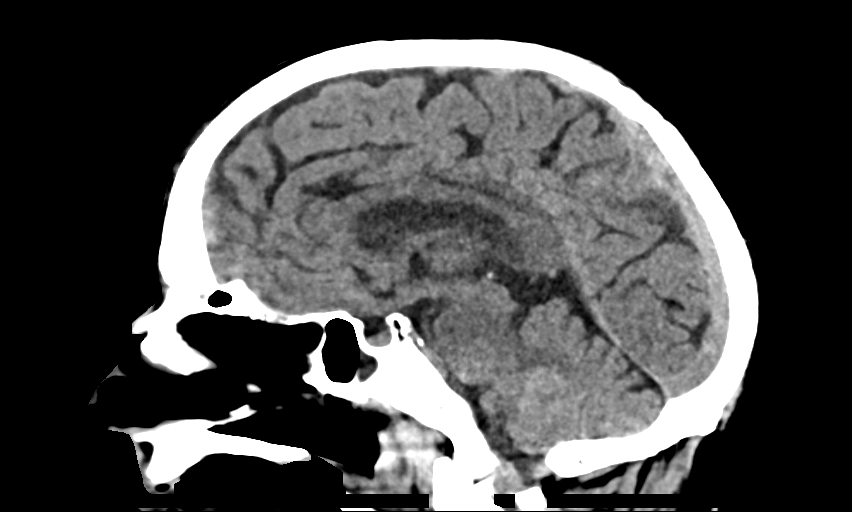
[im 35/52  brain]
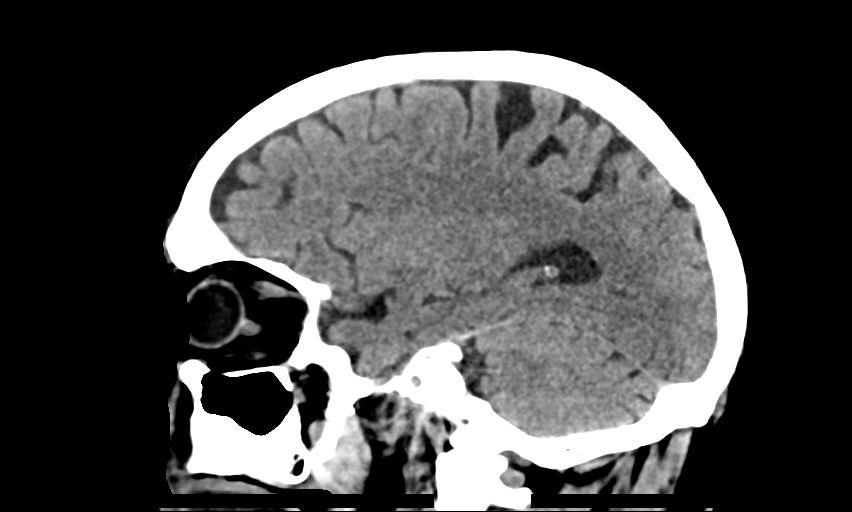

[15 of 47 positions shown; findings below may reference images not displayed]

FINDINGS: BRAIN: No intraparenchymal hemorrhage, mass effect nor midline
shift. The ventricles and sulci are normal for age. Mildly
indistinct gray-white matter differentiation. No acute large
vascular territory infarcts. No abnormal extra-axial fluid
collections. Basal cisterns are patent.

VASCULAR: Mild calcific atherosclerosis of the carotid siphons.

SKULL: No skull fracture. No significant scalp soft tissue swelling.

SINUSES/ORBITS: Mild paranasal sinus mucosal thickening with
air-fluid levels. Bilateral middle ear and mastoid effusions.
Included ocular globes and orbital contents are non-suspicious.
Status post RIGHT ocular lens implant.

OTHER: Life-support lines in place.
IMPRESSION: 1. Mildly indistinct gray-white matter differentiation concerning
for hypoxic ischemic encephalopathy.
